# Patient Record
Sex: Male | Born: 1960 | Race: White | Hispanic: No | Marital: Married | Smoking: Current every day smoker
Health system: Southern US, Community
[De-identification: ages and names within clinical notes are randomized; demographics above are authoritative.]

## PROBLEM LIST (undated history)

## (undated) DIAGNOSIS — J449 Chronic obstructive pulmonary disease, unspecified: Secondary | ICD-10-CM

## (undated) DIAGNOSIS — M1990 Unspecified osteoarthritis, unspecified site: Secondary | ICD-10-CM

## (undated) DIAGNOSIS — I2510 Atherosclerotic heart disease of native coronary artery without angina pectoris: Secondary | ICD-10-CM

## (undated) DIAGNOSIS — K219 Gastro-esophageal reflux disease without esophagitis: Secondary | ICD-10-CM

## (undated) DIAGNOSIS — E119 Type 2 diabetes mellitus without complications: Secondary | ICD-10-CM

## (undated) HISTORY — PX: JOINT REPLACEMENT: SHX530

---
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
## 2016-12-29 DIAGNOSIS — M25552 Pain in left hip: Secondary | ICD-10-CM | POA: Diagnosis not present

## 2018-01-06 DIAGNOSIS — S300XXA Contusion of lower back and pelvis, initial encounter: Secondary | ICD-10-CM | POA: Diagnosis not present

## 2018-01-06 DIAGNOSIS — S3992XA Unspecified injury of lower back, initial encounter: Secondary | ICD-10-CM | POA: Diagnosis not present

## 2018-01-21 DIAGNOSIS — E109 Type 1 diabetes mellitus without complications: Secondary | ICD-10-CM | POA: Diagnosis not present

## 2018-03-01 DIAGNOSIS — M25559 Pain in unspecified hip: Secondary | ICD-10-CM | POA: Diagnosis not present

## 2018-03-01 DIAGNOSIS — E109 Type 1 diabetes mellitus without complications: Secondary | ICD-10-CM | POA: Diagnosis not present

## 2018-03-01 DIAGNOSIS — K210 Gastro-esophageal reflux disease with esophagitis: Secondary | ICD-10-CM | POA: Diagnosis not present

## 2018-03-01 DIAGNOSIS — E119 Type 2 diabetes mellitus without complications: Secondary | ICD-10-CM | POA: Diagnosis not present

## 2018-03-01 DIAGNOSIS — J309 Allergic rhinitis, unspecified: Secondary | ICD-10-CM | POA: Diagnosis not present

## 2018-05-28 ENCOUNTER — Encounter (HOSPITAL_COMMUNITY): Payer: Self-pay | Admitting: Cardiology

## 2018-05-28 ENCOUNTER — Inpatient Hospital Stay (HOSPITAL_COMMUNITY)
Admission: AD | Admit: 2018-05-28 | Discharge: 2018-05-30 | DRG: 287 | Disposition: A | Discharge status: Home / Self Care | Payer: Medicaid Other | Source: Other Acute Inpatient Hospital | Attending: Cardiology | Admitting: Cardiology

## 2018-05-28 ENCOUNTER — Other Ambulatory Visit: Payer: Self-pay

## 2018-05-28 DIAGNOSIS — E119 Type 2 diabetes mellitus without complications: Secondary | ICD-10-CM

## 2018-05-28 DIAGNOSIS — Z8249 Family history of ischemic heart disease and other diseases of the circulatory system: Secondary | ICD-10-CM | POA: Diagnosis not present

## 2018-05-28 DIAGNOSIS — D72829 Elevated white blood cell count, unspecified: Secondary | ICD-10-CM | POA: Diagnosis not present

## 2018-05-28 DIAGNOSIS — Z79891 Long term (current) use of opiate analgesic: Secondary | ICD-10-CM

## 2018-05-28 DIAGNOSIS — I200 Unstable angina: Secondary | ICD-10-CM | POA: Diagnosis present

## 2018-05-28 DIAGNOSIS — R079 Chest pain, unspecified: Secondary | ICD-10-CM | POA: Diagnosis not present

## 2018-05-28 DIAGNOSIS — Z79899 Other long term (current) drug therapy: Secondary | ICD-10-CM | POA: Diagnosis not present

## 2018-05-28 DIAGNOSIS — I249 Acute ischemic heart disease, unspecified: Secondary | ICD-10-CM

## 2018-05-28 DIAGNOSIS — I25110 Atherosclerotic heart disease of native coronary artery with unstable angina pectoris: Secondary | ICD-10-CM | POA: Diagnosis not present

## 2018-05-28 DIAGNOSIS — Z794 Long term (current) use of insulin: Secondary | ICD-10-CM

## 2018-05-28 DIAGNOSIS — I2510 Atherosclerotic heart disease of native coronary artery without angina pectoris: Secondary | ICD-10-CM | POA: Diagnosis not present

## 2018-05-28 DIAGNOSIS — K219 Gastro-esophageal reflux disease without esophagitis: Secondary | ICD-10-CM | POA: Diagnosis not present

## 2018-05-28 DIAGNOSIS — R0789 Other chest pain: Secondary | ICD-10-CM | POA: Diagnosis not present

## 2018-05-28 DIAGNOSIS — E871 Hypo-osmolality and hyponatremia: Secondary | ICD-10-CM

## 2018-05-28 DIAGNOSIS — R51 Headache: Secondary | ICD-10-CM | POA: Diagnosis not present

## 2018-05-28 DIAGNOSIS — IMO0001 Reserved for inherently not codable concepts without codable children: Secondary | ICD-10-CM

## 2018-05-28 DIAGNOSIS — E1165 Type 2 diabetes mellitus with hyperglycemia: Secondary | ICD-10-CM | POA: Diagnosis present

## 2018-05-28 HISTORY — DX: Type 2 diabetes mellitus without complications: E11.9

## 2018-05-28 HISTORY — DX: Gastro-esophageal reflux disease without esophagitis: K21.9

## 2018-05-28 HISTORY — DX: Unstable angina: I20.0

## 2018-05-28 HISTORY — DX: Atherosclerotic heart disease of native coronary artery without angina pectoris: I25.10

## 2018-05-28 LAB — CBC
HCT: 44.1 % (ref 39.0–52.0)
Hemoglobin: 15.5 g/dL (ref 13.0–17.0)
MCH: 32.4 pg (ref 26.0–34.0)
MCHC: 35.1 g/dL (ref 30.0–36.0)
MCV: 92.1 fL (ref 80.0–100.0)
Platelets: 297 10*3/uL (ref 150–400)
RBC: 4.79 MIL/uL (ref 4.22–5.81)
RDW: 12.8 % (ref 11.5–15.5)
WBC: 10.2 10*3/uL (ref 4.0–10.5)
nRBC: 0 % (ref 0.0–0.2)

## 2018-05-28 LAB — GLUCOSE, CAPILLARY
Glucose-Capillary: 134 mg/dL — ABNORMAL HIGH (ref 70–99)
Glucose-Capillary: 263 mg/dL — ABNORMAL HIGH (ref 70–99)

## 2018-05-28 LAB — TROPONIN I: Troponin I: <0.03 ng/mL (ref <0.03)

## 2018-05-28 LAB — COMPREHENSIVE METABOLIC PANEL
ALT: 13 U/L (ref 0–44)
AST: 15 U/L (ref 15–41)
Albumin: 3.4 g/dL — ABNORMAL LOW (ref 3.5–5.0)
Alkaline Phosphatase: 78 U/L (ref 38–126)
Anion gap: 11 (ref 5–15)
BUN: 9 mg/dL (ref 6–20)
CO2: 20 mmol/L — ABNORMAL LOW (ref 22–32)
Calcium: 8.8 mg/dL — ABNORMAL LOW (ref 8.9–10.3)
Chloride: 101 mmol/L (ref 98–111)
Creatinine, Ser: 1.09 mg/dL (ref 0.61–1.24)
GFR calc Af Amer: >60 mL/min (ref >60)
GFR calc non Af Amer: >60 mL/min (ref >60)
Glucose, Bld: 145 mg/dL — ABNORMAL HIGH (ref 70–99)
Potassium: 4.1 mmol/L (ref 3.5–5.1)
Sodium: 132 mmol/L — ABNORMAL LOW (ref 135–145)
Total Bilirubin: 1.1 mg/dL (ref 0.3–1.2)
Total Protein: 6.6 g/dL (ref 6.5–8.1)

## 2018-05-28 LAB — HEMOGLOBIN A1C
Hgb A1c MFr Bld: 8.1 % — ABNORMAL HIGH (ref 4.8–5.6)
Mean Plasma Glucose: 185.77 mg/dL

## 2018-05-28 LAB — MAGNESIUM: Magnesium: 1.9 mg/dL (ref 1.7–2.4)

## 2018-05-28 LAB — PROTIME-INR
INR: 1.01
Prothrombin Time: 13.2 seconds (ref 11.4–15.2)

## 2018-05-28 LAB — BRAIN NATRIURETIC PEPTIDE: B Natriuretic Peptide: 25 pg/mL (ref 0.0–100.0)

## 2018-05-28 MED ORDER — PANTOPRAZOLE SODIUM 40 MG PO TBEC
40.0000 mg | DELAYED_RELEASE_TABLET | Freq: Every day | ORAL | Status: DC
  Administered 2018-05-29 – 2018-05-30 (×2): 40 mg via ORAL
  Filled 2018-05-28 (×3): qty 1

## 2018-05-28 MED ORDER — MORPHINE SULFATE (PF) 2 MG/ML IV SOLN
2.0000 mg | INTRAVENOUS | Status: DC | PRN
  Administered 2018-05-28 – 2018-05-30 (×10): 2 mg via INTRAVENOUS
  Filled 2018-05-28 (×10): qty 1

## 2018-05-28 MED ORDER — ATORVASTATIN CALCIUM 80 MG PO TABS
80.0000 mg | ORAL_TABLET | Freq: Every day | ORAL | Status: DC
  Administered 2018-05-28 – 2018-05-30 (×3): 80 mg via ORAL
  Filled 2018-05-28 (×3): qty 1

## 2018-05-28 MED ORDER — NITROGLYCERIN IN D5W 200-5 MCG/ML-% IV SOLN
2.0000 ug/min | INTRAVENOUS | Status: DC
  Administered 2018-05-28: 35 ug/min via INTRAVENOUS
  Administered 2018-05-28: 30 ug/min via INTRAVENOUS
  Administered 2018-05-29: 35 ug/min via INTRAVENOUS
  Filled 2018-05-28: qty 250

## 2018-05-28 MED ORDER — ATORVASTATIN CALCIUM 80 MG PO TABS: 80.0000 mg | ORAL_TABLET | Freq: Every day | ORAL | Status: DC

## 2018-05-28 MED ORDER — OXYCODONE HCL 5 MG PO TABS: 5.0000 mg | ORAL_TABLET | ORAL | Status: DC | PRN

## 2018-05-28 MED ORDER — NITROGLYCERIN 0.4 MG SL SUBL: 0.4000 mg | SUBLINGUAL_TABLET | SUBLINGUAL | Status: DC | PRN

## 2018-05-28 MED ORDER — ASPIRIN EC 81 MG PO TBEC
81.0000 mg | DELAYED_RELEASE_TABLET | Freq: Every day | ORAL | Status: DC
  Administered 2018-05-29 – 2018-05-30 (×2): 81 mg via ORAL
  Filled 2018-05-28 (×3): qty 1

## 2018-05-28 MED ORDER — INSULIN ASPART 100 UNIT/ML ~~LOC~~ SOLN
4.0000 [IU] | Freq: Three times a day (TID) | SUBCUTANEOUS | Status: DC
  Administered 2018-05-29 (×2): 4 [IU] via SUBCUTANEOUS

## 2018-05-28 MED ORDER — HEPARIN (PORCINE) 25000 UT/250ML-% IV SOLN
1300.0000 [IU]/h | INTRAVENOUS | Status: DC
  Administered 2018-05-28 – 2018-05-29 (×2): 1000 [IU]/h via INTRAVENOUS
  Administered 2018-05-30: 1200 [IU]/h via INTRAVENOUS
  Filled 2018-05-28 (×2): qty 250

## 2018-05-28 MED ORDER — ONDANSETRON HCL 4 MG/2ML IJ SOLN: 4.0000 mg | Freq: Four times a day (QID) | INTRAMUSCULAR | Status: DC | PRN

## 2018-05-28 MED ORDER — INSULIN GLARGINE 100 UNIT/ML ~~LOC~~ SOLN
18.0000 [IU] | Freq: Every day | SUBCUTANEOUS | Status: DC
  Administered 2018-05-28 – 2018-05-29 (×2): 18 [IU] via SUBCUTANEOUS
  Filled 2018-05-28 (×3): qty 0.18

## 2018-05-28 MED ORDER — INSULIN ASPART 100 UNIT/ML ~~LOC~~ SOLN
0.0000 [IU] | Freq: Every day | SUBCUTANEOUS | Status: DC
  Administered 2018-05-28: 3 [IU] via SUBCUTANEOUS

## 2018-05-28 MED ORDER — METOPROLOL TARTRATE 25 MG PO TABS
25.0000 mg | ORAL_TABLET | Freq: Two times a day (BID) | ORAL | Status: DC
  Administered 2018-05-29 – 2018-05-30 (×3): 25 mg via ORAL
  Filled 2018-05-28 (×3): qty 1

## 2018-05-28 MED ORDER — ACETAMINOPHEN 325 MG PO TABS
650.0000 mg | ORAL_TABLET | ORAL | Status: DC | PRN
  Administered 2018-05-29 (×2): 650 mg via ORAL
  Filled 2018-05-28 (×2): qty 2

## 2018-05-28 MED ORDER — HEPARIN BOLUS VIA INFUSION
4000.0000 [IU] | Freq: Once | INTRAVENOUS | Status: AC
  Administered 2018-05-28: 4000 [IU] via INTRAVENOUS
  Filled 2018-05-28: qty 4000

## 2018-05-28 MED ORDER — INSULIN ASPART 100 UNIT/ML ~~LOC~~ SOLN
0.0000 [IU] | Freq: Three times a day (TID) | SUBCUTANEOUS | Status: DC
  Administered 2018-05-29: 11 [IU] via SUBCUTANEOUS
  Administered 2018-05-29: 8 [IU] via SUBCUTANEOUS
  Administered 2018-05-29: 2 [IU] via SUBCUTANEOUS
  Administered 2018-05-30: 5 [IU] via SUBCUTANEOUS

## 2018-05-28 NOTE — H&P (Signed)
Cardiology Admission History and Physical:   Patient ID: Gabriel Gomez MRN: [id number]; DOB: 11/01/1960   Admission date: 05/28/2018  Primary Care Provider: Patient, No Pcp Per Primary Cardiologist: None  Chief Complaint:  Chest pain  Patient Profile:   Gabriel Gomez is a 58 y.o. male with a history of CAD, DM, GERD who presents with chest pain concerning for UA.   History of Present Illness:   Gabriel Gomez is a 58 y.o. male with a history of CAD, DM, GERD who presents with chest pain concerning for UA.   The patient has a history of CAD and had LHC 10 years ago that showed "90% blockage" that was medically managed without intervention. He has not followed with a cardiologist since. He reports that he has been experiencing intermittent exertional chest pain with dyspnea over the past several months. He was otherwise at his baseline until yesterday, when he developed L sided chest pressure when walking to the mailbox. He also experienced tingling in his L face, but denied diaphoreses, nausea, edema or other symptoms. His symptoms improved but then woke him from sleep this morning, so he came to the ED today.   He presented to the [facility] ED, where he had BP 159/90, HR 88. ECG showed NSR with septal Q waves and nonspecific ST changes. Labs included WBC 11, Cr 0.8, BNP 113, negative troponin. CXR and head CT were performed and were unremarkable. He continued to have chest pain at the OSH, so NTG gtt and heparin gtt and was started due to concern for UA. He was transferred to [facility] for further care.   On arrival to [facility], pt with BP 105/69. ECG showed NSR with LAD, septal Q waves, poor R wave progression and nonspecific ST changes.  He reported mild ongoing chest pain on 25 mcg of NTG. He states that the tingling in his face and arm are still present but improved. He denies other complaints.   Of note, he recently had hip surgery, which he tolerated well. He is on oxycodone for  ongoing pain control.   Past Medical History:  Diagnosis Date  . Coronary artery disease   . Diabetes mellitus without complication ([place])   . GERD (gastroesophageal reflux disease)        Medications Prior to Admission: Prior to Admission medications   Medication Sig Start Date End Date Taking? Authorizing Provider  insulin aspart (NOVOLOG) 100 UNIT/ML injection Inject into the skin 3 (three) times daily before meals.   Yes [provider]  insulin glargine (LANTUS) 100 UNIT/ML injection Inject 25 Units into the skin at bedtime.   Yes [provider]  omeprazole (PRILOSEC) 20 MG capsule Take 20 mg by mouth daily.   Yes [provider]  oxyCODONE (OXY IR/ROXICODONE) 5 MG immediate release tablet Take 10 mg by mouth 2 (two) times daily as needed for severe pain.   Yes [provider]     Allergies:   Allergies not on file  Social History:   Social History   Socioeconomic History  . Marital status: Married    Spouse name: Not on file  . Number of children: Not on file  . Years of education: Not on file  . Highest education level: Not on file  Occupational History  . Not on file  Social Needs  . Financial resource strain: Not on file  . Food insecurity:    Worry: Not on file    Inability: Not on file  . Transportation needs:  Medical: Not on file    Non-medical: Not on file  Tobacco Use  . Smoking status: Current Every Day Smoker  Substance and Sexual Activity  . Alcohol use: Not on file  . Drug use: Not on file  . Sexual activity: Not on file  Lifestyle  . Physical activity:    Days per week: Not on file    Minutes per session: Not on file  . Stress: Not on file  Relationships  . Social connections:    Talks on phone: Not on file    Gets together: Not on file    Attends religious service: Not on file    Active member of club or organization: Not on file    Attends meetings of clubs or organizations: Not on file     Relationship status: Not on file  . Intimate partner violence:    Fear of current or ex partner: Not on file    Emotionally abused: Not on file    Physically abused: Not on file    Forced sexual activity: Not on file  Other Topics Concern  . Not on file  Social History Narrative  . Not on file    Family History:   The patient's family history includes CAD in his father.  Diagnosed in his 74s  ROS:  Please see the history of present illness.  All other ROS reviewed and negative.     Physical Exam/Data:   Vitals:   05/28/18 1702  BP: 105/69  Pulse: 81  Temp: 98 F (36.7 C)  TempSrc: Oral  SpO2: 99%  Weight: 84.1 kg  Height: 5\' 9"  (1.753 m)   No intake or output data in the 24 hours ending 05/28/18 1820 Last 3 Weights 05/28/2018  Weight (lbs) 185 lb 6.5 oz  Weight (kg) 84.1 kg     Body mass index is 27.38 kg/m.  General:  Well nourished, well developed, in no acute distress HEENT: normal Neck: no JVD Cardiac:  normal S1, S2; RRR; no murmur  Lungs:  clear to auscultation bilaterally, no wheezing, rhonchi or rales  Abd: soft, nontender, no hepatomegaly  Ext: no edema Musculoskeletal:  No deformities, BUE and BLE strength normal and equal Skin: warm and dry  Neuro:  Equal sensation of bilateral face. Normal strength and sensation in UE bilaterally. No focal abnormalities noted Psych:  Normal affect    EKG:  The ECG that was done and was personally reviewed and demonstrates NSR with LAD, septal Q waves, poor R wave progression and nonspecific ST changes  Relevant CV Studies: None in our system  Laboratory Data:  ChemistryNo results for input(s): NA, K, CL, CO2, GLUCOSE, BUN, CREATININE, CALCIUM, GFRNONAA, GFRAA, ANIONGAP in the last 168 hours.  No results for input(s): PROT, ALBUMIN, AST, ALT, ALKPHOS, BILITOT in the last 168 hours. Hematology Recent Labs  Lab 05/28/18 1737  WBC 10.2  RBC 4.79  HGB 15.5  HCT 44.1  MCV 92.1  MCH 32.4  MCHC 35.1  RDW 12.8    PLT 297   Cardiac EnzymesNo results for input(s): TROPONINI in the last 168 hours. No results for input(s): TROPIPOC in the last 168 hours.  BNPNo results for input(s): BNP, PROBNP in the last 168 hours.  DDimer No results for input(s): DDIMER in the last 168 hours.  Radiology/Studies:  No results found.  Assessment and Plan:   CAD Chest pain The patient has a history of CAD based on LHC many years ago. He has experienced progressive exertional  angina over the past months. He now has an episode of more severe discomfort that has not been relieved with rest and is incompletely relieved with NTG. ECG shows no acute findings, and initial troponin is negative. His presentation is concerning for unstable angina, and he has been started on heparin and NTG infusions. He will need repeat LHC. -Continue to trend troponin -Lipid panel, HgA1c ordered -Echocardiogram ordered -Continue ASA 81 mg daily -Starting atorvastatin 80 mg daily -Continue heparin gtt -Continue NTG infusion. Titrate and add supplemental analgesics to get CP of zero.  -Anticipate LHC Monday or sooner if CP not resolved -Will need smoking cessation counseling  Facial/arm tingling Likely related to chest pain. Head CT at OSH without acute findings. No deficits on my neuro exam. -Continue to monitor  DM On glargine 25 QHS and 10-15 units aspart with breakfast +/-  Lunch at home. -HgA1c ordered. -Giving reduced dose glargine + aspart + SSI  GERD -Continue PPI   Severity of Illness: The appropriate patient status for this patient is INPATIENT. Inpatient status is judged to be reasonable and necessary in order to provide the required intensity of service to ensure the patient's safety. The patient's presenting symptoms, physical exam findings, and initial radiographic and laboratory data in the context of their chronic comorbidities is felt to place them at high risk for further clinical deterioration. Furthermore, it is  not anticipated that the patient will be medically stable for discharge from the hospital within 2 midnights of admission. The following factors support the patient status of inpatient.   " The patient's presenting symptoms include chest pain. " The worrisome physical exam findings include n/a. " The initial radiographic and laboratory data are worrisome because of n/a. " The chronic co-morbidities include CAD, DM.   * I certify that at the point of admission it is my clinical judgment that the patient will require inpatient hospital care spanning beyond 2 midnights from the point of admission due to high intensity of service, high risk for further deterioration and high frequency of surveillance required.*    For questions or updates, please contact [place] Please consult [email or web address] for contact info under        Signed, Nila Nephew, MD  05/28/2018 6:20 PM

## 2018-05-28 NOTE — Plan of Care (Signed)
  Problem: Education: Goal: Knowledge of General Education information will improve Description Including pain rating scale, medication(s)/side effects and non-pharmacologic comfort measures Outcome: Progressing   Problem: Activity: Goal: Risk for activity intolerance will decrease Outcome: Progressing   Problem: Clinical Measurements: Goal: Respiratory complications will improve Outcome: Completed/Met   

## 2018-05-28 NOTE — Progress Notes (Signed)
ANTICOAGULATION CONSULT NOTE - Initial Consult  Pharmacy Consult for Heparin Indication: chest pain/ACS  Allergies not on file  Patient Measurements: Height: 5\' 9"  (175.3 cm) Weight: 185 lb 6.5 oz (84.1 kg) IBW/kg (Calculated) : 70.7 Heparin Dosing Weight: 84.1 kg  Vital Signs: Temp: 98 F (36.7 C) (01/18 1702) Temp Source: Oral (01/18 1702) BP: 105/69 (01/18 1702) Pulse Rate: 81 (01/18 1702)  Labs: Recent Labs    05/28/18 1737  HGB 15.5  HCT 44.1  PLT 297  LABPROT 13.2  INR 1.01   CrCl cannot be calculated (No successful lab value found.).  Assessment: 57 yoM with PMH s/f CAD with LHC ~10 years ago that showed "90% blockage" that was medically managed without intervention, presenting with progressive exertional chest pain concerning for unstable angina. Pharmacy has been consulted for IV heparin dosing. No AC PTA. CBC WNL, no bleeding noted. Anticipate LHC Monday or sooner if chest pain not resolved per cardiology.  Goal of Therapy:  Heparin level 0.3-0.7 units/ml Monitor platelets by anticoagulation protocol: Yes   Plan:  Heparin 4000 unit bolus IV x1 Start heparin gtt at 1000 units/hr Check heparin level in 6 hours Daily heparin level and CBC Monitor s/sx of bleeding  Erin N. Gerarda Fraction, PharmD, [place] PGY2 Infectious Diseases Pharmacy Resident Phone: [phone] 05/28/2018,6:34 PM

## 2018-05-29 DIAGNOSIS — D72829 Elevated white blood cell count, unspecified: Secondary | ICD-10-CM

## 2018-05-29 DIAGNOSIS — IMO0001 Reserved for inherently not codable concepts without codable children: Secondary | ICD-10-CM

## 2018-05-29 DIAGNOSIS — Z794 Long term (current) use of insulin: Secondary | ICD-10-CM

## 2018-05-29 DIAGNOSIS — E871 Hypo-osmolality and hyponatremia: Secondary | ICD-10-CM

## 2018-05-29 DIAGNOSIS — E119 Type 2 diabetes mellitus without complications: Secondary | ICD-10-CM

## 2018-05-29 DIAGNOSIS — K219 Gastro-esophageal reflux disease without esophagitis: Secondary | ICD-10-CM | POA: Diagnosis present

## 2018-05-29 DIAGNOSIS — I200 Unstable angina: Secondary | ICD-10-CM

## 2018-05-29 HISTORY — DX: Elevated white blood cell count, unspecified: D72.829

## 2018-05-29 HISTORY — DX: Hypo-osmolality and hyponatremia: E87.1

## 2018-05-29 LAB — BASIC METABOLIC PANEL
ANION GAP: 12 (ref 5–15)
BUN: 14 mg/dL (ref 6–20)
CO2: 20 mmol/L — ABNORMAL LOW (ref 22–32)
Calcium: 8.6 mg/dL — ABNORMAL LOW (ref 8.9–10.3)
Chloride: 96 mmol/L — ABNORMAL LOW (ref 98–111)
Creatinine, Ser: 1.15 mg/dL (ref 0.61–1.24)
GFR calc Af Amer: >60 mL/min (ref >60)
GFR calc non Af Amer: >60 mL/min (ref >60)
GLUCOSE: 288 mg/dL — AB (ref 70–99)
Potassium: 4.6 mmol/L (ref 3.5–5.1)
Sodium: 128 mmol/L — ABNORMAL LOW (ref 135–145)

## 2018-05-29 LAB — HEPARIN LEVEL (UNFRACTIONATED)
Heparin Unfractionated: 0.32 IU/mL (ref 0.30–0.70)
Heparin Unfractionated: 0.18 IU/mL — ABNORMAL LOW (ref 0.30–0.70)

## 2018-05-29 LAB — GLUCOSE, CAPILLARY
Glucose-Capillary: 266 mg/dL — ABNORMAL HIGH (ref 70–99)
Glucose-Capillary: 319 mg/dL — ABNORMAL HIGH (ref 70–99)
Glucose-Capillary: 144 mg/dL — ABNORMAL HIGH (ref 70–99)
Glucose-Capillary: 198 mg/dL — ABNORMAL HIGH (ref 70–99)

## 2018-05-29 LAB — LIPID PANEL
CHOL/HDL RATIO: 4.9 ratio
Cholesterol: 199 mg/dL (ref 0–200)
HDL: 41 mg/dL (ref >40)
LDL Cholesterol: 129 mg/dL — ABNORMAL HIGH (ref 0–99)
Triglycerides: 144 mg/dL (ref <150)
VLDL: 29 mg/dL (ref 0–40)

## 2018-05-29 LAB — TROPONIN I
Troponin I: <0.03 ng/mL (ref <0.03)
Troponin I: <0.03 ng/mL (ref <0.03)
Troponin I: <0.03 ng/mL (ref <0.03)

## 2018-05-29 LAB — CBC
HCT: 42.6 % (ref 39.0–52.0)
Hemoglobin: 14.9 g/dL (ref 13.0–17.0)
MCH: 32.5 pg (ref 26.0–34.0)
MCHC: 35 g/dL (ref 30.0–36.0)
MCV: 93 fL (ref 80.0–100.0)
Platelets: 290 10*3/uL (ref 150–400)
RBC: 4.58 MIL/uL (ref 4.22–5.81)
RDW: 13 % (ref 11.5–15.5)
WBC: 12.7 10*3/uL — ABNORMAL HIGH (ref 4.0–10.5)
nRBC: 0 % (ref 0.0–0.2)

## 2018-05-29 LAB — HIV ANTIBODY (ROUTINE TESTING W REFLEX): HIV Screen 4th Generation wRfx: NONREACTIVE

## 2018-05-29 MED ORDER — NICOTINE 14 MG/24HR TD PT24
14.0000 mg | MEDICATED_PATCH | Freq: Every day | TRANSDERMAL | Status: DC
  Administered 2018-05-29: 14 mg via TRANSDERMAL
  Filled 2018-05-29 (×2): qty 1

## 2018-05-29 MED ORDER — SODIUM CHLORIDE 0.9 % IV SOLN: 250.0000 mL | INTRAVENOUS | Status: DC | PRN

## 2018-05-29 MED ORDER — SODIUM CHLORIDE 0.9% FLUSH: 3.0000 mL | INTRAVENOUS | Status: DC | PRN

## 2018-05-29 MED ORDER — SODIUM CHLORIDE 0.9% FLUSH
3.0000 mL | Freq: Two times a day (BID) | INTRAVENOUS | Status: DC
  Administered 2018-05-29 – 2018-05-30 (×2): 3 mL via INTRAVENOUS

## 2018-05-29 MED ORDER — SODIUM CHLORIDE 0.9 % IV SOLN
INTRAVENOUS | Status: DC
  Administered 2018-05-29: 18:00:00 via INTRAVENOUS

## 2018-05-29 MED ORDER — SODIUM CHLORIDE 0.9 % IV SOLN
INTRAVENOUS | Status: AC
  Administered 2018-05-29 – 2018-05-30 (×2): via INTRAVENOUS

## 2018-05-29 NOTE — Progress Notes (Signed)
DAILY PROGRESS NOTE   Patient Name: Gabriel Gomez Date of Encounter: 05/29/2018  Chief Complaint   Chest pain improved  Patient Profile   Gabriel Gomez is a 58 y.o. male with a history of CAD, DM, GERD who presents with chest pain concerning for UA.  Subjective   Chest pain improved, but not completely resolved overnight. Troponin negative x 3. BNP negative. Sodium and chloride low today - he reports facial numbness has improved. Head CT report from [facility] was negative for acute process - no sinusitis, but reports chronic sinus drainage. Mild leukocytosis today.  Objective   Vitals:   05/28/18 1702 05/28/18 1955 05/29/18 0030 05/29/18 0556  BP: 105/69 109/75 110/74 119/61  Pulse: 81 78  77  Resp:  14  17  Temp: 98 F (36.7 C) (!) 97.4 F (36.3 C)  97.6 F (36.4 C)  TempSrc: Oral Oral  Oral  SpO2: 99% 95%  93%  Weight: 84.1 kg   84.1 kg  Height: 5\' 9"  (1.753 m)       Intake/Output Summary (Last 24 hours) at 05/29/2018 7425 Last data filed at 05/29/2018 0400 Gross per 24 hour  Intake 534.5 ml  Output 350 ml  Net 184.5 ml   Filed Weights   05/28/18 1702 05/29/18 0556  Weight: 84.1 kg 84.1 kg    Physical Exam   General appearance: alert and no distress Neck: no carotid bruit, no JVD and thyroid not enlarged, symmetric, no tenderness/mass/nodules Lungs: clear to auscultation bilaterally Heart: regular rate and rhythm Abdomen: soft, non-tender; bowel sounds normal; no masses,  no organomegaly Extremities: extremities normal, atraumatic, no cyanosis or edema Pulses: 2+ and symmetric Skin: Skin color, texture, turgor normal. No rashes or lesions Neurologic: Grossly normal Psych: Pleasant  Inpatient Medications    Scheduled Meds: . aspirin EC  81 mg Oral Daily  . atorvastatin  80 mg Oral q1800  . insulin aspart  0-15 Units Subcutaneous TID WC  . insulin aspart  0-5 Units Subcutaneous QHS  . insulin aspart  4 Units Subcutaneous TID WC  . insulin  glargine  18 Units Subcutaneous QHS  . metoprolol tartrate  25 mg Oral BID  . pantoprazole  40 mg Oral Daily    Continuous Infusions: . heparin 1,000 Units/hr (05/29/18 0329)  . nitroGLYCERIN 35 mcg/min (05/29/18 0329)    PRN Meds: acetaminophen, morphine injection, nitroGLYCERIN, ondansetron (ZOFRAN) IV   Labs   Results for orders placed or performed during the hospital encounter of 05/28/18 (from the past 48 hour(s))  Glucose, capillary     Status: Abnormal   Collection Time: 05/28/18  4:59 PM  Result Value Ref Range   Glucose-Capillary 134 (H) 70 - 99 mg/dL  Comprehensive metabolic panel     Status: Abnormal   Collection Time: 05/28/18  5:37 PM  Result Value Ref Range   Sodium 132 (L) 135 - 145 mmol/L   Potassium 4.1 3.5 - 5.1 mmol/L   Chloride 101 98 - 111 mmol/L   CO2 20 (L) 22 - 32 mmol/L   Glucose, Bld 145 (H) 70 - 99 mg/dL   BUN 9 6 - 20 mg/dL   Creatinine, Ser 1.09 0.61 - 1.24 mg/dL   Calcium 8.8 (L) 8.9 - 10.3 mg/dL   Total Protein 6.6 6.5 - 8.1 g/dL   Albumin 3.4 (L) 3.5 - 5.0 g/dL   AST 15 15 - 41 U/L   ALT 13 0 - 44 U/L   Alkaline Phosphatase 78 38 - 126 U/L  Total Bilirubin 1.1 0.3 - 1.2 mg/dL   GFR calc non Af Amer >60 >60 mL/min   GFR calc Af Amer >60 >60 mL/min   Anion gap 11 5 - 15    Comment: Performed at [facility] [address] [place] [address]  CBC     Status: None   Collection Time: 05/28/18  5:37 PM  Result Value Ref Range   WBC 10.2 4.0 - 10.5 K/uL   RBC 4.79 4.22 - 5.81 MIL/uL   Hemoglobin 15.5 13.0 - 17.0 g/dL   HCT 44.1 39.0 - 52.0 %   MCV 92.1 80.0 - 100.0 fL   MCH 32.4 26.0 - 34.0 pg   MCHC 35.1 30.0 - 36.0 g/dL   RDW 12.8 11.5 - 15.5 %   Platelets 297 150 - 400 K/uL   nRBC 0.0 0.0 - 0.2 %    Comment: Performed at [facility], [place] [address] [place], [place] [address]  Troponin I - Now Then Q6H     Status: None   Collection Time: 05/28/18  5:37 PM  Result Value Ref Range   Troponin I <0.03 <0.03  ng/mL    Comment: Performed at [facility] [address], [place], [place] [address]  Protime-INR     Status: None   Collection Time: 05/28/18  5:37 PM  Result Value Ref Range   Prothrombin Time 13.2 11.4 - 15.2 seconds   INR 1.01     Comment: Performed at [facility], [place] [address], [place], [place] [address]  Magnesium     Status: None   Collection Time: 05/28/18  5:37 PM  Result Value Ref Range   Magnesium 1.9 1.7 - 2.4 mg/dL    Comment: Performed at [facility], [place] [address] [place], [place] [address]  Brain natriuretic peptide     Status: None   Collection Time: 05/28/18  5:37 PM  Result Value Ref Range   B Natriuretic Peptide 25.0 0.0 - 100.0 pg/mL    Comment: Performed at [facility] [address] [place], [place] [address]  Hemoglobin A1c     Status: Abnormal   Collection Time: 05/28/18  5:37 PM  Result Value Ref Range   Hgb A1c MFr Bld 8.1 (H) 4.8 - 5.6 %    Comment: (NOTE) Pre diabetes:          5.7%-6.4% Diabetes:              >6.4% Glycemic control for   <7.0% adults with diabetes    Mean Plasma Glucose 185.77 mg/dL    Comment: Performed at [facility] [address] [place], [place] [address]  Glucose, capillary     Status: Abnormal   Collection Time: 05/28/18  8:58 PM  Result Value Ref Range   Glucose-Capillary 263 (H) 70 - 99 mg/dL  Troponin I - Now Then Q6H     Status: None   Collection Time: 05/28/18 11:34 PM  Result Value Ref Range   Troponin I <0.03 <0.03 ng/mL    Comment: Performed at [facility] [address], [place], [place] [address]  Troponin I - Now Then Q6H     Status: None   Collection Time: 05/29/18  3:51 AM  Result Value Ref Range   Troponin I <0.03 <0.03 ng/mL    Comment: Performed at [facility] [address], [place],  [address]  Basic  metabolic panel     Status: Abnormal   Collection Time: 05/29/18  3:51 AM  Result Value Ref Range   Sodium 128 (L) 135 - 145 mmol/L    Potassium 4.6 3.5 - 5.1 mmol/L   Chloride 96 (L) 98 - 111 mmol/L   CO2 20 (L) 22 - 32 mmol/L   Glucose, Bld 288 (H) 70 - 99 mg/dL   BUN 14 6 - 20 mg/dL   Creatinine, Ser 1.15 0.61 - 1.24 mg/dL   Calcium 8.6 (L) 8.9 - 10.3 mg/dL   GFR calc non Af Amer >60 >60 mL/min   GFR calc Af Amer >60 >60 mL/min   Anion gap 12 5 - 15    Comment: Performed at [facility] [address] [place], [place] [address]  Lipid panel     Status: Abnormal   Collection Time: 05/29/18  3:51 AM  Result Value Ref Range   Cholesterol 199 0 - 200 mg/dL   Triglycerides 144 <150 mg/dL   HDL 41 >40 mg/dL   Total CHOL/HDL Ratio 4.9 RATIO   VLDL 29 0 - 40 mg/dL   LDL Cholesterol 129 (H) 0 - 99 mg/dL    Comment:        Total Cholesterol/HDL:CHD Risk Coronary Heart Disease Risk Table                     Men   Women  1/2 Average Risk   3.4   3.3  Average Risk       5.0   4.4  2 X Average Risk   9.6   7.1  3 X Average Risk  23.4   11.0        Use the calculated Patient Ratio above and the CHD Risk Table to determine the patient's CHD Risk.        ATP III CLASSIFICATION (LDL):  <100     mg/dL   Optimal  100-129  mg/dL   Near or Above                    Optimal  130-159  mg/dL   Borderline  160-189  mg/dL   High  >190     mg/dL   Very High Performed at [facility] [address], [place], [place] [address]   Heparin level (unfractionated)     Status: None   Collection Time: 05/29/18  3:51 AM  Result Value Ref Range   Heparin Unfractionated 0.32 0.30 - 0.70 IU/mL    Comment: (NOTE) If heparin results are below expected values, and patient dosage has  been confirmed, suggest follow up testing of antithrombin III levels. Performed at [facility], [place] [address] [place], [place] [address]   CBC     Status: Abnormal   Collection Time: 05/29/18  3:51 AM  Result Value Ref Range   WBC 12.7 (H) 4.0 - 10.5 K/uL   RBC 4.58 4.22 - 5.81 MIL/uL   Hemoglobin 14.9 13.0 - 17.0 g/dL   HCT 42.6  39.0 - 52.0 %   MCV 93.0 80.0 - 100.0 fL   MCH 32.5 26.0 - 34.0 pg   MCHC 35.0 30.0 - 36.0 g/dL   RDW 13.0 11.5 - 15.5 %   Platelets 290 150 - 400 K/uL   nRBC 0.0 0.0 - 0.2 %    Comment: Performed at [facility], [place] [address] [place], [place] [address]  Glucose, capillary  Status: Abnormal   Collection Time: 05/29/18  7:43 AM  Result Value Ref Range   Glucose-Capillary 266 (H) 70 - 99 mg/dL    ECG   N/A  Telemetry   Sinus rhythm - Personally Reviewed  Radiology    No results found.  Cardiac Studies   N/A  Assessment   Principal Problem:   Unstable angina (HCC) Active Problems:   Hyponatremia   Leukocytosis   IDDM (insulin dependent diabetes mellitus) (HCC)   GERD (gastroesophageal reflux disease)   Plan   1. Mr. Bostic has has partially nitrate responsive chest pain as well as left facial numbness/tingling. This has mostly resolved today, however, his troponins are all negative. EKG is non-ischemic. BNP low. He is hyponatremic today - ?dehydration or related to hyperglycemia. Start hydration with NS at 50 cc/hr. Ok to eat today-  Keep NPO p MN for definitive cath tomorrow. No s/s of infection - afebrile, monitor leukocytosis.  Time Spent Directly with Patient:  I have spent a total of 25 minutes with the patient reviewing hospital notes, telemetry, EKGs, labs and examining the patient as well as establishing an assessment and plan that was discussed personally with the patient.  > 50% of time was spent in direct patient care.  Length of Stay:  LOS: 1 day   Pixie Casino, MD, [facility], [place] Director of the Advanced Lipid Disorders &  Cardiovascular Risk Reduction Clinic Diplomate of the American Board of Clinical Lipidology Attending Cardiologist  Direct Dial: [phone]  Fax: [phone]  Website:  [email or web address]  05/29/2018, 9:03 AM

## 2018-05-29 NOTE — Progress Notes (Addendum)
ANTICOAGULATION CONSULT NOTE - Follow-Up Consult  Pharmacy Consult for Heparin Indication: chest pain/ACS  No Known Allergies  Patient Measurements: Height: 5\' 9"  (175.3 cm) Weight: 185 lb 8 oz (84.1 kg) IBW/kg (Calculated) : 70.7 Heparin Dosing Weight: 84.1 kg  Vital Signs: Temp: 98 F (36.7 C) (01/19 0909) Temp Source: Oral (01/19 0909) BP: 110/67 (01/19 0909) Pulse Rate: 85 (01/19 0909)  Labs: Recent Labs    05/28/18 1737 05/28/18 2334 05/29/18 0351  HGB 15.5  --  14.9  HCT 44.1  --  42.6  PLT 297  --  290  LABPROT 13.2  --   --   INR 1.01  --   --   HEPARINUNFRC  --   --  0.32  CREATININE 1.09  --  1.15  TROPONINI <0.03 <0.03 <0.03   Estimated Creatinine Clearance: 68.3 mL/min (by C-G formula based on SCr of 1.15 mg/dL).  Assessment: 52 yoM with hx of remote CAD presents with CP. Troponins negative, cardiology planning for cath tomorrow. Initial heparin level therapeutic, H/H stable. Repeat level subtherapeutic likely due to bolus effect wearing off. Will increase infusion rate and recheck level in the morning. No issues with infusion per RN.   Goal of Therapy:  Heparin level 0.3-0.7 units/ml Monitor platelets by anticoagulation protocol: Yes   Plan:  -Increase heparin to 1200 units/hr -Recheck heparin level and CBC in am  Arrie Senate, PharmD, BCPS Clinical Pharmacist [phone] Please check AMION for all [place] numbers 05/29/2018

## 2018-05-30 ENCOUNTER — Encounter (HOSPITAL_COMMUNITY)
Admission: AD | Disposition: A | Discharge status: Home / Self Care | Payer: Self-pay | Source: Other Acute Inpatient Hospital | Attending: Cardiology

## 2018-05-30 ENCOUNTER — Inpatient Hospital Stay (HOSPITAL_COMMUNITY): Payer: Medicaid Other

## 2018-05-30 DIAGNOSIS — I2510 Atherosclerotic heart disease of native coronary artery without angina pectoris: Secondary | ICD-10-CM

## 2018-05-30 DIAGNOSIS — I200 Unstable angina: Secondary | ICD-10-CM

## 2018-05-30 HISTORY — PX: LEFT HEART CATH AND CORONARY ANGIOGRAPHY: CATH118249

## 2018-05-30 LAB — ECHOCARDIOGRAM COMPLETE
Height: 69 in
Weight: 2990.4 oz

## 2018-05-30 LAB — BASIC METABOLIC PANEL
Anion gap: 9 (ref 5–15)
BUN: 12 mg/dL (ref 6–20)
CO2: 20 mmol/L — ABNORMAL LOW (ref 22–32)
CREATININE: 0.92 mg/dL (ref 0.61–1.24)
Calcium: 8.6 mg/dL — ABNORMAL LOW (ref 8.9–10.3)
Chloride: 104 mmol/L (ref 98–111)
GFR calc Af Amer: >60 mL/min (ref >60)
GFR calc non Af Amer: >60 mL/min (ref >60)
Glucose, Bld: 236 mg/dL — ABNORMAL HIGH (ref 70–99)
Potassium: 4.6 mmol/L (ref 3.5–5.1)
Sodium: 133 mmol/L — ABNORMAL LOW (ref 135–145)

## 2018-05-30 LAB — GLUCOSE, CAPILLARY
Glucose-Capillary: 222 mg/dL — ABNORMAL HIGH (ref 70–99)
Glucose-Capillary: 113 mg/dL — ABNORMAL HIGH (ref 70–99)
Glucose-Capillary: 81 mg/dL (ref 70–99)

## 2018-05-30 LAB — CBC
HCT: 39.5 % (ref 39.0–52.0)
Hemoglobin: 13.9 g/dL (ref 13.0–17.0)
MCH: 32.4 pg (ref 26.0–34.0)
MCHC: 35.2 g/dL (ref 30.0–36.0)
MCV: 92.1 fL (ref 80.0–100.0)
Platelets: 262 10*3/uL (ref 150–400)
RBC: 4.29 MIL/uL (ref 4.22–5.81)
RDW: 12.5 % (ref 11.5–15.5)
WBC: 11.3 10*3/uL — ABNORMAL HIGH (ref 4.0–10.5)
nRBC: 0 % (ref 0.0–0.2)

## 2018-05-30 LAB — HEPARIN LEVEL (UNFRACTIONATED): Heparin Unfractionated: 0.27 IU/mL — ABNORMAL LOW (ref 0.30–0.70)

## 2018-05-30 SURGERY — LEFT HEART CATH AND CORONARY ANGIOGRAPHY
Anesthesia: LOCAL

## 2018-05-30 MED ORDER — LIDOCAINE HCL (PF) 1 % IJ SOLN
INTRAMUSCULAR | Status: AC
  Filled 2018-05-30: qty 30

## 2018-05-30 MED ORDER — IOHEXOL 350 MG/ML SOLN
INTRAVENOUS | Status: DC | PRN
  Administered 2018-05-30: 55 mL via INTRACARDIAC

## 2018-05-30 MED ORDER — MIDAZOLAM HCL 2 MG/2ML IJ SOLN
INTRAMUSCULAR | Status: AC
  Filled 2018-05-30: qty 2

## 2018-05-30 MED ORDER — VERAPAMIL HCL 2.5 MG/ML IV SOLN
INTRAVENOUS | Status: DC | PRN
  Administered 2018-05-30: 10 mL via INTRA_ARTERIAL

## 2018-05-30 MED ORDER — HEPARIN SODIUM (PORCINE) 1000 UNIT/ML IJ SOLN
INTRAMUSCULAR | Status: AC
  Filled 2018-05-30: qty 1

## 2018-05-30 MED ORDER — ASPIRIN 81 MG PO TBEC: 81.0000 mg | DELAYED_RELEASE_TABLET | Freq: Every day | ORAL | Status: DC

## 2018-05-30 MED ORDER — NICOTINE 14 MG/24HR TD PT24: 14.0000 mg | MEDICATED_PATCH | Freq: Every day | TRANSDERMAL | 0 refills | Status: DC

## 2018-05-30 MED ORDER — MIDAZOLAM HCL 2 MG/2ML IJ SOLN
INTRAMUSCULAR | Status: DC | PRN
  Administered 2018-05-30: 2 mg via INTRAVENOUS

## 2018-05-30 MED ORDER — METOPROLOL TARTRATE 25 MG PO TABS: 25.0000 mg | ORAL_TABLET | Freq: Two times a day (BID) | ORAL | 11 refills | Status: DC

## 2018-05-30 MED ORDER — HEPARIN (PORCINE) IN NACL 1000-0.9 UT/500ML-% IV SOLN
INTRAVENOUS | Status: DC | PRN
  Administered 2018-05-30 (×2): 500 mL

## 2018-05-30 MED ORDER — NITROGLYCERIN 0.4 MG SL SUBL: 0.4000 mg | SUBLINGUAL_TABLET | SUBLINGUAL | 12 refills | Status: DC | PRN

## 2018-05-30 MED ORDER — ATORVASTATIN CALCIUM 80 MG PO TABS: 80.0000 mg | ORAL_TABLET | Freq: Every day | ORAL | 11 refills | Status: DC

## 2018-05-30 MED ORDER — SODIUM CHLORIDE 0.9 % IV SOLN: 250.0000 mL | INTRAVENOUS | Status: DC | PRN

## 2018-05-30 MED ORDER — HEPARIN SODIUM (PORCINE) 1000 UNIT/ML IJ SOLN
INTRAMUSCULAR | Status: DC | PRN
  Administered 2018-05-30: 5000 [IU] via INTRAVENOUS

## 2018-05-30 MED ORDER — SODIUM CHLORIDE 0.9 % WEIGHT BASED INFUSION: 1.0000 mL/kg/h | INTRAVENOUS | Status: DC

## 2018-05-30 MED ORDER — FENTANYL CITRATE (PF) 100 MCG/2ML IJ SOLN
INTRAMUSCULAR | Status: AC
  Filled 2018-05-30: qty 2

## 2018-05-30 MED ORDER — FENTANYL CITRATE (PF) 100 MCG/2ML IJ SOLN
INTRAMUSCULAR | Status: DC | PRN
  Administered 2018-05-30 (×2): 25 ug via INTRAVENOUS

## 2018-05-30 MED ORDER — SODIUM CHLORIDE 0.9% FLUSH: 3.0000 mL | INTRAVENOUS | Status: DC | PRN

## 2018-05-30 MED ORDER — LIDOCAINE HCL (PF) 1 % IJ SOLN
INTRAMUSCULAR | Status: DC | PRN
  Administered 2018-05-30: 2 mL

## 2018-05-30 MED ORDER — SODIUM CHLORIDE 0.9% FLUSH: 3.0000 mL | Freq: Two times a day (BID) | INTRAVENOUS | Status: DC

## 2018-05-30 MED ORDER — VERAPAMIL HCL 2.5 MG/ML IV SOLN
INTRAVENOUS | Status: AC
  Filled 2018-05-30: qty 2

## 2018-05-30 MED ORDER — HEPARIN (PORCINE) IN NACL 1000-0.9 UT/500ML-% IV SOLN
INTRAVENOUS | Status: AC
  Filled 2018-05-30: qty 1000

## 2018-05-30 SURGICAL SUPPLY — 11 items
CATH 5FR JL3.5 JR4 ANG PIG MP (CATHETERS) ×1 IMPLANT
CATH INFINITI 5 FR JR5 (CATHETERS) IMPLANT
CATH INFINITI 5FR AL1 (CATHETERS) ×1 IMPLANT
DEVICE RAD COMP TR BAND LRG (VASCULAR PRODUCTS) ×1 IMPLANT
GLIDESHEATH SLEND SS 6F .021 (SHEATH) ×1 IMPLANT
GUIDEWIRE INQWIRE 1.5J.035X260 (WIRE) IMPLANT
INQWIRE 1.5J .035X260CM (WIRE) ×2
KIT HEART LEFT (KITS) ×2 IMPLANT
PACK CARDIAC CATHETERIZATION (CUSTOM PROCEDURE TRAY) ×2 IMPLANT
TRANSDUCER W/STOPCOCK (MISCELLANEOUS) ×2 IMPLANT
TUBING CIL FLEX 10 FLL-RA (TUBING) ×2 IMPLANT

## 2018-05-30 NOTE — Care Management Note (Signed)
Case Management Note  Patient Details  Name: Gabriel Gomez MRN: [id number] Date of Birth: January 22, 1961  Subjective/Objective:       Pt admitted with unstable angina. He is from [place]. He is in the process of moving to [place]. PCP: [facility] in [place]: pt has medicaid. Card copied and faxed to financial counseling.         Action/Plan: Pt states he wants to go to his daughters PCP in  and she is going to call and get him an appt.  CM following for d/c needs, physician orders.   Expected Discharge Date:                  Expected Discharge Plan:     In-House Referral:     Discharge planning Services     Post Acute Care Choice:    Choice offered to:     DME Arranged:    DME Agency:     HH Arranged:    HH Agency:     Status of Service:  In process, will continue to follow  If discussed at Long Length of Stay Meetings, dates discussed:    Additional Comments:  Pollie Friar, RN 05/30/2018, 4:24 PM

## 2018-05-30 NOTE — Progress Notes (Signed)
  Echocardiogram 2D Echocardiogram has been performed.  Gabriel Gomez 05/30/2018, 10:04 AM

## 2018-05-30 NOTE — Progress Notes (Addendum)
DAILY PROGRESS NOTE   Patient Name: Deloy Archey Date of Encounter: 05/30/2018  Cardiology :  New   Chief Complaint   Chest pain  Patient Profile   Timoteo Carreiro is a 58 y.o. male with a history of CAD, DM, GERD who presents with chest pain concerning for UA.  Subjective   Had 2/10 CP overnight, relieved w/ morphine, pain-free now, no questions about cath  Objective   Vitals:   05/30/18 0115 05/30/18 0456 05/30/18 0500 05/30/18 0847  BP: 122/60 125/67  135/75  Pulse: 68   69  Resp:  15    Temp: 98 F (36.7 C) 98 F (36.7 C)    TempSrc:      SpO2:  98%    Weight:   84.8 kg   Height:        Intake/Output Summary (Last 24 hours) at 05/30/2018 3662 Last data filed at 05/30/2018 9476 Gross per 24 hour  Intake 1364.42 ml  Output 1450 ml  Net -85.58 ml   Filed Weights   05/28/18 1702 05/29/18 0556 05/30/18 0500  Weight: 84.1 kg 84.1 kg 84.8 kg    Physical Exam   General appearance: alert and no distress Neck: no carotid bruit, no JVD and thyroid not enlarged, symmetric, no tenderness/mass/nodules Lungs: rhonchi a few, scattered Heart: regular rate and rhythm Abdomen: soft, non-tender; bowel sounds normal; no masses,  no organomegaly Extremities: extremities normal, atraumatic, no cyanosis or edema Pulses: 2+ and symmetric Skin: Skin color, texture, turgor normal. No rashes or lesions Neurologic: Grossly normal Psych: Pleasant  Inpatient Medications    Scheduled Meds: . aspirin EC  81 mg Oral Daily  . atorvastatin  80 mg Oral q1800  . insulin aspart  0-15 Units Subcutaneous TID WC  . insulin aspart  0-5 Units Subcutaneous QHS  . insulin aspart  4 Units Subcutaneous TID WC  . insulin glargine  18 Units Subcutaneous QHS  . metoprolol tartrate  25 mg Oral BID  . nicotine  14 mg Transdermal Daily  . pantoprazole  40 mg Oral Daily  . sodium chloride flush  3 mL Intravenous Q12H    Continuous Infusions: . sodium chloride    . sodium chloride 50  mL/hr at 05/29/18 1816  . heparin 1,300 Units/hr (05/30/18 0850)  . nitroGLYCERIN 35 mcg/min (05/29/18 1658)    PRN Meds: sodium chloride, acetaminophen, morphine injection, nitroGLYCERIN, ondansetron (ZOFRAN) IV, sodium chloride flush   Labs   Results for orders placed or performed during the hospital encounter of 05/28/18 (from the past 48 hour(s))  Glucose, capillary     Status: Abnormal   Collection Time: 05/28/18  4:59 PM  Result Value Ref Range   Glucose-Capillary 134 (H) 70 - 99 mg/dL  Comprehensive metabolic panel     Status: Abnormal   Collection Time: 05/28/18  5:37 PM  Result Value Ref Range   Sodium 132 (L) 135 - 145 mmol/L   Potassium 4.1 3.5 - 5.1 mmol/L   Chloride 101 98 - 111 mmol/L   CO2 20 (L) 22 - 32 mmol/L   Glucose, Bld 145 (H) 70 - 99 mg/dL   BUN 9 6 - 20 mg/dL   Creatinine, Ser 1.09 0.61 - 1.24 mg/dL   Calcium 8.8 (L) 8.9 - 10.3 mg/dL   Total Protein 6.6 6.5 - 8.1 g/dL   Albumin 3.4 (L) 3.5 - 5.0 g/dL   AST 15 15 - 41 U/L   ALT 13 0 - 44 U/L   Alkaline Phosphatase  78 38 - 126 U/L   Total Bilirubin 1.1 0.3 - 1.2 mg/dL   GFR calc non Af Amer >60 >60 mL/min   GFR calc Af Amer >60 >60 mL/min   Anion gap 11 5 - 15    Comment: Performed at [facility] [address], [place] [address]  CBC     Status: None   Collection Time: 05/28/18  5:37 PM  Result Value Ref Range   WBC 10.2 4.0 - 10.5 K/uL   RBC 4.79 4.22 - 5.81 MIL/uL   Hemoglobin 15.5 13.0 - 17.0 g/dL   HCT 44.1 39.0 - 52.0 %   MCV 92.1 80.0 - 100.0 fL   MCH 32.4 26.0 - 34.0 pg   MCHC 35.1 30.0 - 36.0 g/dL   RDW 12.8 11.5 - 15.5 %   Platelets 297 150 - 400 K/uL   nRBC 0.0 0.0 - 0.2 %    Comment: Performed at [facility], [place] [address], [place], [place] [address]  Troponin I - Now Then Q6H     Status: None   Collection Time: 05/28/18  5:37 PM  Result Value Ref Range   Troponin I <0.03 <0.03 ng/mL    Comment: Performed at [facility] [address],  [place], [place] [address]  Protime-INR     Status: None   Collection Time: 05/28/18  5:37 PM  Result Value Ref Range   Prothrombin Time 13.2 11.4 - 15.2 seconds   INR 1.01     Comment: Performed at [facility], [place] [address], [place], [place] [address]  Magnesium     Status: None   Collection Time: 05/28/18  5:37 PM  Result Value Ref Range   Magnesium 1.9 1.7 - 2.4 mg/dL    Comment: Performed at [facility], [place] [address], [place], [place] [address]  Brain natriuretic peptide     Status: None   Collection Time: 05/28/18  5:37 PM  Result Value Ref Range   B Natriuretic Peptide 25.0 0.0 - 100.0 pg/mL    Comment: Performed at [facility] [address], [place], [place] [address]  Hemoglobin A1c     Status: Abnormal   Collection Time: 05/28/18  5:37 PM  Result Value Ref Range   Hgb A1c MFr Bld 8.1 (H) 4.8 - 5.6 %    Comment: (NOTE) Pre diabetes:          5.7%-6.4% Diabetes:              >6.4% Glycemic control for   <7.0% adults with diabetes    Mean Plasma Glucose 185.77 mg/dL    Comment: Performed at [facility] [address], [place], [place] [address]  Glucose, capillary     Status: Abnormal   Collection Time: 05/28/18  8:58 PM  Result Value Ref Range   Glucose-Capillary 263 (H) 70 - 99 mg/dL  Troponin I - Now Then Q6H     Status: None   Collection Time: 05/28/18 11:34 PM  Result Value Ref Range   Troponin I <0.03 <0.03 ng/mL    Comment: Performed at [facility] [address], [place],  [address]  HIV antibody (Routine Testing)     Status: None   Collection Time: 05/28/18 11:34 PM  Result Value Ref Range   HIV Screen 4th Generation wRfx Non Reactive Non Reactive    Comment: (NOTE) Performed At: [facility] [address]  [place], [place] [id number] Rush Farmer MD [id number]   Troponin I - Now Then Q6H     Status: None   Collection Time: 05/29/18  3:51 AM  Result Value Ref Range   Troponin I <0.03 <0.03 ng/mL      Comment: Performed at [facility], [place] [address], [place], [place] [address]  Basic metabolic panel     Status: Abnormal   Collection Time: 05/29/18  3:51 AM  Result Value Ref Range   Sodium 128 (L) 135 - 145 mmol/L   Potassium 4.6 3.5 - 5.1 mmol/L   Chloride 96 (L) 98 - 111 mmol/L   CO2 20 (L) 22 - 32 mmol/L   Glucose, Bld 288 (H) 70 - 99 mg/dL   BUN 14 6 - 20 mg/dL   Creatinine, Ser 1.15 0.61 - 1.24 mg/dL   Calcium 8.6 (L) 8.9 - 10.3 mg/dL   GFR calc non Af Amer >60 >60 mL/min   GFR calc Af Amer >60 >60 mL/min   Anion gap 12 5 - 15    Comment: Performed at [facility] [address] [place], [place] [address]  Lipid panel     Status: Abnormal   Collection Time: 05/29/18  3:51 AM  Result Value Ref Range   Cholesterol 199 0 - 200 mg/dL   Triglycerides 144 <150 mg/dL   HDL 41 >40 mg/dL   Total CHOL/HDL Ratio 4.9 RATIO   VLDL 29 0 - 40 mg/dL   LDL Cholesterol 129 (H) 0 - 99 mg/dL    Comment:        Total Cholesterol/HDL:CHD Risk Coronary Heart Disease Risk Table                     Men   Women  1/2 Average Risk   3.4   3.3  Average Risk       5.0   4.4  2 X Average Risk   9.6   7.1  3 X Average Risk  23.4   11.0        Use the calculated Patient Ratio above and the CHD Risk Table to determine the patient's CHD Risk.        ATP III CLASSIFICATION (LDL):  <100     mg/dL   Optimal  100-129  mg/dL   Near or Above                    Optimal  130-159  mg/dL   Borderline  160-189  mg/dL   High  >190     mg/dL   Very High Performed at [facility] [address] [place], [place] [address]   Heparin level (unfractionated)     Status: None   Collection Time: 05/29/18  3:51 AM  Result Value Ref Range   Heparin Unfractionated 0.32 0.30 - 0.70 IU/mL    Comment: (NOTE) If heparin results are below expected values, and patient dosage has  been confirmed, suggest follow up testing of antithrombin III levels. Performed at [facility], [place] [address] [place] [address]   CBC     Status: Abnormal   Collection Time: 05/29/18  3:51 AM  Result Value Ref Range   WBC 12.7 (H) 4.0 - 10.5 K/uL   RBC 4.58 4.22 - 5.81 MIL/uL   Hemoglobin 14.9 13.0 - 17.0 g/dL   HCT 42.6 39.0 - 52.0 %   MCV 93.0 80.0 -  100.0 fL   MCH 32.5 26.0 - 34.0 pg   MCHC 35.0 30.0 - 36.0 g/dL   RDW 13.0 11.5 - 15.5 %   Platelets 290 150 - 400 K/uL   nRBC 0.0 0.0 - 0.2 %    Comment: Performed at [facility], [place] [address] [place], [place] [address]  Glucose, capillary     Status: Abnormal   Collection Time: 05/29/18  7:43 AM  Result Value Ref Range   Glucose-Capillary 266 (H) 70 - 99 mg/dL  Troponin I - Now Then Q6H     Status: None   Collection Time: 05/29/18 10:22 AM  Result Value Ref Range   Troponin I <0.03 <0.03 ng/mL    Comment: Performed at [facility] [address], [place], [place] [address]  Heparin level (unfractionated)     Status: Abnormal   Collection Time: 05/29/18 10:22 AM  Result Value Ref Range   Heparin Unfractionated 0.18 (L) 0.30 - 0.70 IU/mL    Comment: (NOTE) If heparin results are below expected values, and patient dosage has  been confirmed, suggest follow up testing of antithrombin III levels. Performed at [facility], [place] [address], [place], [place] [address]   Glucose, capillary     Status: Abnormal   Collection Time: 05/29/18 11:10 AM  Result Value Ref Range   Glucose-Capillary 319 (H) 70 - 99 mg/dL  Glucose, capillary     Status: Abnormal   Collection Time: 05/29/18  4:30 PM  Result Value Ref Range   Glucose-Capillary 144 (H) 70 - 99 mg/dL  Glucose, capillary     Status: Abnormal   Collection Time: 05/29/18  8:42 PM  Result Value Ref Range   Glucose-Capillary 198 (H) 70 - 99 mg/dL  Heparin level (unfractionated)     Status: Abnormal   Collection Time: 05/30/18  4:08 AM  Result Value Ref Range   Heparin Unfractionated 0.27 (L) 0.30 - 0.70 IU/mL    Comment: (NOTE) If heparin results  are below expected values, and patient dosage has  been confirmed, suggest follow up testing of antithrombin III levels. Performed at [facility], [place] [address] [place], [place] [address]   CBC     Status: Abnormal   Collection Time: 05/30/18  4:08 AM  Result Value Ref Range   WBC 11.3 (H) 4.0 - 10.5 K/uL   RBC 4.29 4.22 - 5.81 MIL/uL   Hemoglobin 13.9 13.0 - 17.0 g/dL   HCT 39.5 39.0 - 52.0 %   MCV 92.1 80.0 - 100.0 fL   MCH 32.4 26.0 - 34.0 pg   MCHC 35.2 30.0 - 36.0 g/dL   RDW 12.5 11.5 - 15.5 %   Platelets 262 150 - 400 K/uL   nRBC 0.0 0.0 - 0.2 %    Comment: Performed at [facility], [place] [address], [place], [place] [address]  Basic metabolic panel     Status: Abnormal   Collection Time: 05/30/18  4:08 AM  Result Value Ref Range   Sodium 133 (L) 135 - 145 mmol/L   Potassium 4.6 3.5 - 5.1 mmol/L   Chloride 104 98 - 111 mmol/L   CO2 20 (L) 22 - 32 mmol/L   Glucose, Bld 236 (H) 70 - 99 mg/dL   BUN 12 6 - 20 mg/dL   Creatinine, Ser 0.92 0.61 - 1.24 mg/dL   Calcium 8.6 (L) 8.9 - 10.3 mg/dL   GFR calc non Af Amer >60 >60  mL/min   GFR calc Af Amer >60 >60 mL/min   Anion gap 9 5 - 15    Comment: Performed at [facility] [address], [place], [place] [address]  Glucose, capillary     Status: Abnormal   Collection Time: 05/30/18  7:23 AM  Result Value Ref Range   Glucose-Capillary 222 (H) 70 - 99 mg/dL    ECG   N/A  Telemetry   SR, PVCs - Personally Reviewed  Radiology    No results found.  Cardiac Studies   N/A  Assessment   Principal Problem:   Unstable angina (HCC) Active Problems:   Hyponatremia   Leukocytosis   IDDM (insulin dependent diabetes mellitus) (HCC)   GERD (gastroesophageal reflux disease)   Plan   1. Chest pain: partial relief w/ nitrates, complete relief w/ morphine. ECG w/out acute changes, troponin negative. Definitive eval w/ cath, on board and orders written 2. Hyponatremia: Na down to 128>>133 w/ IV NS,  peak BUN/Cr 14/1.15, improved w/ hydration.  3. Facial numbness/tingling: resolved, CT at [facility] w/out sig abnormality, f/u with PCP   Length of Stay:  LOS: 2 days   Rosaria Ferries 05/30/2018, 9:24 AM   History and all data above reviewed.  Patient examined. Some discomfort earlier this morning.   I agree with the findings as above.  The patient exam reveals COR:RRR  ,  Lungs: Clear  ,  Abd: Positive bowel sounds, no rebound no guarding, Ext No edema  .  All available labs, radiology testing, previous records reviewed. Agree with documented assessment and plan. Unstable angina.  Cath today.   DM:  A1C not at target.  Consider SGLT2i or GLP1ra but will defer to PCP.  Will ask diabetes coordinator to see to recommend any change to meds.  Should be on ACE inhibitor as well with CAD/DM.  Will add at discharge.    Jeneen Rinks [facility]  10:43 AM  05/30/2018

## 2018-05-30 NOTE — Interval H&P Note (Signed)
Cath Lab Visit (complete for each Cath Lab visit)  Clinical Evaluation Leading to the Procedure:   ACS: Yes.    Non-ACS:    Anginal Classification: CCS IV  Anti-ischemic medical therapy: Minimal Therapy (1 class of medications)  Non-Invasive Test Results: No non-invasive testing performed  Prior CABG: No previous CABG      History and Physical Interval Note:  05/30/2018 3:36 PM  Gabriel Gomez  has presented today for surgery, with the diagnosis of unstable angina  The various methods of treatment have been discussed with the patient and family. After consideration of risks, benefits and other options for treatment, the patient has consented to  Procedure(s): LEFT HEART CATH AND CORONARY ANGIOGRAPHY (N/A) as a surgical intervention .  The patient's history has been reviewed, patient examined, no change in status, stable for surgery.  I have reviewed the patient's chart and labs.  Questions were answered to the patient's satisfaction.     Sherren Mocha

## 2018-05-30 NOTE — Progress Notes (Addendum)
Inpatient Diabetes Program Recommendations  AACE/ADA: New Consensus Statement on Inpatient Glycemic Control (2015)  Target Ranges:  Prepandial:   less than 140 mg/dL      Peak postprandial:   less than 180 mg/dL (1-2 hours)      Critically ill patients:  140 - 180 mg/dL   Lab Results  Component Value Date   GLUCAP 113 (H) 05/30/2018   HGBA1C 8.1 (H) 05/28/2018    Review of Glycemic Control Results for RUHAAN, NORDAHL (MRN [id number]) as of 05/30/2018 13:27  Ref. Range 05/29/2018 16:30 05/29/2018 20:42 05/30/2018 07:23 05/30/2018 11:37  Glucose-Capillary Latest Ref Range: 70 - 99 mg/dL 144 (H) 198 (H) 222 (H) 113 (H)   Diabetes history: Type 2 DM Outpatient Diabetes medications: Lantus 25 units QHs, Novolog TID Current orders for Inpatient glycemic control: Lantus 18 units QHS, Novolog 4 units TID, Novolog 0-15 units TID, Novolog 0-5 units QHS  Inpatient Diabetes Program Recommendations:    Consider increasing Lantus to 24 units QHS.  Attempted to see patient, will plan to see on 1/21.  Thanks, Bronson Curb, MSN, RNC-OB Diabetes Coordinator [phone] (8a-5p)

## 2018-05-30 NOTE — Discharge Instructions (Signed)

## 2018-05-30 NOTE — H&P (View-Only) (Signed)
DAILY PROGRESS NOTE   Patient Name: Gabriel Gomez Date of Encounter: 05/30/2018  Cardiology :  New   Chief Complaint   Chest pain  Patient Profile   Jaeger Trueheart is a 58 y.o. male with a history of CAD, DM, GERD who presents with chest pain concerning for UA.  Subjective   Had 2/10 CP overnight, relieved w/ morphine, pain-free now, no questions about cath  Objective   Vitals:   05/30/18 0115 05/30/18 0456 05/30/18 0500 05/30/18 0847  BP: 122/60 125/67  135/75  Pulse: 68   69  Resp:  15    Temp: 98 F (36.7 C) 98 F (36.7 C)    TempSrc:      SpO2:  98%    Weight:   84.8 kg   Height:        Intake/Output Summary (Last 24 hours) at 05/30/2018 2952 Last data filed at 05/30/2018 8413 Gross per 24 hour  Intake 1364.42 ml  Output 1450 ml  Net -85.58 ml   Filed Weights   05/28/18 1702 05/29/18 0556 05/30/18 0500  Weight: 84.1 kg 84.1 kg 84.8 kg    Physical Exam   General appearance: alert and no distress Neck: no carotid bruit, no JVD and thyroid not enlarged, symmetric, no tenderness/mass/nodules Lungs: rhonchi a few, scattered Heart: regular rate and rhythm Abdomen: soft, non-tender; bowel sounds normal; no masses,  no organomegaly Extremities: extremities normal, atraumatic, no cyanosis or edema Pulses: 2+ and symmetric Skin: Skin color, texture, turgor normal. No rashes or lesions Neurologic: Grossly normal Psych: Pleasant  Inpatient Medications    Scheduled Meds: . aspirin EC  81 mg Oral Daily  . atorvastatin  80 mg Oral q1800  . insulin aspart  0-15 Units Subcutaneous TID WC  . insulin aspart  0-5 Units Subcutaneous QHS  . insulin aspart  4 Units Subcutaneous TID WC  . insulin glargine  18 Units Subcutaneous QHS  . metoprolol tartrate  25 mg Oral BID  . nicotine  14 mg Transdermal Daily  . pantoprazole  40 mg Oral Daily  . sodium chloride flush  3 mL Intravenous Q12H    Continuous Infusions: . sodium chloride    . sodium chloride 50  mL/hr at 05/29/18 1816  . heparin 1,300 Units/hr (05/30/18 0850)  . nitroGLYCERIN 35 mcg/min (05/29/18 1658)    PRN Meds: sodium chloride, acetaminophen, morphine injection, nitroGLYCERIN, ondansetron (ZOFRAN) IV, sodium chloride flush   Labs   Results for orders placed or performed during the hospital encounter of 05/28/18 (from the past 48 hour(s))  Glucose, capillary     Status: Abnormal   Collection Time: 05/28/18  4:59 PM  Result Value Ref Range   Glucose-Capillary 134 (H) 70 - 99 mg/dL  Comprehensive metabolic panel     Status: Abnormal   Collection Time: 05/28/18  5:37 PM  Result Value Ref Range   Sodium 132 (L) 135 - 145 mmol/L   Potassium 4.1 3.5 - 5.1 mmol/L   Chloride 101 98 - 111 mmol/L   CO2 20 (L) 22 - 32 mmol/L   Glucose, Bld 145 (H) 70 - 99 mg/dL   BUN 9 6 - 20 mg/dL   Creatinine, Ser 1.09 0.61 - 1.24 mg/dL   Calcium 8.8 (L) 8.9 - 10.3 mg/dL   Total Protein 6.6 6.5 - 8.1 g/dL   Albumin 3.4 (L) 3.5 - 5.0 g/dL   AST 15 15 - 41 U/L   ALT 13 0 - 44 U/L   Alkaline Phosphatase  78 38 - 126 U/L   Total Bilirubin 1.1 0.3 - 1.2 mg/dL   GFR calc non Af Amer >60 >60 mL/min   GFR calc Af Amer >60 >60 mL/min   Anion gap 11 5 - 15    Comment: Performed at [facility] [address], [place] [address]  CBC     Status: None   Collection Time: 05/28/18  5:37 PM  Result Value Ref Range   WBC 10.2 4.0 - 10.5 K/uL   RBC 4.79 4.22 - 5.81 MIL/uL   Hemoglobin 15.5 13.0 - 17.0 g/dL   HCT 44.1 39.0 - 52.0 %   MCV 92.1 80.0 - 100.0 fL   MCH 32.4 26.0 - 34.0 pg   MCHC 35.1 30.0 - 36.0 g/dL   RDW 12.8 11.5 - 15.5 %   Platelets 297 150 - 400 K/uL   nRBC 0.0 0.0 - 0.2 %    Comment: Performed at [facility], [place] [address], [place], [place] [address]  Troponin I - Now Then Q6H     Status: None   Collection Time: 05/28/18  5:37 PM  Result Value Ref Range   Troponin I <0.03 <0.03 ng/mL    Comment: Performed at [facility] [address],  [place], [place] [address]  Protime-INR     Status: None   Collection Time: 05/28/18  5:37 PM  Result Value Ref Range   Prothrombin Time 13.2 11.4 - 15.2 seconds   INR 1.01     Comment: Performed at [facility], [place] [address] [place], [place] [address]  Magnesium     Status: None   Collection Time: 05/28/18  5:37 PM  Result Value Ref Range   Magnesium 1.9 1.7 - 2.4 mg/dL    Comment: Performed at [facility], [place] [address], [place], [place] [address]  Brain natriuretic peptide     Status: None   Collection Time: 05/28/18  5:37 PM  Result Value Ref Range   B Natriuretic Peptide 25.0 0.0 - 100.0 pg/mL    Comment: Performed at [facility] [address], [place], [place] [address]  Hemoglobin A1c     Status: Abnormal   Collection Time: 05/28/18  5:37 PM  Result Value Ref Range   Hgb A1c MFr Bld 8.1 (H) 4.8 - 5.6 %    Comment: (NOTE) Pre diabetes:          5.7%-6.4% Diabetes:              >6.4% Glycemic control for   <7.0% adults with diabetes    Mean Plasma Glucose 185.77 mg/dL    Comment: Performed at [facility] [address] [place], [place] [address]  Glucose, capillary     Status: Abnormal   Collection Time: 05/28/18  8:58 PM  Result Value Ref Range   Glucose-Capillary 263 (H) 70 - 99 mg/dL  Troponin I - Now Then Q6H     Status: None   Collection Time: 05/28/18 11:34 PM  Result Value Ref Range   Troponin I <0.03 <0.03 ng/mL    Comment: Performed at [facility] [address], [place], [place] [address]  HIV antibody (Routine Testing)     Status: None   Collection Time: 05/28/18 11:34 PM  Result Value Ref Range   HIV Screen 4th Generation wRfx Non Reactive Non Reactive    Comment: (NOTE) Performed At: [facility] [address]  [place], [place] [id number] Rush Farmer MD CV:[id number]   Troponin I - Now Then Q6H     Status: None   Collection Time: 05/29/18  3:51 AM  Result Value Ref Range   Troponin I <0.03 <0.03 ng/mL      Comment: Performed at [facility], [place] [address] [place], [place] [address]  Basic metabolic panel     Status: Abnormal   Collection Time: 05/29/18  3:51 AM  Result Value Ref Range   Sodium 128 (L) 135 - 145 mmol/L   Potassium 4.6 3.5 - 5.1 mmol/L   Chloride 96 (L) 98 - 111 mmol/L   CO2 20 (L) 22 - 32 mmol/L   Glucose, Bld 288 (H) 70 - 99 mg/dL   BUN 14 6 - 20 mg/dL   Creatinine, Ser 1.15 0.61 - 1.24 mg/dL   Calcium 8.6 (L) 8.9 - 10.3 mg/dL   GFR calc non Af Amer >60 >60 mL/min   GFR calc Af Amer >60 >60 mL/min   Anion gap 12 5 - 15    Comment: Performed at [facility] [address], [place],  [address]  Lipid panel     Status: Abnormal   Collection Time: 05/29/18  3:51 AM  Result Value Ref Range   Cholesterol 199 0 - 200 mg/dL   Triglycerides 144 <150 mg/dL   HDL 41 >40 mg/dL   Total CHOL/HDL Ratio 4.9 RATIO   VLDL 29 0 - 40 mg/dL   LDL Cholesterol 129 (H) 0 - 99 mg/dL    Comment:        Total Cholesterol/HDL:CHD Risk Coronary Heart Disease Risk Table                     Men   Women  1/2 Average Risk   3.4   3.3  Average Risk       5.0   4.4  2 X Average Risk   9.6   7.1  3 X Average Risk  23.4   11.0        Use the calculated Patient Ratio above and the CHD Risk Table to determine the patient's CHD Risk.        ATP III CLASSIFICATION (LDL):  <100     mg/dL   Optimal  100-129  mg/dL   Near or Above                    Optimal  130-159  mg/dL   Borderline  160-189  mg/dL   High  >190     mg/dL   Very High Performed at [facility] [address], [place], [place] [address]   Heparin level (unfractionated)     Status: None   Collection Time: 05/29/18  3:51 AM  Result Value Ref Range   Heparin Unfractionated 0.32 0.30 - 0.70 IU/mL    Comment: (NOTE) If heparin results are below expected values, and patient dosage has  been confirmed, suggest follow up testing of antithrombin III levels. Performed at [facility], [place] [address], [place] [address]   CBC     Status: Abnormal   Collection Time: 05/29/18  3:51 AM  Result Value Ref Range   WBC 12.7 (H) 4.0 - 10.5 K/uL   RBC 4.58 4.22 - 5.81 MIL/uL   Hemoglobin 14.9 13.0 - 17.0 g/dL   HCT 42.6 39.0 - 52.0 %   MCV 93.0 80.0 -  100.0 fL   MCH 32.5 26.0 - 34.0 pg   MCHC 35.0 30.0 - 36.0 g/dL   RDW 13.0 11.5 - 15.5 %   Platelets 290 150 - 400 K/uL   nRBC 0.0 0.0 - 0.2 %    Comment: Performed at [facility], [place] [address] [place], [place] [address]  Glucose, capillary     Status: Abnormal   Collection Time: 05/29/18  7:43 AM  Result Value Ref Range   Glucose-Capillary 266 (H) 70 - 99 mg/dL  Troponin I - Now Then Q6H     Status: None   Collection Time: 05/29/18 10:22 AM  Result Value Ref Range   Troponin I <0.03 <0.03 ng/mL    Comment: Performed at [facility] [address] [place], [place] [address]  Heparin level (unfractionated)     Status: Abnormal   Collection Time: 05/29/18 10:22 AM  Result Value Ref Range   Heparin Unfractionated 0.18 (L) 0.30 - 0.70 IU/mL    Comment: (NOTE) If heparin results are below expected values, and patient dosage has  been confirmed, suggest follow up testing of antithrombin III levels. Performed at [facility], [place] [address] [place], [place] [address]   Glucose, capillary     Status: Abnormal   Collection Time: 05/29/18 11:10 AM  Result Value Ref Range   Glucose-Capillary 319 (H) 70 - 99 mg/dL  Glucose, capillary     Status: Abnormal   Collection Time: 05/29/18  4:30 PM  Result Value Ref Range   Glucose-Capillary 144 (H) 70 - 99 mg/dL  Glucose, capillary     Status: Abnormal   Collection Time: 05/29/18  8:42 PM  Result Value Ref Range   Glucose-Capillary 198 (H) 70 - 99 mg/dL  Heparin level (unfractionated)     Status: Abnormal   Collection Time: 05/30/18  4:08 AM  Result Value Ref Range   Heparin Unfractionated 0.27 (L) 0.30 - 0.70 IU/mL    Comment: (NOTE) If heparin results  are below expected values, and patient dosage has  been confirmed, suggest follow up testing of antithrombin III levels. Performed at [facility], [place] [address], [place], [place] [address]   CBC     Status: Abnormal   Collection Time: 05/30/18  4:08 AM  Result Value Ref Range   WBC 11.3 (H) 4.0 - 10.5 K/uL   RBC 4.29 4.22 - 5.81 MIL/uL   Hemoglobin 13.9 13.0 - 17.0 g/dL   HCT 39.5 39.0 - 52.0 %   MCV 92.1 80.0 - 100.0 fL   MCH 32.4 26.0 - 34.0 pg   MCHC 35.2 30.0 - 36.0 g/dL   RDW 12.5 11.5 - 15.5 %   Platelets 262 150 - 400 K/uL   nRBC 0.0 0.0 - 0.2 %    Comment: Performed at [facility], [place] [address], [place], [place] [address]  Basic metabolic panel     Status: Abnormal   Collection Time: 05/30/18  4:08 AM  Result Value Ref Range   Sodium 133 (L) 135 - 145 mmol/L   Potassium 4.6 3.5 - 5.1 mmol/L   Chloride 104 98 - 111 mmol/L   CO2 20 (L) 22 - 32 mmol/L   Glucose, Bld 236 (H) 70 - 99 mg/dL   BUN 12 6 - 20 mg/dL   Creatinine, Ser 0.92 0.61 - 1.24 mg/dL   Calcium 8.6 (L) 8.9 - 10.3 mg/dL   GFR calc non Af Amer >60 >60  mL/min   GFR calc Af Amer >60 >60 mL/min   Anion gap 9 5 - 15    Comment: Performed at [facility] [address], [place], [place] [address]  Glucose, capillary     Status: Abnormal   Collection Time: 05/30/18  7:23 AM  Result Value Ref Range   Glucose-Capillary 222 (H) 70 - 99 mg/dL    ECG   N/A  Telemetry   SR, PVCs - Personally Reviewed  Radiology    No results found.  Cardiac Studies   N/A  Assessment   Principal Problem:   Unstable angina (HCC) Active Problems:   Hyponatremia   Leukocytosis   IDDM (insulin dependent diabetes mellitus) (HCC)   GERD (gastroesophageal reflux disease)   Plan   1. Chest pain: partial relief w/ nitrates, complete relief w/ morphine. ECG w/out acute changes, troponin negative. Definitive eval w/ cath, on board and orders written 2. Hyponatremia: Na down to 128>>133 w/ IV NS,  peak BUN/Cr 14/1.15, improved w/ hydration.  3. Facial numbness/tingling: resolved, CT at [facility] w/out sig abnormality, f/u with PCP   Length of Stay:  LOS: 2 days   Rosaria Ferries 05/30/2018, 9:24 AM   History and all data above reviewed.  Patient examined. Some discomfort earlier this morning.   I agree with the findings as above.  The patient exam reveals COR:RRR  ,  Lungs: Clear  ,  Abd: Positive bowel sounds, no rebound no guarding, Ext No edema  .  All available labs, radiology testing, previous records reviewed. Agree with documented assessment and plan. Unstable angina.  Cath today.   DM:  A1C not at target.  Consider SGLT2i or GLP1ra but will defer to PCP.  Will ask diabetes coordinator to see to recommend any change to meds.  Should be on ACE inhibitor as well with CAD/DM.  Will add at discharge.    Jeneen Rinks [facility]  10:43 AM  05/30/2018

## 2018-05-30 NOTE — Progress Notes (Signed)
ANTICOAGULATION CONSULT NOTE - Follow-Up Consult  Pharmacy Consult for Heparin Indication: chest pain/ACS  No Known Allergies  Patient Measurements: Height: 5\' 9"  (175.3 cm) Weight: 186 lb 14.4 oz (84.8 kg) IBW/kg (Calculated) : 70.7 Heparin Dosing Weight: 84.1 kg  Vital Signs: Temp: 98 F (36.7 C) (01/20 0456) BP: 125/67 (01/20 0456) Pulse Rate: 68 (01/20 0115)  Labs: Recent Labs    05/28/18 1737 05/28/18 2334 05/29/18 0351 05/29/18 1022 05/30/18 0408  HGB 15.5  --  14.9  --  13.9  HCT 44.1  --  42.6  --  39.5  PLT 297  --  290  --  262  LABPROT 13.2  --   --   --   --   INR 1.01  --   --   --   --   HEPARINUNFRC  --   --  0.32 0.18* 0.27*  CREATININE 1.09  --  1.15  --  0.92  TROPONINI <0.03 <0.03 <0.03 <0.03  --    Estimated Creatinine Clearance: 85.4 mL/min (by C-G formula based on SCr of 0.92 mg/dL).  Assessment: 1 yoM with hx of remote CAD presents with CP. Troponins negative, cardiology planning for cath today -heparin level = 0.27  Goal of Therapy:  Heparin level 0.3-0.7 units/ml Monitor platelets by anticoagulation protocol: Yes   Plan:  -Increase heparin to 1300 units/hr -Will follow plans post cath  Hildred Laser, PharmD Clinical Pharmacist **Pharmacist phone directory can now be found on [email or web address] ([email or web address]).  Listed under [place].

## 2018-05-30 NOTE — Discharge Summary (Signed)
Discharge Summary    Patient ID: Gabriel Gomez,  MRN: [id number], DOB/AGE: [birth year] 58 y.o.  Admit date: 05/28/2018 Discharge date: 05/30/2018  Primary Care Provider: Patient, No Pcp Per Primary Cardiologist: Pixie Casino, MD   Discharge Diagnoses    Principal Problem:   Unstable angina [facility]) Active Problems:   Hyponatremia   Leukocytosis   IDDM (insulin dependent diabetes mellitus) (HCC)   GERD (gastroesophageal reflux disease)   Allergies No Known Allergies  Diagnostic Studies/Procedures    CARDIAC CATH: 05/30/2018  Prox RCA lesion is 30% stenosed.  Mid RCA lesion is 30% stenosed.  Dist RCA lesion is 40% stenosed.  Dist LM to Ost LAD lesion is 40% stenosed.  Prox LAD lesion is 40% stenosed.  Ost 1st Diag lesion is 30% stenosed.  Prox Cx to Mid Cx lesion is 40% stenosed.  Post Atrio lesion is 100% stenosed.   1. Mild diffuse nonobstructive CAD as outlined above (no lesions greater than 50% in any of the major epicardial vessels) 2. Normal LV function by echo and normal LVEDP 3. There is a distal RCA branch vessel occlusion involving a tiny PLA vessel < 1 mm in caliber  Suspect noncardiac CP. Recommend medical therapy, tobacco cessation for risk reduction.   ECHO: 05/30/2018 - Left ventricle: The cavity size was normal. Wall thickness was   increased in a pattern of mild LVH. Systolic function was normal.   The estimated ejection fraction was in the range of 60% to 65%.   Wall motion was normal; there were no regional wall motion   abnormalities. Features are consistent with a pseudonormal left   ventricular filling pattern, with concomitant abnormal relaxation   and increased filling pressure (grade 2 diastolic dysfunction). - Aortic valve: There was no stenosis. - Mitral valve: Mildly calcified annulus. There was no significant   regurgitation. - Right ventricle: The cavity size was normal. Systolic function   was normal. - Pulmonary  arteries: No complete TR doppler jet so unable to   estimate PA systolic pressure. - Systemic veins: IVC measured 2.5 cm with < 50% respirophasic   variation, suggesting RA pressure 15 mmHg.  Impressions:  - Normal LV size with mild LV hypertrophy. Moderate diastolic   dysfunction. Normal RV size and systolic function. No significant   valvular abnormalities. Dilated IVC suggests elevated RV filling   pressure.  _____________   History of Present Illness     Shail Urbas a 58 y.o.malewith a history of CAD, DM, GERD who was admitted 1/18 with chest pain concerning for UA.  Hospital Course     Consultants: None  His cardiac enzymes were negative for MI.  His symptoms were relieved by combination nitrates and morphine.  On 1/20, he was taken to the Cath Lab with results above.  Medical therapy is recommended for generally nonobstructive disease, as well as a 90% PDA (small vessel).  He also had an echocardiogram which showed a normal EF with no wall motion abnormalities, but he had grade 2 diastolic dysfunction.  Good blood pressure control and good blood sugar control will be encouraged.  His goal LDL is now less than 70.  His diabetes medicines were not changed, but he had metoprolol 25 mg twice daily and Lipitor 80 mg daily added to his medication regimen.  He will need a follow-up lipid profile and liver function testing in 6-12 weeks.  Tobacco cessation will be encouraged.  He is on a nicotine patch.  On 1/20, Dr. Percival Spanish  evaluated Mr. Lorenson and reviewed all data.  No further inpatient work-up is indicated and he is considered stable for discharge, to follow-up as an outpatient.  _____________  Discharge Vitals Blood pressure (!) 146/76, pulse 72, temperature (!) 97.5 F (36.4 C), temperature source Oral, resp. rate (!) 21, height 5\' 9"  (1.753 m), weight 84.8 kg, SpO2 98 %.  Filed Weights   05/28/18 1702 05/29/18 0556 05/30/18 0500  Weight: 84.1 kg 84.1 kg  84.8 kg    Labs & Radiologic Studies    CBC Recent Labs    05/29/18 0351 05/30/18 0408  WBC 12.7* 11.3*  HGB 14.9 13.9  HCT 42.6 39.5  MCV 93.0 92.1  PLT 290 656   Basic Metabolic Panel Recent Labs    05/28/18 1737 05/29/18 0351 05/30/18 0408  NA 132* 128* 133*  K 4.1 4.6 4.6  CL 101 96* 104  CO2 20* 20* 20*  GLUCOSE 145* 288* 236*  BUN 9 14 12   CREATININE 1.09 1.15 0.92  CALCIUM 8.8* 8.6* 8.6*  MG 1.9  --   --    Liver Function Tests Recent Labs    05/28/18 1737  AST 15  ALT 13  ALKPHOS 78  BILITOT 1.1  PROT 6.6  ALBUMIN 3.4*    Cardiac Enzymes  Recent Labs    05/28/18 2334 05/29/18 0351 05/29/18 1022  TROPONINI <0.03 <0.03 <0.03    BNP    Component Value Date/Time   BNP 25.0 05/28/2018 1737   Hemoglobin A1C Lab Results  Component Value Date   HGBA1C 8.1 (H) 05/28/2018    Recent Labs    05/29/18 0351  CHOL 199  HDL 41  LDLCALC 129*  TRIG 144  CHOLHDL 4.9    _____________  No results found. Disposition   Pt is being discharged home today in good condition.  Follow-up Plans & Appointments    Follow-up Information    Hilty, Nadean Corwin, MD Follow up.   Specialty:  Cardiology Why:  The office will call. Contact information: [address] [place] [address] [phone]          Discharge Instructions    Diet - low sodium heart healthy   Complete by:  As directed    Diet Carb Modified   Complete by:  As directed    Increase activity slowly   Complete by:  As directed       Discharge Medications   Allergies as of 05/30/2018   No Known Allergies     Medication List    TAKE these medications   acetaminophen 325 MG tablet Commonly known as:  TYLENOL Take 650 mg by mouth 2 (two) times daily as needed for mild pain.   aspirin 81 MG EC tablet Take 1 tablet (81 mg total) by mouth daily. Start taking on:  May 31, 2018   atorvastatin 80 MG tablet Commonly known as:  LIPITOR Take 1 tablet  (80 mg total) by mouth daily at 6 PM. Start taking on:  May 31, 2018   insulin aspart 100 UNIT/ML injection Commonly known as:  novoLOG Inject 10 Units into the skin See admin instructions. Take 10 units every morning and based on sugar reading three other times daily take insulin based on sliding scale   insulin glargine 100 UNIT/ML injection Commonly known as:  LANTUS Inject 25 Units into the skin at bedtime.   metoprolol tartrate 25 MG tablet Commonly known as:  LOPRESSOR Take 1 tablet (25 mg total) by mouth  2 (two) times daily.   nicotine 14 mg/24hr patch Commonly known as:  NICODERM CQ - dosed in mg/24 hours Place 1 patch (14 mg total) onto the skin daily. Start taking on:  May 31, 2018   nitroGLYCERIN 0.4 MG SL tablet Commonly known as:  NITROSTAT Place 1 tablet (0.4 mg total) under the tongue every 5 (five) minutes x 3 doses as needed for chest pain.   omeprazole 20 MG capsule Commonly known as:  PRILOSEC Take 20 mg by mouth daily as needed (reflux).   Oxycodone HCl 10 MG Tabs Take 5 mg by mouth 2 (two) times daily.           Outstanding Labs/Studies   None  Duration of Discharge Encounter   Greater than 30 minutes including physician time.  Alcario Drought Barrett NP 05/30/2018, 7:09 PM

## 2018-05-31 ENCOUNTER — Encounter (HOSPITAL_COMMUNITY): Payer: Self-pay | Admitting: Cardiovascular Disease

## 2018-06-17 ENCOUNTER — Ambulatory Visit: Payer: Medicaid Other | Admitting: Cardiology

## 2018-06-20 DIAGNOSIS — M25559 Pain in unspecified hip: Secondary | ICD-10-CM | POA: Diagnosis not present

## 2018-06-20 DIAGNOSIS — E109 Type 1 diabetes mellitus without complications: Secondary | ICD-10-CM | POA: Diagnosis not present

## 2018-06-20 DIAGNOSIS — E119 Type 2 diabetes mellitus without complications: Secondary | ICD-10-CM | POA: Diagnosis not present

## 2018-06-20 DIAGNOSIS — I2510 Atherosclerotic heart disease of native coronary artery without angina pectoris: Secondary | ICD-10-CM | POA: Diagnosis not present

## 2019-01-26 DIAGNOSIS — E109 Type 1 diabetes mellitus without complications: Secondary | ICD-10-CM | POA: Diagnosis not present

## 2019-01-26 DIAGNOSIS — E1169 Type 2 diabetes mellitus with other specified complication: Secondary | ICD-10-CM | POA: Diagnosis not present

## 2019-01-26 DIAGNOSIS — Z1211 Encounter for screening for malignant neoplasm of colon: Secondary | ICD-10-CM | POA: Diagnosis not present

## 2019-01-26 DIAGNOSIS — K219 Gastro-esophageal reflux disease without esophagitis: Secondary | ICD-10-CM | POA: Diagnosis not present

## 2019-01-26 DIAGNOSIS — E782 Mixed hyperlipidemia: Secondary | ICD-10-CM | POA: Diagnosis not present

## 2019-03-09 DIAGNOSIS — Z23 Encounter for immunization: Secondary | ICD-10-CM | POA: Diagnosis not present

## 2019-04-12 DIAGNOSIS — Z86010 Personal history of colonic polyps: Secondary | ICD-10-CM | POA: Diagnosis not present

## 2019-04-12 DIAGNOSIS — D126 Benign neoplasm of colon, unspecified: Secondary | ICD-10-CM | POA: Diagnosis not present

## 2019-04-12 DIAGNOSIS — Z1211 Encounter for screening for malignant neoplasm of colon: Secondary | ICD-10-CM | POA: Diagnosis not present

## 2019-04-12 DIAGNOSIS — K635 Polyp of colon: Secondary | ICD-10-CM | POA: Diagnosis not present

## 2019-04-12 DIAGNOSIS — Z800 Family history of malignant neoplasm of digestive organs: Secondary | ICD-10-CM | POA: Diagnosis not present

## 2019-04-12 LAB — HM COLONOSCOPY

## 2019-05-02 DIAGNOSIS — R0789 Other chest pain: Secondary | ICD-10-CM | POA: Diagnosis not present

## 2019-05-02 DIAGNOSIS — K219 Gastro-esophageal reflux disease without esophagitis: Secondary | ICD-10-CM | POA: Diagnosis not present

## 2019-05-02 DIAGNOSIS — E109 Type 1 diabetes mellitus without complications: Secondary | ICD-10-CM | POA: Diagnosis not present

## 2019-05-02 DIAGNOSIS — E1169 Type 2 diabetes mellitus with other specified complication: Secondary | ICD-10-CM | POA: Diagnosis not present

## 2019-05-02 DIAGNOSIS — E782 Mixed hyperlipidemia: Secondary | ICD-10-CM | POA: Diagnosis not present

## 2019-05-02 DIAGNOSIS — I10 Essential (primary) hypertension: Secondary | ICD-10-CM | POA: Diagnosis not present

## 2019-05-30 DIAGNOSIS — L723 Sebaceous cyst: Secondary | ICD-10-CM | POA: Diagnosis not present

## 2019-05-30 DIAGNOSIS — Z1159 Encounter for screening for other viral diseases: Secondary | ICD-10-CM | POA: Diagnosis not present

## 2019-07-20 ENCOUNTER — Other Ambulatory Visit: Payer: Self-pay | Admitting: Physician Assistant

## 2019-07-20 ENCOUNTER — Other Ambulatory Visit: Payer: Self-pay

## 2019-07-20 MED ORDER — ATORVASTATIN CALCIUM 80 MG PO TABS: 80.0000 mg | ORAL_TABLET | Freq: Every day | ORAL | 0 refills | Status: DC

## 2019-07-20 MED ORDER — OMEPRAZOLE 20 MG PO CPDR: 20.0000 mg | DELAYED_RELEASE_CAPSULE | Freq: Every day | ORAL | 0 refills | Status: DC | PRN

## 2019-07-20 MED ORDER — RAMIPRIL 5 MG PO CAPS: 5.0000 mg | ORAL_CAPSULE | Freq: Every day | ORAL | 0 refills | Status: DC

## 2019-07-20 MED ORDER — RAMIPRIL 5 MG PO CAPS: 5.0000 mg | ORAL_CAPSULE | Freq: Once | ORAL | 0 refills | Status: DC

## 2019-08-03 ENCOUNTER — Ambulatory Visit (INDEPENDENT_AMBULATORY_CARE_PROVIDER_SITE_OTHER): Payer: Medicaid Other | Admitting: Physician Assistant

## 2019-08-03 ENCOUNTER — Other Ambulatory Visit: Payer: Self-pay

## 2019-08-03 ENCOUNTER — Encounter: Payer: Self-pay | Admitting: Physician Assistant

## 2019-08-03 VITALS — BP 160/80 | HR 100 | Temp 97.8°F | Resp 16 | Ht 70.0 in | Wt 197.0 lb

## 2019-08-03 DIAGNOSIS — E782 Mixed hyperlipidemia: Secondary | ICD-10-CM | POA: Diagnosis not present

## 2019-08-03 DIAGNOSIS — Z794 Long term (current) use of insulin: Secondary | ICD-10-CM | POA: Diagnosis not present

## 2019-08-03 DIAGNOSIS — E119 Type 2 diabetes mellitus without complications: Secondary | ICD-10-CM | POA: Diagnosis not present

## 2019-08-03 DIAGNOSIS — K219 Gastro-esophageal reflux disease without esophagitis: Secondary | ICD-10-CM | POA: Diagnosis not present

## 2019-08-03 DIAGNOSIS — I10 Essential (primary) hypertension: Secondary | ICD-10-CM | POA: Diagnosis not present

## 2019-08-03 HISTORY — DX: Essential (primary) hypertension: I10

## 2019-08-03 HISTORY — DX: Mixed hyperlipidemia: E78.2

## 2019-08-03 HISTORY — DX: Long term (current) use of insulin: Z79.4

## 2019-08-03 LAB — POCT URINALYSIS DIPSTICK
Bilirubin, UA: NEGATIVE
Blood, UA: POSITIVE
Glucose, UA: POSITIVE — AB
Ketones, UA: NEGATIVE
Leukocytes, UA: NEGATIVE
Nitrite, UA: NEGATIVE
Protein, UA: POSITIVE — AB
Spec Grav, UA: 1.015 (ref 1.010–1.025)
Urobilinogen, UA: NEGATIVE E.U./dL — AB
pH, UA: 6 (ref 5.0–8.0)

## 2019-08-03 LAB — POCT UA - MICROALBUMIN
Albumin/Creatinine Ratio, Urine, POC: 150
Creatinine, POC: NEGATIVE mg/dL
Microalbumin Ur, POC: NEGATIVE mg/L

## 2019-08-03 MED ORDER — MONTELUKAST SODIUM 10 MG PO TABS: 10.0000 mg | ORAL_TABLET | Freq: Every day | ORAL | 1 refills | Status: DC

## 2019-08-03 MED ORDER — METOPROLOL TARTRATE 25 MG PO TABS: 25.0000 mg | ORAL_TABLET | Freq: Every day | ORAL | 1 refills | Status: DC

## 2019-08-03 NOTE — Assessment & Plan Note (Signed)
Well controlled.  Restart metoprolol 25mg  qd Continue to work on eating a healthy diet and exercise.  Labs drawn today.

## 2019-08-03 NOTE — Assessment & Plan Note (Signed)
Well controlled.  ?No changes to medicines.  ?Continue to work on eating a healthy diet and exercise.  ?Labs drawn today.  ?

## 2019-08-03 NOTE — Progress Notes (Signed)
Established Patient Office Visit  Subjective:  Patient ID: Gabriel Gomez, male    DOB: [birth year]  Age: 59 y.o. MRN: [id number]  CC:  Chief Complaint  Patient presents with  . Follow-up  . Diabetes  . Hyperlipidemia    HPI Gabriel Gomez presents for follow up of diabetes and other health problems Gabriel Gomez presents with type 1 diabetes mellitus without complications.  Specifically, this is type 1 diabetes without complications.  Compliance with treatment has been good; he takes his medication as directed, maintains his diet and exercise regimen, follows up as directed, and is keeping a glucose diary.  He follows an 1800 calorie ADA diet.  He denies experiencing any diabetes related symptoms.  Depression screen is performed and is negative.   Tobacco screen: Current smoker.  Current meds include insulin/injectable ( Lantus 30 units qhs; NovoLog- sliding scale ).  He reports home blood glucose readings have been fairly good, with average fasting glucoses running in the 120-150 mg/dL range.  He is also on statin and altace 5mg  qd    Pt presents with hyperlipidemia.  Compliance with treatment has been good; he takes his medication as directed, maintains his low cholesterol diet, follows up as directed, and maintains his exercise regimen.  He denies experiencing any hypercholesterolemia related symptoms.  He is currently on lipitor 80mg  qd    Follow up of gastro-esophageal reflux disease without esophagitis.  pt states he is doing well on omeprazole 20mg  qd without breakthrough symptoms  Pt with history of hypertension - it was noted in his chart prior to Epic that he was supposed to be on metoprolol 25mg  - he states he has not been taking that medication - with the history of his bp now elevated, pulse elevated and history of unstable angina recommend to restart metoprolol 25mg  qd   Past Medical History:  Diagnosis Date  . Coronary artery disease   . Diabetes mellitus without complication  ([place])   . GERD (gastroesophageal reflux disease)     Past Surgical History:  Procedure Laterality Date  . LEFT HEART CATH AND CORONARY ANGIOGRAPHY N/A 05/30/2018   Procedure: LEFT HEART CATH AND CORONARY ANGIOGRAPHY;  Surgeon: Sherren Mocha, MD;  Location: [facility];  Service: Cardiovascular;  Laterality: N/A;    Family History  Problem Relation Age of Onset  . CAD Father     Social History   Socioeconomic History  . Marital status: Married    Spouse name: Not on file  . Number of children: Not on file  . Years of education: Not on file  . Highest education level: Not on file  Occupational History  . Not on file  Tobacco Use  . Smoking status: Current Every Day Smoker  . Smokeless tobacco: Never Used  Substance and Sexual Activity  . Alcohol use: Not on file  . Drug use: Not on file  . Sexual activity: Not on file  Other Topics Concern  . Not on file  Social History Narrative  . Not on file   Social Determinants of Health   Financial Resource Strain:   . Difficulty of Paying Living Expenses:   Food Insecurity:   . Worried About Charity fundraiser in the Last Year:   . Arboriculturist in the Last Year:   Transportation Needs:   . Film/video editor (Medical):   Marland Kitchen Lack of Transportation (Non-Medical):   Physical Activity:   . Days of Exercise per Week:   . Minutes  of Exercise per Session:   Stress:   . Feeling of Stress :   Social Connections:   . Frequency of Communication with Friends and Family:   . Frequency of Social Gatherings with Friends and Family:   . Attends Religious Services:   . Active Member of Clubs or Organizations:   . Attends Archivist Meetings:   Marland Kitchen Marital Status:   Intimate Partner Violence:   . Fear of Current or Ex-Partner:   . Emotionally Abused:   Marland Kitchen Physically Abused:   . Sexually Abused:      Current Outpatient Medications:  .  acetaminophen (TYLENOL) 325 MG tablet, Take 650 mg by mouth 2 (two) times  daily as needed for mild pain., Disp: , Rfl:  .  aspirin EC 81 MG EC tablet, Take 1 tablet (81 mg total) by mouth daily., Disp: , Rfl:  .  atorvastatin (LIPITOR) 80 MG tablet, Take 1 tablet (80 mg total) by mouth daily at 6 PM., Disp: 90 tablet, Rfl: 0 .  insulin aspart (NOVOLOG) 100 UNIT/ML injection, Inject 10 Units into the skin See admin instructions. Take 10 units every morning and based on sugar reading three other times daily take insulin based on sliding scale, Disp: , Rfl:  .  insulin glargine (LANTUS) 100 UNIT/ML injection, Inject 30 Units into the skin at bedtime., Disp: , Rfl:  .  metoprolol tartrate (LOPRESSOR) 25 MG tablet, Take 1 tablet (25 mg total) by mouth daily., Disp: 90 tablet, Rfl: 1 .  montelukast (SINGULAIR) 10 MG tablet, Take 1 tablet (10 mg total) by mouth at bedtime., Disp: 90 tablet, Rfl: 1 .  nitroGLYCERIN (NITROSTAT) 0.4 MG SL tablet, Place 1 tablet (0.4 mg total) under the tongue every 5 (five) minutes x 3 doses as needed for chest pain., Disp: 25 tablet, Rfl: 12 .  omeprazole (PRILOSEC) 20 MG capsule, Take 1 capsule (20 mg total) by mouth daily as needed (reflux)., Disp: 90 capsule, Rfl: 0 .  ramipril (ALTACE) 5 MG capsule, Take 1 capsule (5 mg total) by mouth daily., Disp: 90 capsule, Rfl: 0   No Known Allergies  ROS CONSTITUTIONAL: Negative for chills, fatigue, fever, unintentional weight gain and unintentional weight loss.  E/N/T: Negative for ear pain, nasal congestion and sore throat.  CARDIOVASCULAR: Negative for chest pain, dizziness, palpitations and pedal edema.  RESPIRATORY: Negative for recent cough and dyspnea.  GASTROINTESTINAL: Negative for abdominal pain, acid reflux symptoms, constipation, diarrhea, nausea and vomiting.  MSK: Negative for arthralgias and myalgias.  INTEGUMENTARY: Negative for rash.  NEUROLOGICAL: Negative for dizziness and headaches.  PSYCHIATRIC: Negative for sleep disturbance and to question depression screen.  Negative for  depression, negative for anhedonia.        Objective:    PHYSICAL EXAM:   VS: BP (!) 160/80   Pulse 100   Temp 97.8 F (36.6 C)   Resp 16   Ht 5\' 10"  (1.778 m)   Wt 197 lb (89.4 kg)   SpO2 98%   BMI 28.27 kg/m   GEN: Well nourished, well developed, in no acute distress  HEENT: normal external ears and nose - normal external auditory canals and TMS - hearing grossly normal - normal nasal mucosa and septum - Lips, Teeth and Gums - normal  Oropharynx - normal mucosa, palate, and posterior pharynx Neck: no JVD or masses - no thyromegaly Cardiac: RRR; no murmurs, rubs, or gallops,no edema - no significant varicosities Respiratory:  normal respiratory rate and pattern with no distress -  normal breath sounds with no rales, rhonchi, wheezes or rubs  Skin: warm and dry, no rash  Neuro:  Alert and Oriented x 3, Strength and sensation are intact - CN II-Xii grossly intact Psych: euthymic mood, appropriate affect and demeanor Office Visit on 08/03/2019  Component Date Value Ref Range Status  . Color, UA 08/03/2019 yellow   Final  . Clarity, UA 08/03/2019 clear   Final  . Glucose, UA 08/03/2019 Positive* Negative Final  . Bilirubin, UA 08/03/2019 neg   Final  . Ketones, UA 08/03/2019 neg   Final  . Spec Grav, UA 08/03/2019 1.015  1.010 - 1.025 Final  . Blood, UA 08/03/2019 pos   Final  . pH, UA 08/03/2019 6.0  5.0 - 8.0 Final  . Protein, UA 08/03/2019 Positive* Negative Final  . Urobilinogen, UA 08/03/2019 negative* 0.2 or 1.0 E.U./dL Final  . Nitrite, UA 08/03/2019 neg   Final  . Leukocytes, UA 08/03/2019 Negative  Negative Final  . Appearance 08/03/2019 clear   Final  . Odor 08/03/2019 none   Final  . Microalbumin Ur, POC 08/03/2019 neg  mg/L Final  . Creatinine, POC 08/03/2019 neg  mg/dL Final  . Albumin/Creatinine Ratio, Urine, P* 08/03/2019 150   Final  BP (!) 160/80   Pulse 100   Temp 97.8 F (36.6 C)   Resp 16   Ht 5\' 10"  (1.778 m)   Wt 197 lb (89.4 kg)   SpO2 98%   BMI 28.27 kg/m  Wt Readings from Last 3 Encounters:  08/03/19 197 lb (89.4 kg)  05/30/18 186 lb 14.4 oz (84.8 kg)     Health Maintenance Due  Topic Date Due  . Hepatitis C Screening  Never done  . PNEUMOCOCCAL POLYSACCHARIDE VACCINE AGE 88-64 HIGH RISK  Never done  . FOOT EXAM  Never done  . OPHTHALMOLOGY EXAM  Never done  . TETANUS/TDAP  Never done  . COLONOSCOPY  Never done  . HEMOGLOBIN A1C  11/26/2018    There are no preventive care reminders to display for this patient.  No results found for: TSH Lab Results  Component Value Date   WBC 11.3 (H) 05/30/2018   HGB 13.9 05/30/2018   HCT 39.5 05/30/2018   MCV 92.1 05/30/2018   PLT 262 05/30/2018   Lab Results  Component Value Date   NA 133 (L) 05/30/2018   K 4.6 05/30/2018   CO2 20 (L) 05/30/2018   GLUCOSE 236 (H) 05/30/2018   BUN 12 05/30/2018   CREATININE 0.92 05/30/2018   BILITOT 1.1 05/28/2018   ALKPHOS 78 05/28/2018   AST 15 05/28/2018   ALT 13 05/28/2018   PROT 6.6 05/28/2018   ALBUMIN 3.4 (L) 05/28/2018   CALCIUM 8.6 (L)  05/30/2018   ANIONGAP 9 05/30/2018   Lab Results  Component Value Date   CHOL 199 05/29/2018   Lab Results  Component Value Date   HDL 41 05/29/2018   Lab Results  Component Value Date   LDLCALC 129 (H) 05/29/2018   Lab Results  Component Value Date   TRIG 144 05/29/2018   Lab Results  Component Value Date   CHOLHDL 4.9 05/29/2018   Lab Results  Component Value Date   HGBA1C 8.1 (H) 05/28/2018      Assessment & Plan:   Problem List Items Addressed This Visit      Digestive   GERD (gastroesophageal reflux disease) - Primary     Endocrine   Type 2 diabetes mellitus without complication, with long-term current use of insulin (HCC)   Relevant Orders   Hemoglobin A1c     Other   Mixed hyperlipidemia   Relevant Medications   metoprolol tartrate (LOPRESSOR) 25 MG tablet   Other Relevant Orders   CBC with Differential/Platelet   Comprehensive metabolic panel   TSH   Lipid panel    Other Visit Diagnoses    Essential hypertension, benign       Relevant Medications   metoprolol tartrate (LOPRESSOR) 25 MG tablet   Other Relevant Orders   CBC with Differential/Platelet   Comprehensive metabolic panel      Meds ordered this encounter  Medications  . metoprolol tartrate (LOPRESSOR) 25 MG tablet    Sig: Take 1 tablet (25 mg total) by mouth daily.    Dispense:  90 tablet    Refill:  1    Order Specific Question:   Supervising Provider    AnswerRochel Brome [id number]  . montelukast (SINGULAIR) 10 MG tablet    Sig: Take 1 tablet (10 mg total) by mouth at bedtime.    Dispense:  90 tablet    Refill:  1    Order Specific Question:   Supervising Provider    Answer:   COX,  Elnita Maxwell [id number]    Follow-up: Return in about 3 months (around 11/03/2019) for chronic fasting follow up .    SARA R Lasean Gorniak, PA-C

## 2019-08-03 NOTE — Assessment & Plan Note (Signed)
Continue meds as directed

## 2019-08-04 ENCOUNTER — Other Ambulatory Visit: Payer: Self-pay | Admitting: Physician Assistant

## 2019-08-04 DIAGNOSIS — R799 Abnormal finding of blood chemistry, unspecified: Secondary | ICD-10-CM

## 2019-08-04 LAB — COMPREHENSIVE METABOLIC PANEL
ALT: 25 IU/L (ref 0–44)
AST: 26 IU/L (ref 0–40)
Albumin/Globulin Ratio: 1.8 (ref 1.2–2.2)
Albumin: 4.4 g/dL (ref 3.8–4.8)
Alkaline Phosphatase: 104 IU/L (ref 39–117)
BUN/Creatinine Ratio: 8 — ABNORMAL LOW (ref 10–24)
BUN: 8 mg/dL (ref 8–27)
Bilirubin Total: 0.8 mg/dL (ref 0.0–1.2)
CO2: 21 mmol/L (ref 20–29)
Calcium: 9.7 mg/dL (ref 8.6–10.2)
Chloride: 95 mmol/L — ABNORMAL LOW (ref 96–106)
Creatinine, Ser: 1 mg/dL (ref 0.76–1.27)
GFR calc Af Amer: 93 mL/min/{1.73_m2} (ref >59)
GFR calc non Af Amer: 81 mL/min/{1.73_m2} (ref >59)
Globulin, Total: 2.4 g/dL (ref 1.5–4.5)
Glucose: 133 mg/dL — ABNORMAL HIGH (ref 65–99)
Potassium: 5.9 mmol/L — ABNORMAL HIGH (ref 3.5–5.2)
Sodium: 129 mmol/L — ABNORMAL LOW (ref 134–144)
Total Protein: 6.8 g/dL (ref 6.0–8.5)

## 2019-08-04 LAB — CBC WITH DIFFERENTIAL/PLATELET
Basophils Absolute: 0.1 10*3/uL (ref 0.0–0.2)
Basos: 1 %
EOS (ABSOLUTE): 0.2 10*3/uL (ref 0.0–0.4)
Eos: 2 %
Hematocrit: 42.9 % (ref 37.5–51.0)
Hemoglobin: 14.8 g/dL (ref 13.0–17.7)
Immature Grans (Abs): 0 10*3/uL (ref 0.0–0.1)
Immature Granulocytes: 0 %
Lymphocytes Absolute: 2.6 10*3/uL (ref 0.7–3.1)
Lymphs: 22 %
MCH: 32.9 pg (ref 26.6–33.0)
MCHC: 34.5 g/dL (ref 31.5–35.7)
MCV: 95 fL (ref 79–97)
Monocytes Absolute: 0.9 10*3/uL (ref 0.1–0.9)
Monocytes: 7 %
Neutrophils Absolute: 8.1 10*3/uL — ABNORMAL HIGH (ref 1.4–7.0)
Neutrophils: 68 %
Platelets: 333 10*3/uL (ref 150–450)
RBC: 4.5 x10E6/uL (ref 4.14–5.80)
RDW: 12.5 % (ref 11.6–15.4)
WBC: 11.9 10*3/uL — ABNORMAL HIGH (ref 3.4–10.8)

## 2019-08-04 LAB — LIPID PANEL
Chol/HDL Ratio: 2.4 ratio (ref 0.0–5.0)
Cholesterol, Total: 157 mg/dL (ref 100–199)
HDL: 65 mg/dL (ref >39)
LDL Chol Calc (NIH): 79 mg/dL (ref 0–99)
Triglycerides: 67 mg/dL (ref 0–149)
VLDL Cholesterol Cal: 13 mg/dL (ref 5–40)

## 2019-08-04 LAB — CARDIOVASCULAR RISK ASSESSMENT

## 2019-08-04 LAB — TSH: TSH: 1.57 u[IU]/mL (ref 0.450–4.500)

## 2019-08-04 LAB — HEMOGLOBIN A1C
Est. average glucose Bld gHb Est-mCnc: 174 mg/dL
Hgb A1c MFr Bld: 7.7 % — ABNORMAL HIGH (ref 4.8–5.6)

## 2019-08-10 ENCOUNTER — Telehealth: Payer: Self-pay

## 2019-08-10 NOTE — Telephone Encounter (Signed)
Patient's daughter called to check on result from recent labs.  Result given per provider note, appointment made for CMP recheck.  Verified patient phone number is correct in chart.

## 2019-08-15 DIAGNOSIS — Z23 Encounter for immunization: Secondary | ICD-10-CM | POA: Diagnosis not present

## 2019-08-18 ENCOUNTER — Other Ambulatory Visit: Payer: Medicaid Other

## 2019-08-18 ENCOUNTER — Other Ambulatory Visit: Payer: Self-pay

## 2019-08-18 DIAGNOSIS — R799 Abnormal finding of blood chemistry, unspecified: Secondary | ICD-10-CM | POA: Diagnosis not present

## 2019-08-18 LAB — COMPREHENSIVE METABOLIC PANEL
ALT: 38 IU/L (ref 0–44)
AST: 35 IU/L (ref 0–40)
Albumin/Globulin Ratio: 1.8 (ref 1.2–2.2)
Albumin: 4.3 g/dL (ref 3.8–4.8)
Alkaline Phosphatase: 96 IU/L (ref 39–117)
BUN/Creatinine Ratio: 8 — ABNORMAL LOW (ref 10–24)
BUN: 8 mg/dL (ref 8–27)
Bilirubin Total: 0.5 mg/dL (ref 0.0–1.2)
CO2: 20 mmol/L (ref 20–29)
Calcium: 9.6 mg/dL (ref 8.6–10.2)
Chloride: 97 mmol/L (ref 96–106)
Creatinine, Ser: 1.03 mg/dL (ref 0.76–1.27)
GFR calc Af Amer: 90 mL/min/{1.73_m2} (ref >59)
GFR calc non Af Amer: 78 mL/min/{1.73_m2} (ref >59)
Globulin, Total: 2.4 g/dL (ref 1.5–4.5)
Glucose: 147 mg/dL — ABNORMAL HIGH (ref 65–99)
Potassium: 5.1 mmol/L (ref 3.5–5.2)
Sodium: 132 mmol/L — ABNORMAL LOW (ref 134–144)
Total Protein: 6.7 g/dL (ref 6.0–8.5)

## 2019-08-28 DIAGNOSIS — M25552 Pain in left hip: Secondary | ICD-10-CM | POA: Diagnosis not present

## 2019-08-28 DIAGNOSIS — M5416 Radiculopathy, lumbar region: Secondary | ICD-10-CM | POA: Diagnosis not present

## 2019-08-28 DIAGNOSIS — M5126 Other intervertebral disc displacement, lumbar region: Secondary | ICD-10-CM | POA: Diagnosis not present

## 2019-08-31 DIAGNOSIS — M5416 Radiculopathy, lumbar region: Secondary | ICD-10-CM | POA: Diagnosis not present

## 2019-09-09 DIAGNOSIS — M545 Low back pain: Secondary | ICD-10-CM | POA: Diagnosis not present

## 2019-09-09 DIAGNOSIS — M4807 Spinal stenosis, lumbosacral region: Secondary | ICD-10-CM | POA: Diagnosis not present

## 2019-09-09 DIAGNOSIS — M5116 Intervertebral disc disorders with radiculopathy, lumbar region: Secondary | ICD-10-CM | POA: Diagnosis not present

## 2019-09-12 DIAGNOSIS — M5126 Other intervertebral disc displacement, lumbar region: Secondary | ICD-10-CM | POA: Diagnosis not present

## 2019-10-20 ENCOUNTER — Other Ambulatory Visit: Payer: Self-pay | Admitting: Family Medicine

## 2019-10-20 ENCOUNTER — Other Ambulatory Visit: Payer: Self-pay | Admitting: Physician Assistant

## 2019-10-25 ENCOUNTER — Other Ambulatory Visit: Payer: Self-pay | Admitting: Physician Assistant

## 2019-11-06 ENCOUNTER — Other Ambulatory Visit: Payer: Self-pay

## 2019-11-06 ENCOUNTER — Encounter: Payer: Self-pay | Admitting: Physician Assistant

## 2019-11-06 ENCOUNTER — Ambulatory Visit (INDEPENDENT_AMBULATORY_CARE_PROVIDER_SITE_OTHER): Payer: Medicaid Other | Admitting: Physician Assistant

## 2019-11-06 ENCOUNTER — Other Ambulatory Visit: Payer: Self-pay | Admitting: Physician Assistant

## 2019-11-06 VITALS — BP 138/68 | HR 76 | Temp 97.9°F | Ht 70.0 in | Wt 197.0 lb

## 2019-11-06 DIAGNOSIS — M5442 Lumbago with sciatica, left side: Secondary | ICD-10-CM

## 2019-11-06 DIAGNOSIS — E119 Type 2 diabetes mellitus without complications: Secondary | ICD-10-CM

## 2019-11-06 DIAGNOSIS — R3121 Asymptomatic microscopic hematuria: Secondary | ICD-10-CM

## 2019-11-06 DIAGNOSIS — E782 Mixed hyperlipidemia: Secondary | ICD-10-CM

## 2019-11-06 DIAGNOSIS — Z23 Encounter for immunization: Secondary | ICD-10-CM | POA: Diagnosis not present

## 2019-11-06 DIAGNOSIS — I10 Essential (primary) hypertension: Secondary | ICD-10-CM

## 2019-11-06 DIAGNOSIS — Z794 Long term (current) use of insulin: Secondary | ICD-10-CM | POA: Diagnosis not present

## 2019-11-06 HISTORY — DX: Asymptomatic microscopic hematuria: R31.21

## 2019-11-06 HISTORY — DX: Encounter for immunization: Z23

## 2019-11-06 HISTORY — DX: Lumbago with sciatica, left side: M54.42

## 2019-11-06 LAB — POCT URINALYSIS DIPSTICK
Bilirubin, UA: NEGATIVE
Blood, UA: NEGATIVE
Glucose, UA: NEGATIVE
Ketones, UA: NEGATIVE
Leukocytes, UA: NEGATIVE
Nitrite, UA: NEGATIVE
Protein, UA: POSITIVE — AB
Spec Grav, UA: 1.015 (ref 1.010–1.025)
Urobilinogen, UA: NEGATIVE E.U./dL — AB
pH, UA: 6 (ref 5.0–8.0)

## 2019-11-06 NOTE — Assessment & Plan Note (Signed)
Well controlled.  ?No changes to medicines.  ?Continue to work on eating a healthy diet and exercise.  ?Labs drawn today.  ?

## 2019-11-06 NOTE — Progress Notes (Signed)
Established Patient Office Visit  Subjective:  Patient ID: Gabriel Gomez, male    DOB: August 05, 1960  Age: 59 y.o. MRN: [id number]  CC:  Chief Complaint  Patient presents with  . Diabetes    HPI Gabriel Gomez presents for follow up of diabetes and other health problems Gabriel Gomez presents with type 1 diabetes mellitus without complications.  Specifically, this is type 1 diabetes without complications.  Compliance with treatment has been good; he takes his medication as directed, maintains his diet and exercise regimen, follows up as directed, and is keeping a glucose diary.  He follows an 1800 calorie ADA diet.  He denies experiencing any diabetes related symptoms.  Depression screen is performed and is negative.   Tobacco screen: Current smoker.  Current meds include insulin/injectable ( Lantus 30 units qhs; NovoLog- sliding scale ).  He reports home blood glucose readings have been fairly good, with average fasting glucoses running in the 120-150 mg/dL range.  He is also on statin and altace 5mg  qd - was recently on steroids for back pain and glucose increased during that time    Pt presents with hyperlipidemia.  Compliance with treatment has been good; he takes his medication as directed, maintains his low cholesterol diet, follows up as directed, and maintains his exercise regimen.  He denies experiencing any hypercholesterolemia related symptoms.  He is currently on lipitor 80mg  qd    Follow up of gastro-esophageal reflux disease without esophagitis.  pt states he is doing well on omeprazole 20mg  qd without breakthrough symptoms  Pt with history of hypertension - pt has restarted the metoprolol and is doing well - voices no problems or concerns  Pt states he had been to ED and was found to have bulging disc in his back - he has now seen ortho and been placed on gabapentin as well as diclofenac - he states he is still having pain and ortho suggested either steroid injections or pain clinic  - pt would like to have neurosurgeon consult  Pt mentions at last visit he had blood in his urine - due to recheck Past Medical History:  Diagnosis Date  . Coronary artery disease   . Diabetes mellitus without complication ([place])   . GERD (gastroesophageal reflux disease)     Past Surgical History:  Procedure Laterality Date  . LEFT HEART CATH AND CORONARY ANGIOGRAPHY N/A 05/30/2018   Procedure: LEFT HEART CATH AND CORONARY ANGIOGRAPHY;  Surgeon: Sherren Mocha, MD;  Location: [facility];  Service: Cardiovascular;  Laterality: N/A;    Family History  Problem Relation Age of Onset  . CAD Father     Social History   Socioeconomic History  . Marital status: Married    Spouse name: Not on file  . Number of children: Not on file  . Years of education: Not on file  . Highest education level: Not on file  Occupational History  . Not on file  Tobacco Use  . Smoking status: Current Every Day Smoker  . Smokeless tobacco: Never Used  Substance and Sexual Activity  . Alcohol use: Not on file  . Drug use: Not on file  . Sexual activity: Not on file  Other Topics Concern  . Not on file  Social History Narrative  . Not on file   Social Determinants of Health   Financial Resource Strain:   . Difficulty of Paying Living Expenses:   Food Insecurity:   . Worried About Charity fundraiser in the Last Year:   .  Ran Out of Food in the Last Year:   Transportation Needs:   . Film/video editor (Medical):   Marland Kitchen Lack of Transportation (Non-Medical):   Physical Activity:   . Days of Exercise per Week:   . Minutes of Exercise per Session:   Stress:   . Feeling of Stress :   Social Connections:   . Frequency of Communication with Friends and Family:   . Frequency of Social Gatherings with Friends and Family:   . Attends Religious Services:   . Active Member of Clubs or Organizations:   . Attends Archivist Meetings:   Marland Kitchen Marital Status:   Intimate Partner  Violence:   . Fear of Current or Ex-Partner:   . Emotionally Abused:   Marland Kitchen Physically Abused:   . Sexually Abused:      Current Outpatient Medications:  .  acetaminophen (TYLENOL) 325 MG tablet, Take 650 mg by mouth 2 (two) times daily as needed for mild pain., Disp: , Rfl:  .  aspirin EC 81 MG EC tablet, Take 1 tablet (81 mg total) by mouth daily., Disp: , Rfl:  .  atorvastatin (LIPITOR) 80 MG tablet, TAKE ONE TABLET BY MOUTH ONCE DAILY AT 6pm, Disp: 90 tablet, Rfl: 0 .  diclofenac (VOLTAREN) 75 MG EC tablet, Take 75 mg by mouth 2 (two) times daily., Disp: , Rfl:  .  gabapentin (NEURONTIN) 300 MG capsule, Take 300 mg by mouth 3 (three) times daily., Disp: , Rfl:  .  insulin aspart (NOVOLOG) 100 UNIT/ML injection, Inject 10 Units into the skin See admin instructions. Take 10 units every morning and based on sugar reading three other times daily take insulin based on sliding scale, Disp: , Rfl:  .  insulin glargine (LANTUS) 100 UNIT/ML injection, Inject 30 Units into the skin at bedtime., Disp: , Rfl:  .  LANTUS SOLOSTAR 100 UNIT/ML Solostar Pen, INJECT 25 units subcutaneously PER insulin protocol, Disp: 15 mL, Rfl: 2 .  metoprolol tartrate (LOPRESSOR) 25 MG tablet, Take 1 tablet (25 mg total) by mouth daily., Disp: 90 tablet, Rfl: 1 .  montelukast (SINGULAIR) 10 MG tablet, Take 1 tablet (10 mg total) by mouth at bedtime., Disp: 90 tablet, Rfl: 1 .  nitroGLYCERIN (NITROSTAT) 0.4 MG SL tablet, Place 1 tablet (0.4 mg total) under the tongue every 5 (five) minutes x 3 doses as needed for chest pain., Disp: 25 tablet, Rfl: 12 .  omeprazole (PRILOSEC) 20 MG capsule, TAKE ONE CAPSULE BY MOUTH ONCE DAILY AS NEEDED FOR reflux, Disp: 90 capsule, Rfl: 0 .  ramipril (ALTACE) 5 MG capsule, TAKE ONE CAPSULE BY MOUTH ONCE DAILY, Disp: 90 capsule, Rfl: 0 .  UNIFINE PENTIPS 32G X 4 MM MISC, AS DIRECTED FOUR TIMES DAILY, Disp: 100 each, Rfl: 2   No Known Allergies  ROS CONSTITUTIONAL: Negative for chills,  fatigue, fever, unintentional weight gain and unintentional weight loss.  E/N/T: Negative for ear pain, nasal congestion and sore throat.  CARDIOVASCULAR: Negative for chest pain, dizziness, palpitations and pedal edema.  RESPIRATORY: Negative for recent cough and dyspnea.  GASTROINTESTINAL: Negative for abdominal pain, acid reflux symptoms, constipation, diarrhea, nausea and vomiting.  MSK: see HPI INTEGUMENTARY: Negative for rash.  NEUROLOGICAL: Negative for dizziness and headaches.  PSYCHIATRIC: Negative for sleep disturbance and to question depression screen.  Negative for depression, negative for anhedonia.        Objective:    PHYSICAL EXAM:   VS: BP 138/68 (BP Location: Left Arm, Patient Position: Sitting)  Pulse 76   Temp 97.9 F (36.6 C) (Temporal)   Ht 5\' 10"  (1.778 m)   Wt 197 lb (89.4 kg)   SpO2 96%   BMI 28.27 kg/m   GEN: Well nourished, well developed, in no acute distress  Cardiac: RRR; no murmurs, rubs, or gallops,no edema - no significant varicosities Respiratory:  normal respiratory rate and pattern with no distress - normal breath sounds with no rales, rhonchi, wheezes or rubs  Skin: warm and dry, no rash  Neuro:  Alert and Oriented x 3, Strength and sensation are intact - CN II-Xii grossly intact Psych: euthymic mood, appropriate affect and demeanor  Office Visit on 11/06/2019  Component Date Value Ref Range Status  . Glucose, UA 11/06/2019 Negative  Negative Final  . Bilirubin, UA 11/06/2019 Negative   Final  . Ketones, UA 11/06/2019 Negative   Final  . Spec Grav, UA 11/06/2019 1.015  1.010 - 1.025 Final  . Blood, UA 11/06/2019 Negatie   Final  . pH, UA 11/06/2019 6.0  5.0 - 8.0 Final  . Protein, UA 11/06/2019 Positive* Negative Final  . Urobilinogen, UA 11/06/2019 negative* 0.2 or 1.0 E.U./dL Final  . Nitrite, UA 11/06/2019 Negative   Final  . Leukocytes, UA 11/06/2019 Negative  Negative Final    Diabetic Foot Exam - Simple   Simple Foot  Form Diabetic Foot exam was performed with the following findings: Yes 11/06/2019  8:41 AM  Visual Inspection No deformities, no ulcerations, no other skin breakdown bilaterally: Yes Sensation Testing Intact to touch and monofilament testing bilaterally: Yes Pulse Check Posterior Tibialis and Dorsalis pulse intact bilaterally: Yes Comments     No visits with results within 1 Day(s) from this visit.  Latest known visit with results is:  Appointment on 08/18/2019  Component Date Value Ref Range Status  . Glucose 08/18/2019 147* 65 - 99 mg/dL Final  . BUN 08/18/2019 8  8 - 27 mg/dL Final  . Creatinine, Ser 08/18/2019 1.03  0.76 - 1.27 mg/dL Final  . GFR calc non Af Amer 08/18/2019 78  >59 mL/min/1.73 Final  . GFR calc Af Amer 08/18/2019 90  >59 mL/min/1.73 Final  . BUN/Creatinine Ratio 08/18/2019 8* 10 - 24 Final  . Sodium 08/18/2019 132* 134 - 144 mmol/L Final  . Potassium 08/18/2019 5.1  3.5 - 5.2 mmol/L Final  . Chloride 08/18/2019 97  96 - 106 mmol/L Final  . CO2 08/18/2019 20  20 - 29 mmol/L Final  . Calcium 08/18/2019 9.6  8.6 - 10.2 mg/dL Final  . Total Protein 08/18/2019 6.7  6.0 - 8.5 g/dL Final  . Albumin 08/18/2019 4.3  3.8 - 4.8 g/dL Final  . Globulin, Total 08/18/2019 2.4  1.5 - 4.5 g/dL Final  . Albumin/Globulin Ratio 08/18/2019 1.8  1.2 - 2.2 Final  . Bilirubin Total 08/18/2019 0.5  0.0 - 1.2 mg/dL Final  . Alkaline Phosphatase 08/18/2019 96  39 - 117 IU/L Final  . AST 08/18/2019 35  0 - 40 IU/L Final  . ALT 08/18/2019 38  0 - 44 IU/L Final  BP 138/68 (BP Location: Left Arm, Patient Position: Sitting)   Pulse 76   Temp 97.9 F (36.6 C) (Temporal)   Ht 5\' 10"  (1.778 m)   Wt 197 lb (89.4 kg)   SpO2 96%   BMI 28.27 kg/m  Wt Readings from Last 3 Encounters:  11/06/19 197 lb (89.4 kg)  08/03/19 197 lb (89.4 kg)  05/30/18 186 lb 14.4 oz (84.8 kg)     Health Maintenance Due  Topic Date Due  . Hepatitis C Screening  Never done  . OPHTHALMOLOGY EXAM  Never done  . TETANUS/TDAP  Never done  . COLONOSCOPY  Never done    There are no preventive care reminders to display for this patient.  Lab Results  Component Value Date   TSH 1.570 08/03/2019   Lab Results  Component Value Date   WBC 11.9 (H) 08/03/2019   HGB 14.8 08/03/2019   HCT 42.9 08/03/2019   MCV 95 08/03/2019   PLT 333 08/03/2019   Lab Results  Component Value Date   NA 132 (L) 08/18/2019   K 5.1 08/18/2019   CO2 20 08/18/2019   GLUCOSE 147 (H) 08/18/2019   BUN 8 08/18/2019   CREATININE 1.03 08/18/2019   BILITOT 0.5 08/18/2019   ALKPHOS 96 08/18/2019   AST 35 08/18/2019   ALT 38 08/18/2019   PROT 6.7 08/18/2019   ALBUMIN 4.3 08/18/2019   CALCIUM 9.6 08/18/2019   ANIONGAP 9 05/30/2018   Lab Results  Component Value Date   CHOL 157 08/03/2019   Lab Results  Component Value Date   HDL 65 08/03/2019   Lab Results  Component Value Date   LDLCALC 79 08/03/2019   Lab Results  Component Value Date   TRIG 67 08/03/2019   Lab Results  Component Value Date   CHOLHDL 2.4 08/03/2019    Lab Results  Component Value Date   HGBA1C 7.7 (H) 08/03/2019      Assessment & Plan:   Problem List Items Addressed This Visit      Cardiovascular and Mediastinum   Essential hypertension, benign - Primary    Well controlled.  No changes to medicines.  Continue to work on eating a healthy diet and exercise.  Labs drawn today.        Relevant Orders   CBC with Differential/Platelet   Comprehensive metabolic panel     Endocrine   Type 2 diabetes mellitus without complication, with long-term current use of insulin (HCC)    Well controlled.  No changes to medicines.  Continue to work on eating a healthy diet and exercise.  Labs drawn today.        Relevant Orders   Hemoglobin A1c     Genitourinary   Asymptomatic microscopic hematuria    cleared      Relevant Orders   POCT urinalysis dipstick     Other   Mixed hyperlipidemia    Well controlled.  No changes to medicines.  Continue to work on eating a healthy diet and exercise.  Labs drawn today.        Relevant Orders   Lipid panel   Need for tetanus, diphtheria, and acellular pertussis (Tdap) vaccine    Tdap given      Relevant Orders   Tdap vaccine greater than or equal to 7yo IM   Acute left-sided low back pain with left-sided sciatica    Will request MRI results and plan to refer to neurosurgeon      Relevant Medications  diclofenac (VOLTAREN) 75 MG EC tablet      No orders of the defined types were placed in this encounter.   Follow-up: Return in about 3 months (around 02/06/2020) for chronic fasting.    SARA R Ariah Mower, PA-C

## 2019-11-06 NOTE — Assessment & Plan Note (Signed)
Tdap given.  

## 2019-11-06 NOTE — Assessment & Plan Note (Signed)
cleared

## 2019-11-06 NOTE — Assessment & Plan Note (Signed)
Will request MRI results and plan to refer to neurosurgeon

## 2019-11-09 ENCOUNTER — Other Ambulatory Visit: Payer: Self-pay | Admitting: Physician Assistant

## 2019-11-09 ENCOUNTER — Encounter: Payer: Self-pay | Admitting: Physician Assistant

## 2019-11-09 DIAGNOSIS — R899 Unspecified abnormal finding in specimens from other organs, systems and tissues: Secondary | ICD-10-CM

## 2019-11-09 LAB — LIPID PANEL
Chol/HDL Ratio: 3.6 ratio (ref 0.0–5.0)
Cholesterol, Total: 162 mg/dL (ref 100–199)
HDL: 45 mg/dL (ref >39)
LDL Chol Calc (NIH): 88 mg/dL (ref 0–99)
Triglycerides: 169 mg/dL — ABNORMAL HIGH (ref 0–149)
VLDL Cholesterol Cal: 29 mg/dL (ref 5–40)

## 2019-11-09 LAB — COMPREHENSIVE METABOLIC PANEL
ALT: 29 IU/L (ref 0–44)
AST: 23 IU/L (ref 0–40)
Albumin/Globulin Ratio: 1.6 (ref 1.2–2.2)
Albumin: 4.2 g/dL (ref 3.8–4.8)
Alkaline Phosphatase: 93 IU/L (ref 48–121)
BUN/Creatinine Ratio: 9 — ABNORMAL LOW (ref 10–24)
BUN: 10 mg/dL (ref 8–27)
Bilirubin Total: 0.8 mg/dL (ref 0.0–1.2)
CO2: 22 mmol/L (ref 20–29)
Calcium: 9.7 mg/dL (ref 8.6–10.2)
Chloride: 96 mmol/L (ref 96–106)
Creatinine, Ser: 1.15 mg/dL (ref 0.76–1.27)
GFR calc Af Amer: 79 mL/min/{1.73_m2} (ref >59)
GFR calc non Af Amer: 68 mL/min/{1.73_m2} (ref >59)
Globulin, Total: 2.7 g/dL (ref 1.5–4.5)
Glucose: 163 mg/dL — ABNORMAL HIGH (ref 65–99)
Potassium: 5.9 mmol/L — ABNORMAL HIGH (ref 3.5–5.2)
Sodium: 132 mmol/L — ABNORMAL LOW (ref 134–144)
Total Protein: 6.9 g/dL (ref 6.0–8.5)

## 2019-11-09 LAB — CBC WITH DIFFERENTIAL/PLATELET
Basophils Absolute: 0.1 10*3/uL (ref 0.0–0.2)
Basos: 1 %
EOS (ABSOLUTE): 0.3 10*3/uL (ref 0.0–0.4)
Eos: 3 %
Hematocrit: 42.6 % (ref 37.5–51.0)
Hemoglobin: 14.5 g/dL (ref 13.0–17.7)
Immature Grans (Abs): 0.1 10*3/uL (ref 0.0–0.1)
Immature Granulocytes: 1 %
Lymphocytes Absolute: 2.3 10*3/uL (ref 0.7–3.1)
Lymphs: 22 %
MCH: 33.3 pg — ABNORMAL HIGH (ref 26.6–33.0)
MCHC: 34 g/dL (ref 31.5–35.7)
MCV: 98 fL — ABNORMAL HIGH (ref 79–97)
Monocytes Absolute: 1 10*3/uL — ABNORMAL HIGH (ref 0.1–0.9)
Monocytes: 10 %
Neutrophils Absolute: 6.6 10*3/uL (ref 1.4–7.0)
Neutrophils: 63 %
Platelets: 359 10*3/uL (ref 150–450)
RBC: 4.36 x10E6/uL (ref 4.14–5.80)
RDW: 13.1 % (ref 11.6–15.4)
WBC: 10.5 10*3/uL (ref 3.4–10.8)

## 2019-11-09 LAB — CARDIOVASCULAR RISK ASSESSMENT

## 2019-11-09 LAB — HEMOGLOBIN A1C
Est. average glucose Bld gHb Est-mCnc: 174 mg/dL
Hgb A1c MFr Bld: 7.7 % — ABNORMAL HIGH (ref 4.8–5.6)

## 2019-11-15 ENCOUNTER — Ambulatory Visit: Payer: Medicaid Other | Admitting: Physician Assistant

## 2019-11-15 ENCOUNTER — Encounter: Payer: Self-pay | Admitting: Physician Assistant

## 2019-11-15 ENCOUNTER — Ambulatory Visit: Payer: Medicaid Other

## 2019-11-15 ENCOUNTER — Other Ambulatory Visit: Payer: Self-pay

## 2019-11-15 VITALS — BP 110/62 | HR 76 | Temp 97.9°F | Ht 70.0 in | Wt 201.0 lb

## 2019-11-15 DIAGNOSIS — R899 Unspecified abnormal finding in specimens from other organs, systems and tissues: Secondary | ICD-10-CM

## 2019-11-15 DIAGNOSIS — M5442 Lumbago with sciatica, left side: Secondary | ICD-10-CM

## 2019-11-15 DIAGNOSIS — Z125 Encounter for screening for malignant neoplasm of prostate: Secondary | ICD-10-CM

## 2019-11-15 HISTORY — DX: Encounter for screening for malignant neoplasm of prostate: Z12.5

## 2019-11-15 HISTORY — DX: Unspecified abnormal finding in specimens from other organs, systems and tissues: R89.9

## 2019-11-15 LAB — COMPREHENSIVE METABOLIC PANEL
ALT: 26 IU/L (ref 0–44)
AST: 22 IU/L (ref 0–40)
Albumin/Globulin Ratio: 1.9 (ref 1.2–2.2)
Albumin: 4.4 g/dL (ref 3.8–4.8)
Alkaline Phosphatase: 94 IU/L (ref 48–121)
BUN/Creatinine Ratio: 9 — ABNORMAL LOW (ref 10–24)
BUN: 10 mg/dL (ref 8–27)
Bilirubin Total: 0.7 mg/dL (ref 0.0–1.2)
CO2: 21 mmol/L (ref 20–29)
Calcium: 9.3 mg/dL (ref 8.6–10.2)
Chloride: 92 mmol/L — ABNORMAL LOW (ref 96–106)
Creatinine, Ser: 1.1 mg/dL (ref 0.76–1.27)
GFR calc Af Amer: 83 mL/min/{1.73_m2} (ref >59)
GFR calc non Af Amer: 72 mL/min/{1.73_m2} (ref >59)
Globulin, Total: 2.3 g/dL (ref 1.5–4.5)
Glucose: 233 mg/dL — ABNORMAL HIGH (ref 65–99)
Potassium: 5.6 mmol/L — ABNORMAL HIGH (ref 3.5–5.2)
Sodium: 127 mmol/L — ABNORMAL LOW (ref 134–144)
Total Protein: 6.7 g/dL (ref 6.0–8.5)

## 2019-11-15 MED ORDER — TRAMADOL HCL 50 MG PO TABS: 50.0000 mg | ORAL_TABLET | Freq: Four times a day (QID) | ORAL | 0 refills | Status: DC | PRN

## 2019-11-15 NOTE — Assessment & Plan Note (Signed)
PSA pending

## 2019-11-15 NOTE — Progress Notes (Signed)
Acute Office Visit  Subjective:    Patient ID: Gabriel Gomez, male    DOB: December 31, 1960, 59 y.o.   MRN: [id number]  Chief Complaint  Patient presents with  . Back Pain    HPI Patient is in today for low back/hip pain -pt was initially in for repeat labwork but wanted to discuss his low back pain - he had stated at last visit he would like neurosurgeon referral for chronic back and hip pain - we did get MRI in and reviewed and sent referral request to Dr Ronnald Ramp on 11/08/2019 - awaiting appointment - did state pain becoming unbearable - request pain medication  Pt due to recheck cmp - had elevated potassium at last draw - no symptoms ---pt also requested screening PSA with labwork (not having any current symptoms)  Past Medical History:  Diagnosis Date  . Coronary artery disease   . Diabetes mellitus without complication ([place])   . GERD (gastroesophageal reflux disease)     Past Surgical History:  Procedure Laterality Date  . LEFT HEART CATH AND CORONARY ANGIOGRAPHY N/A 05/30/2018   Procedure: LEFT HEART CATH AND CORONARY ANGIOGRAPHY;  Surgeon: Sherren Mocha, MD;  Location: [facility];  Service: Cardiovascular;  Laterality: N/A;    Family History  Problem Relation Age of Onset  . CAD Father     Social History   Socioeconomic History  . Marital status: Married    Spouse name: Not on file  . Number of children: Not on file  . Years of education: Not on file  . Highest education level: Not on file  Occupational History  . Not on file  Tobacco Use  . Smoking status: Current Every Day Smoker  . Smokeless tobacco: Never Used  Substance and Sexual Activity  . Alcohol use: Not on file  . Drug use: Not on file  . Sexual activity: Not on file  Other Topics Concern  . Not on file  Social History Narrative  . Not on file   Social Determinants of Health   Financial Resource Strain:   . Difficulty of Paying Living Expenses:   Food Insecurity:   . Worried About  Charity fundraiser in the Last Year:   . Arboriculturist in the Last Year:   Transportation Needs:   . Film/video editor (Medical):   Marland Kitchen Lack of Transportation (Non-Medical):   Physical Activity:   . Days of Exercise per Week:   . Minutes of Exercise per Session:   Stress:   . Feeling of Stress :   Social Connections:   . Frequency of Communication with Friends and Family:   . Frequency of Social Gatherings with Friends and Family:   . Attends Religious Services:   . Active Member of Clubs or Organizations:   . Attends Archivist Meetings:   Marland Kitchen Marital Status:   Intimate Partner Violence:   . Fear of Current or Ex-Partner:   . Emotionally Abused:   Marland Kitchen Physically Abused:   . Sexually Abused:      Current Outpatient Medications:  .  ACCU-CHEK GUIDE test strip, USE ONE strip FOUR TIMES DAILY, Disp: , Rfl:  .  acetaminophen (TYLENOL) 325 MG tablet, Take 650 mg by mouth 2 (two) times daily as needed for mild pain., Disp: , Rfl:  .  aspirin EC 81 MG EC tablet, Take 1 tablet (81 mg total) by mouth daily., Disp: , Rfl:  .  atorvastatin (LIPITOR) 80 MG tablet, TAKE  ONE TABLET BY MOUTH ONCE DAILY AT 6pm, Disp: 90 tablet, Rfl: 0 .  diclofenac (VOLTAREN) 75 MG EC tablet, Take 75 mg by mouth 2 (two) times daily., Disp: , Rfl:  .  gabapentin (NEURONTIN) 300 MG capsule, Take 300 mg by mouth 3 (three) times daily., Disp: , Rfl:  .  insulin aspart (NOVOLOG) 100 UNIT/ML injection, Inject 10 Units into the skin See admin instructions. Take 10 units every morning and based on sugar reading three other times daily take insulin based on sliding scale, Disp: , Rfl:  .  insulin glargine (LANTUS) 100 UNIT/ML injection, Inject 30 Units into the skin at bedtime., Disp: , Rfl:  .  LANTUS SOLOSTAR 100 UNIT/ML Solostar Pen, INJECT 25 units subcutaneously PER insulin protocol, Disp: 15 mL, Rfl: 2 .  metoprolol tartrate (LOPRESSOR) 25 MG tablet, Take 1 tablet (25 mg total) by mouth daily., Disp: 90  tablet, Rfl: 1 .  montelukast (SINGULAIR) 10 MG tablet, Take 1 tablet (10 mg total) by mouth at bedtime., Disp: 90 tablet, Rfl: 1 .  nitroGLYCERIN (NITROSTAT) 0.4 MG SL tablet, Place 1 tablet (0.4 mg total) under the tongue every 5 (five) minutes x 3 doses as needed for chest pain., Disp: 25 tablet, Rfl: 12 .  omeprazole (PRILOSEC) 20 MG capsule, TAKE ONE CAPSULE BY MOUTH ONCE DAILY AS NEEDED FOR reflux, Disp: 90 capsule, Rfl: 0 .  ramipril (ALTACE) 5 MG capsule, TAKE ONE CAPSULE BY MOUTH ONCE DAILY, Disp: 90 capsule, Rfl: 0 .  UNIFINE PENTIPS 32G X 4 MM MISC, AS DIRECTED FOUR TIMES DAILY, Disp: 100 each, Rfl: 2 .  traMADol (ULTRAM) 50 MG tablet, Take 1 tablet (50 mg total) by mouth every 6 (six) hours as needed for up to 5 days., Disp: 20 tablet, Rfl: 0   No Known Allergies  CONSTITUTIONAL: Negative for chills, fatigue, fever, unintentional weight gain and unintentional weight loss.  CARDIOVASCULAR: Negative for chest pain, dizziness, palpitations and pedal edema.  RESPIRATORY: Negative for recent cough and dyspnea.   MSK:see HPI         Objective:    PHYSICAL EXAM:   VS: BP 110/62 (BP Location: Left Arm, Patient Position: Sitting)   Pulse 76   Temp 97.9 F (36.6 C) (Temporal)   Ht 5\' 10"  (1.778 m)   Wt 201 lb (91.2 kg)   SpO2 97%   BMI 28.84 kg/m   GEN: Well nourished, well developed, in no acute distress  Cardiac: RRR; no murmurs, rubs, or gallops,no edema -  Respiratory:  normal respiratory rate and pattern with no distress - normal breath sounds with no rales, rhonchi, wheezes or rubs MS: walks slow - pain with movement into lower back and hip  Psych: euthymic mood, appropriate affect and demeanor   Wt Readings from Last 3 Encounters:  11/15/19 201 lb (91.2 kg)  11/06/19 197 lb (89.4 kg)  08/03/19 197 lb (89.4 kg)    Health Maintenance Due  Topic Date Due  . Hepatitis C Screening  Never done  . OPHTHALMOLOGY EXAM  Never done  . COLONOSCOPY  Never done     There are no preventive care reminders to display for this patient.        Assessment & Plan:   Problem List Items Addressed This Visit      Other   Acute left-sided low back pain with left-sided sciatica    rx for tramadol Referral has been made to neurosurgeon      Relevant Medications   traMADol (  ULTRAM) 50 MG tablet   Abnormal laboratory test    cmp pending      Prostate cancer screening - Primary    PSA pending      Relevant Orders   PSA       Meds ordered this encounter  Medications  . traMADol (ULTRAM) 50 MG tablet    Sig: Take 1 tablet (50 mg total) by mouth every 6 (six) hours as needed for up to 5 days.    Dispense:  20 tablet    Refill:  0    Order Specific Question:   Supervising Provider    Answer:   COX, KIRSTEN [[id number]]     [place], PA-C

## 2019-11-15 NOTE — Assessment & Plan Note (Signed)
cmp pending

## 2019-11-15 NOTE — Assessment & Plan Note (Signed)
rx for tramadol Referral has been made to neurosurgeon

## 2019-11-16 ENCOUNTER — Other Ambulatory Visit: Payer: Self-pay | Admitting: Physician Assistant

## 2019-11-16 DIAGNOSIS — R799 Abnormal finding of blood chemistry, unspecified: Secondary | ICD-10-CM

## 2019-11-16 LAB — PSA: Prostate Specific Ag, Serum: 0.2 ng/mL (ref 0.0–4.0)

## 2019-11-21 ENCOUNTER — Other Ambulatory Visit: Payer: Self-pay | Admitting: Physician Assistant

## 2019-11-21 DIAGNOSIS — M5442 Lumbago with sciatica, left side: Secondary | ICD-10-CM

## 2019-11-28 ENCOUNTER — Other Ambulatory Visit: Payer: Self-pay | Admitting: Physician Assistant

## 2019-11-28 DIAGNOSIS — M5442 Lumbago with sciatica, left side: Secondary | ICD-10-CM

## 2019-12-04 ENCOUNTER — Other Ambulatory Visit: Payer: Self-pay | Admitting: Physician Assistant

## 2019-12-04 DIAGNOSIS — M5442 Lumbago with sciatica, left side: Secondary | ICD-10-CM

## 2019-12-11 ENCOUNTER — Other Ambulatory Visit: Payer: Self-pay | Admitting: Physician Assistant

## 2019-12-11 DIAGNOSIS — M5442 Lumbago with sciatica, left side: Secondary | ICD-10-CM

## 2019-12-12 ENCOUNTER — Other Ambulatory Visit: Payer: Self-pay | Admitting: Physician Assistant

## 2019-12-12 DIAGNOSIS — M5442 Lumbago with sciatica, left side: Secondary | ICD-10-CM

## 2019-12-14 ENCOUNTER — Other Ambulatory Visit: Payer: Self-pay

## 2019-12-14 ENCOUNTER — Other Ambulatory Visit: Payer: Medicaid Other

## 2019-12-14 DIAGNOSIS — R799 Abnormal finding of blood chemistry, unspecified: Secondary | ICD-10-CM | POA: Diagnosis not present

## 2019-12-14 LAB — COMPREHENSIVE METABOLIC PANEL
ALT: 18 IU/L (ref 0–44)
AST: 19 IU/L (ref 0–40)
Albumin/Globulin Ratio: 1.9 (ref 1.2–2.2)
Albumin: 4.3 g/dL (ref 3.8–4.8)
Alkaline Phosphatase: 89 IU/L (ref 48–121)
BUN/Creatinine Ratio: 12 (ref 10–24)
BUN: 12 mg/dL (ref 8–27)
Bilirubin Total: 0.7 mg/dL (ref 0.0–1.2)
CO2: 21 mmol/L (ref 20–29)
Calcium: 9.2 mg/dL (ref 8.6–10.2)
Chloride: 95 mmol/L — ABNORMAL LOW (ref 96–106)
Creatinine, Ser: 1 mg/dL (ref 0.76–1.27)
GFR calc Af Amer: 93 mL/min/{1.73_m2} (ref >59)
GFR calc non Af Amer: 81 mL/min/{1.73_m2} (ref >59)
Globulin, Total: 2.3 g/dL (ref 1.5–4.5)
Glucose: 89 mg/dL (ref 65–99)
Potassium: 5.5 mmol/L — ABNORMAL HIGH (ref 3.5–5.2)
Sodium: 131 mmol/L — ABNORMAL LOW (ref 134–144)
Total Protein: 6.6 g/dL (ref 6.0–8.5)

## 2019-12-15 ENCOUNTER — Other Ambulatory Visit: Payer: Self-pay | Admitting: Physician Assistant

## 2019-12-15 DIAGNOSIS — M5442 Lumbago with sciatica, left side: Secondary | ICD-10-CM

## 2019-12-15 DIAGNOSIS — M5126 Other intervertebral disc displacement, lumbar region: Secondary | ICD-10-CM | POA: Diagnosis not present

## 2019-12-20 ENCOUNTER — Other Ambulatory Visit: Payer: Self-pay | Admitting: Physician Assistant

## 2019-12-20 DIAGNOSIS — M5442 Lumbago with sciatica, left side: Secondary | ICD-10-CM

## 2019-12-21 ENCOUNTER — Other Ambulatory Visit: Payer: Self-pay

## 2019-12-21 ENCOUNTER — Other Ambulatory Visit: Payer: Self-pay | Admitting: Legal Medicine

## 2019-12-21 DIAGNOSIS — M5442 Lumbago with sciatica, left side: Secondary | ICD-10-CM

## 2019-12-21 MED ORDER — TRAMADOL HCL 50 MG PO TABS: 50.0000 mg | ORAL_TABLET | Freq: Four times a day (QID) | ORAL | 0 refills | Status: DC | PRN

## 2019-12-27 ENCOUNTER — Other Ambulatory Visit: Payer: Self-pay | Admitting: Legal Medicine

## 2019-12-27 DIAGNOSIS — M5442 Lumbago with sciatica, left side: Secondary | ICD-10-CM

## 2019-12-28 ENCOUNTER — Other Ambulatory Visit: Payer: Self-pay | Admitting: Physician Assistant

## 2019-12-28 DIAGNOSIS — M5442 Lumbago with sciatica, left side: Secondary | ICD-10-CM

## 2019-12-28 NOTE — Telephone Encounter (Signed)
Please fill

## 2020-01-03 ENCOUNTER — Other Ambulatory Visit: Payer: Self-pay | Admitting: Family Medicine

## 2020-01-03 DIAGNOSIS — M5442 Lumbago with sciatica, left side: Secondary | ICD-10-CM

## 2020-01-04 ENCOUNTER — Other Ambulatory Visit: Payer: Self-pay | Admitting: Physician Assistant

## 2020-01-04 DIAGNOSIS — M5442 Lumbago with sciatica, left side: Secondary | ICD-10-CM

## 2020-01-04 MED ORDER — TRAMADOL HCL 50 MG PO TABS: 50.0000 mg | ORAL_TABLET | Freq: Four times a day (QID) | ORAL | 0 refills | Status: DC | PRN

## 2020-01-09 ENCOUNTER — Other Ambulatory Visit: Payer: Self-pay | Admitting: Physician Assistant

## 2020-01-09 DIAGNOSIS — M5442 Lumbago with sciatica, left side: Secondary | ICD-10-CM

## 2020-01-10 ENCOUNTER — Telehealth (INDEPENDENT_AMBULATORY_CARE_PROVIDER_SITE_OTHER): Payer: Medicaid Other | Admitting: Physician Assistant

## 2020-01-10 ENCOUNTER — Encounter: Payer: Self-pay | Admitting: Physician Assistant

## 2020-01-10 VITALS — BP 154/66 | HR 59 | Temp 96.4°F | Ht 70.0 in | Wt 201.0 lb

## 2020-01-10 DIAGNOSIS — J069 Acute upper respiratory infection, unspecified: Secondary | ICD-10-CM | POA: Diagnosis not present

## 2020-01-10 LAB — POC COVID19 BINAXNOW: SARS Coronavirus 2 Ag: NEGATIVE

## 2020-01-10 MED ORDER — BENZONATATE 200 MG PO CAPS: 200.0000 mg | ORAL_CAPSULE | Freq: Two times a day (BID) | ORAL | 0 refills | Status: DC | PRN

## 2020-01-10 MED ORDER — AZITHROMYCIN 250 MG PO TABS: ORAL_TABLET | ORAL | 0 refills | Status: DC

## 2020-01-10 NOTE — Progress Notes (Signed)
Virtual Visit via Telephone Note   This visit type was conducted due to national recommendations for restrictions regarding the COVID-19 Pandemic (e.g. social distancing) in an effort to limit this patient's exposure and mitigate transmission in our community.  Due to his co-morbid illnesses, this patient is at least at moderate risk for complications without adequate follow up.  This format is felt to be most appropriate for this patient at this time.  The patient did not have access to video technology/had technical difficulties with video requiring transitioning to audio format only (telephone).  All issues noted in this document were discussed and addressed.  No physical exam could be performed with this format.  Patient verbally consented to a telehealth visit.   Date:  01/10/2020   ID:  Gabriel Gomez, DOB [birth year], MRN [id number]  Patient Location: Home Provider Location: Office  PCP:  Marge Duncans, PA-C    Chief Complaint:  URI / malaise  History of Present Illness:    Gabriel Gomez is a 59 y.o. male with URI and malaise --- pt states that yesterday he began having a headache and runny nose - today  States he feels achey and not feeling well - does have a dry cough  He has no known exposure to COVID - still has smell and taste - no trouble breathing  The patient does have symptoms concerning for COVID-19 infection (fever, chills, cough, or new shortness of breath).    Past Medical History:  Diagnosis Date  . Coronary artery disease   . Diabetes mellitus without complication ([place])   . GERD (gastroesophageal reflux disease)    Past Surgical History:  Procedure Laterality Date  . LEFT HEART CATH AND CORONARY ANGIOGRAPHY N/A 05/30/2018   Procedure: LEFT HEART CATH AND CORONARY ANGIOGRAPHY;  Surgeon: Sherren Mocha, MD;  Location: [facility];  Service: Cardiovascular;  Laterality: N/A;     Current Meds  Medication Sig  . ACCU-CHEK GUIDE test strip USE ONE strip  FOUR TIMES DAILY  . acetaminophen (TYLENOL) 325 MG tablet Take 650 mg by mouth 2 (two) times daily as needed for mild pain.  Marland Kitchen aspirin EC 81 MG EC tablet Take 1 tablet (81 mg total) by mouth daily.  Marland Kitchen atorvastatin (LIPITOR) 80 MG tablet TAKE ONE TABLET BY MOUTH ONCE DAILY AT 6pm  . diclofenac (VOLTAREN) 75 MG EC tablet Take 75 mg by mouth 2 (two) times daily.  Marland Kitchen gabapentin (NEURONTIN) 300 MG capsule Take 300 mg by mouth 3 (three) times daily.  . insulin glargine (LANTUS) 100 UNIT/ML injection Inject 30 Units into the skin at bedtime.  . metoprolol tartrate (LOPRESSOR) 25 MG tablet Take 1 tablet (25 mg total) by mouth daily.  . montelukast (SINGULAIR) 10 MG tablet Take 1 tablet (10 mg total) by mouth at bedtime.  . nitroGLYCERIN (NITROSTAT) 0.4 MG SL tablet Place 1 tablet (0.4 mg total) under the tongue every 5 (five) minutes x 3 doses as needed for chest pain.  Marland Kitchen NOVOLOG FLEXPEN 100 UNIT/ML FlexPen inject 15 units EVERY MORNING, 10 units AT LUNCH AND 10 units EVERY EVENING  . omeprazole (PRILOSEC) 20 MG capsule TAKE ONE CAPSULE BY MOUTH ONCE DAILY AS NEEDED FOR reflux  . ramipril (ALTACE) 5 MG capsule TAKE ONE CAPSULE BY MOUTH ONCE DAILY  . traMADol (ULTRAM) 50 MG tablet TAKE ONE TABLET BY MOUTH EVERY 6 HOURS AS NEEDED FOR PAIN  . UNIFINE PENTIPS 32G X 4 MM MISC AS DIRECTED FOUR TIMES DAILY  . [DISCONTINUED] insulin  aspart (NOVOLOG) 100 UNIT/ML injection Inject 10 Units into the skin See admin instructions. Take 10 units every morning and based on sugar reading three other times daily take insulin based on sliding scale  . [DISCONTINUED] LANTUS SOLOSTAR 100 UNIT/ML Solostar Pen INJECT 25 units subcutaneously PER insulin protocol (Patient taking differently: 30 Units. )     Allergies:   Patient has no known allergies.   Social History   Tobacco Use  . Smoking status: Current Every Day Smoker    Packs/day: 1.00    Types: Cigarettes  . Smokeless tobacco: Never Used  Substance Use Topics  .  Alcohol use: Not Currently  . Drug use: Never     Family Hx: The patient's family history includes CAD in his father.  ROS:   Please see the history of present illness.    All other systems reviewed and are negative.  Labs/Other Tests and Data Reviewed:    Recent Labs: 08/03/2019: TSH 1.570 11/06/2019: Hemoglobin 14.5; Platelets 359 12/14/2019: ALT 18; BUN 12; Creatinine, Ser 1.00; Potassium 5.5; Sodium 131   Recent Lipid Panel Lab Results  Component Value Date/Time   CHOL 162 11/06/2019 08:48 AM   TRIG 169 (H) 11/06/2019 08:48 AM   HDL 45 11/06/2019 08:48 AM   CHOLHDL 3.6 11/06/2019 08:48 AM   CHOLHDL 4.9 05/29/2018 03:51 AM   LDLCALC 88 11/06/2019 08:48 AM    Wt Readings from Last 3 Encounters:  01/10/20 201 lb (91.2 kg)  11/15/19 201 lb (91.2 kg)  11/06/19 197 lb (89.4 kg)     Objective:    Vital Signs:  BP (!) 154/66   Pulse (!) 59   Temp (!) 96.4 F (35.8 C)   Ht 5\' 10"  (1.778 m)   Wt 201 lb (91.2 kg)   SpO2 96%   BMI 28.84 kg/m    VITAL SIGNS:  reviewed  ASSESSMENT & PLAN:    1. URI/sinusitis ---- had pt come in for rapid COVID test which was negative - did the send out PCR test as well since pt diabetic - sent rx for zpack and tessalon --- pt to quarantine until results back and recommend rest/fluids --- pt is advised if any symptoms change or worsen to notify   COVID-19 Education: The signs and symptoms of COVID-19 were discussed with the patient and how to seek care for testing (follow up with PCP or arrange E-visit). The importance of social distancing was discussed today.  Time:   Today, I have spent 10 minutes with the patient with telehealth technology discussing the above problems.     Medication Adjustments/Labs and Tests Ordered: Current medicines are reviewed at length with the patient today.  Concerns regarding medicines are outlined above.   Tests Ordered: Orders Placed This Encounter  Procedures  . Novel Coronavirus, NAA ([facility])  .  POC COVID-19 BinaxNow    Medication Changes: Meds ordered this encounter  Medications  . benzonatate (TESSALON) 200 MG capsule    Sig: Take 1 capsule (200 mg total) by mouth 2 (two) times daily as needed for cough.    Dispense:  30 capsule    Refill:  0    Order Specific Question:   Supervising Provider    AnswerRochel Brome [id number]  . azithromycin (ZITHROMAX) 250 MG tablet    Sig: 2 po day one then one po days 2-5    Dispense:  6 tablet    Refill:  0    Order Specific Question:   Supervising  Provider    AnswerRochel Brome [[id number]]    Follow Up:  In Person prn  Signed, Webb Silversmith, PA-C  01/10/2020 11:08 AM    [place]

## 2020-01-12 LAB — NOVEL CORONAVIRUS, NAA: SARS-CoV-2, NAA: NOT DETECTED

## 2020-01-16 ENCOUNTER — Other Ambulatory Visit: Payer: Self-pay | Admitting: Physician Assistant

## 2020-01-16 DIAGNOSIS — M5442 Lumbago with sciatica, left side: Secondary | ICD-10-CM

## 2020-01-22 ENCOUNTER — Other Ambulatory Visit: Payer: Self-pay | Admitting: Physician Assistant

## 2020-01-22 DIAGNOSIS — M5442 Lumbago with sciatica, left side: Secondary | ICD-10-CM

## 2020-01-29 ENCOUNTER — Other Ambulatory Visit: Payer: Self-pay | Admitting: Physician Assistant

## 2020-01-29 DIAGNOSIS — M5442 Lumbago with sciatica, left side: Secondary | ICD-10-CM

## 2020-01-29 MED ORDER — INSULIN GLARGINE 100 UNIT/ML ~~LOC~~ SOLN: 30.0000 [IU] | Freq: Every day | SUBCUTANEOUS | 2 refills | Status: DC

## 2020-02-02 ENCOUNTER — Other Ambulatory Visit: Payer: Self-pay | Admitting: Physician Assistant

## 2020-02-02 DIAGNOSIS — M5442 Lumbago with sciatica, left side: Secondary | ICD-10-CM

## 2020-02-07 ENCOUNTER — Other Ambulatory Visit: Payer: Self-pay | Admitting: Physician Assistant

## 2020-02-07 DIAGNOSIS — M5442 Lumbago with sciatica, left side: Secondary | ICD-10-CM

## 2020-02-09 ENCOUNTER — Ambulatory Visit: Payer: Medicaid Other | Admitting: Physician Assistant

## 2020-02-12 ENCOUNTER — Other Ambulatory Visit: Payer: Self-pay | Admitting: Physician Assistant

## 2020-02-12 DIAGNOSIS — M5442 Lumbago with sciatica, left side: Secondary | ICD-10-CM

## 2020-02-19 ENCOUNTER — Other Ambulatory Visit: Payer: Self-pay | Admitting: Physician Assistant

## 2020-02-19 DIAGNOSIS — M5442 Lumbago with sciatica, left side: Secondary | ICD-10-CM

## 2020-02-21 ENCOUNTER — Ambulatory Visit: Payer: Medicaid Other | Admitting: Nurse Practitioner

## 2020-02-21 ENCOUNTER — Ambulatory Visit: Payer: Medicaid Other | Admitting: Physician Assistant

## 2020-02-26 ENCOUNTER — Other Ambulatory Visit: Payer: Self-pay | Admitting: Physician Assistant

## 2020-02-26 DIAGNOSIS — M5442 Lumbago with sciatica, left side: Secondary | ICD-10-CM

## 2020-02-27 ENCOUNTER — Other Ambulatory Visit: Payer: Self-pay

## 2020-02-27 DIAGNOSIS — M5442 Lumbago with sciatica, left side: Secondary | ICD-10-CM

## 2020-02-27 MED ORDER — TRAMADOL HCL 50 MG PO TABS: 50.0000 mg | ORAL_TABLET | Freq: Four times a day (QID) | ORAL | 0 refills | Status: DC | PRN

## 2020-02-27 NOTE — Telephone Encounter (Signed)
Dr. Tobie Poet please advise.  Pt asking for refill for Tramadol sent to [facility].

## 2020-02-27 NOTE — Telephone Encounter (Signed)
Pt needs to call office. Pt was supposed to see Dr. Ronnald Ramp. No more pain medicine after this rx.

## 2020-02-28 NOTE — Telephone Encounter (Signed)
Pt is aware and has appointment to see [facility] 10/28.

## 2020-03-07 ENCOUNTER — Encounter: Payer: Self-pay | Admitting: Nurse Practitioner

## 2020-03-07 ENCOUNTER — Ambulatory Visit: Payer: Medicaid Other | Admitting: Nurse Practitioner

## 2020-03-07 ENCOUNTER — Other Ambulatory Visit: Payer: Self-pay

## 2020-03-07 VITALS — BP 138/78 | HR 68 | Temp 97.9°F | Ht 70.0 in | Wt 198.0 lb

## 2020-03-07 DIAGNOSIS — E119 Type 2 diabetes mellitus without complications: Secondary | ICD-10-CM

## 2020-03-07 DIAGNOSIS — M5440 Lumbago with sciatica, unspecified side: Secondary | ICD-10-CM | POA: Diagnosis not present

## 2020-03-07 DIAGNOSIS — Z794 Long term (current) use of insulin: Secondary | ICD-10-CM

## 2020-03-07 DIAGNOSIS — I10 Essential (primary) hypertension: Secondary | ICD-10-CM | POA: Diagnosis not present

## 2020-03-07 DIAGNOSIS — M5136 Other intervertebral disc degeneration, lumbar region: Secondary | ICD-10-CM

## 2020-03-07 DIAGNOSIS — M51369 Other intervertebral disc degeneration, lumbar region without mention of lumbar back pain or lower extremity pain: Secondary | ICD-10-CM

## 2020-03-07 DIAGNOSIS — M5416 Radiculopathy, lumbar region: Secondary | ICD-10-CM | POA: Insufficient documentation

## 2020-03-07 DIAGNOSIS — M48061 Spinal stenosis, lumbar region without neurogenic claudication: Secondary | ICD-10-CM | POA: Diagnosis not present

## 2020-03-07 DIAGNOSIS — E1169 Type 2 diabetes mellitus with other specified complication: Secondary | ICD-10-CM | POA: Diagnosis not present

## 2020-03-07 DIAGNOSIS — M5126 Other intervertebral disc displacement, lumbar region: Secondary | ICD-10-CM

## 2020-03-07 DIAGNOSIS — E785 Hyperlipidemia, unspecified: Secondary | ICD-10-CM | POA: Diagnosis not present

## 2020-03-07 HISTORY — DX: Radiculopathy, lumbar region: M54.16

## 2020-03-07 HISTORY — DX: Other intervertebral disc degeneration, lumbar region without mention of lumbar back pain or lower extremity pain: M51.369

## 2020-03-07 MED ORDER — TRAMADOL HCL 50 MG PO TABS: 50.0000 mg | ORAL_TABLET | Freq: Four times a day (QID) | ORAL | 0 refills | Status: DC | PRN

## 2020-03-07 NOTE — Progress Notes (Signed)
Subjective:  Patient ID: Gabriel Gomez, male    DOB: 09/14/1960  Age: 59 y.o. MRN: [id number]  Chief Complaint  Patient presents with  . Diabetes    3 Months Fasting    HPI  Gabriel Gomez is a 59 year old Caucasian male present for follow-up for insulin dependent diabetes, hypertension, and hyperlipidemia. He is up-to-date with colonoscopy screening, has received flu and COVID-19 vaccination this year. He is due for diabetic eye exam.   Type 2 Diabetes Mellitus with long-term insulin use Patient has been a diabetic for 26 years. He is moderately-controlled with Novolog insulin (15U Q am, 10U Q lunch, and Q dinner) and Lantus insulin (30 U QHS). He performs regular blood glucose checks daily, states they average between 100-175. States he does not eat concentrated sweets unless he feels hypoglycemic.States hypoglycemia episodes are rare. Last Hgb A1C on 11/06/19 was 7.7.   Hypertension Patient has a history of moderately-controlled hypertension. BP 138/78. Current treatment includes Ramipril 5mg  and Metoprolol 25 mg daily. Denies headache, dyspnea, chest pain or taking NTG. He states he has not experienced any hypotensive episodes, dizziness, or falls.  Hyperlipidemia Patient has a history of hyperlipidemia for more than 1-year. Current treatment includes Lipitor 80 mg daily. He is moderately controlled. Lipid panel on 11/06/19 revealed TC 162, Trig 169, HDL 45, LDL 88. He tries to consume a heart healthy diet and stay active with tasks around his house. He is not currently exercising due  To chronic back and left hip pain.   Chronic Pain Patient has chronic pain to left hip and lower back. He has a history of left total hip replacement in [place] in approximately 3 years ago. He states he has continued to have pain to left hip.Unable to rate it on 0/10 scale. States he is unable to follow-up with orthopedic surgeon because he retired. He has seen Dr Donivan Scull for lower back pain. MRI on 09/09/19  shows L5-S1 mild disc bulging and subarticular stenosis. He states Dr Donivan Scull offered epidural steroid injection but he refused due to hyperglycemia risk. He also refused referral to pain clinic. States "They messed me up before,22 using me like a [place] pig." His pain is moderately-controlled with Tramadol 50 mg Q6 hours and Gabapentin 300mg  TID.      Current Outpatient Medications on File Prior to Visit  Medication Sig Dispense Refill  . ACCU-CHEK GUIDE test strip USE ONE strip FOUR TIMES DAILY 550 strip 3  . acetaminophen (TYLENOL) 325 MG tablet Take 650 mg by mouth 2 (two) times daily as needed for mild pain.    Marland Kitchen atorvastatin (LIPITOR) 80 MG tablet TAKE ONE TABLET BY MOUTH ONCE DAILY AT 6pm 90 tablet 0  . benzonatate (TESSALON) 200 MG capsule Take 1 capsule (200 mg total) by mouth 2 (two) times daily as needed for cough. 30 capsule 0  . diclofenac (VOLTAREN) 75 MG EC tablet Take 75 mg by mouth 2 (two) times daily.    Marland Kitchen gabapentin (NEURONTIN) 300 MG capsule Take 300 mg by mouth 3 (three) times daily.    . insulin glargine (LANTUS) 100 UNIT/ML injection Inject 0.3 mLs (30 Units total) into the skin at bedtime. 10 mL 2  . metoprolol tartrate (LOPRESSOR) 25 MG tablet TAKE ONE TABLET BY MOUTH ONCE DAILY 90 tablet 0  . montelukast (SINGULAIR) 10 MG tablet TAKE ONE TABLET BY MOUTH AT BEDTIME 90 tablet 0  . nitroGLYCERIN (NITROSTAT) 0.4 MG SL tablet Place 1 tablet (0.4 mg total) under the tongue every 5 (  five) minutes x 3 doses as needed for chest pain. 25 tablet 12  . NOVOLOG FLEXPEN 100 UNIT/ML FlexPen inject 15 units EVERY MORNING, 10 units AT LUNCH AND 10 units EVERY EVENING 15 mL 2  . omeprazole (PRILOSEC) 20 MG capsule TAKE ONE CAPSULE BY MOUTH ONCE DAILY AS NEEDED FOR reflux 90 capsule 0  . ramipril (ALTACE) 5 MG capsule TAKE ONE CAPSULE BY MOUTH ONCE DAILY 90 capsule 0  . UNIFINE PENTIPS 32G X 4 MM MISC AS DIRECTED FOUR TIMES DAILY 100 each 2   No current facility-administered medications on  file prior to visit.   Past Medical History:  Diagnosis Date  . Coronary artery disease   . Diabetes mellitus without complication ([place])   . GERD (gastroesophageal reflux disease)    Past Surgical History:  Procedure Laterality Date  . LEFT HEART CATH AND CORONARY ANGIOGRAPHY N/A 05/30/2018   Procedure: LEFT HEART CATH AND CORONARY ANGIOGRAPHY;  Surgeon: Sherren Mocha, MD;  Location: [facility];  Service: Cardiovascular;  Laterality: N/A;    Family History  Problem Relation Age of Onset  . CAD Father    Social History   Socioeconomic History  . Marital status: Married    Spouse name: Not on file  . Number of children: Not on file  . Years of education: Not on file  . Highest education level: Not on file  Occupational History  . Not on file  Tobacco Use  . Smoking status: Current Every Day Smoker    Packs/day: 1.00    Types: Cigarettes  . Smokeless tobacco: Never Used  Substance and Sexual Activity  . Alcohol use: Not Currently  . Drug use: Never  . Sexual activity: Yes    Partners: Female  Other Topics Concern  . Not on file  Social History Narrative  . Not on file   Social Determinants of Health   Financial Resource Strain:   . Difficulty of Paying Living Expenses: Not on file  Food Insecurity:   . Worried About Charity fundraiser in the Last Year: Not on file  . Ran Out of Food in the Last Year: Not on file  Transportation Needs:   . Lack of Transportation (Medical): Not on file  . Lack of Transportation (Non-Medical): Not on file  Physical Activity:   . Days of Exercise per Week: Not on file  . Minutes of Exercise per Session: Not on file  Stress:   . Feeling of Stress : Not on file  Social Connections:   . Frequency of Communication with Friends and Family: Not on file  . Frequency of Social Gatherings with Friends and Family: Not on file  . Attends Religious Services: Not on file  . Active Member of Clubs or Organizations: Not on file  .  Attends Archivist Meetings: Not on file  . Marital Status: Not on file    Review of Systems  Constitutional: Negative for fatigue and fever.  HENT: Negative for congestion, ear pain, sinus pressure and sore throat.   Eyes: Negative for pain.  Respiratory: Negative for cough, chest tightness, shortness of breath and wheezing.   Cardiovascular: Negative for chest pain and palpitations.  Gastrointestinal: Negative for abdominal pain, constipation, diarrhea, nausea and vomiting.  Genitourinary: Negative for dysuria and hematuria.  Musculoskeletal: Negative for arthralgias, back pain, joint swelling and myalgias.  Skin: Negative for rash.  Neurological: Negative for dizziness, weakness and headaches.  Psychiatric/Behavioral: Negative for dysphoric mood. The patient is not  nervous/anxious.      Objective:  BP 138/78 (BP Location: Right Arm, Patient Position: Sitting)   Pulse 68   Temp 97.9 F (36.6 C) (Temporal)   Ht 5\' 10"  (1.778 m)   Wt 198 lb (89.8 kg)   SpO2 97%   BMI 28.41 kg/m   BP/Weight 03/07/2020 05/16/1094 0/08/5407  Systolic BP 811 914 782  Diastolic BP 78 66 62  Wt. (Lbs) 198 201 201  BMI 28.41 28.84 28.84    Physical Exam Constitutional:      Appearance: Normal appearance.  HENT:     Right Ear: Tympanic membrane and external ear normal.     Left Ear: Tympanic membrane and external ear normal.     Nose: Nose normal.     Mouth/Throat:     Mouth: Mucous membranes are moist.  Eyes:     Pupils: Pupils are equal, round, and reactive to light.  Cardiovascular:     Rate and Rhythm: Normal rate and regular rhythm.     Pulses: Normal pulses.     Heart sounds: Normal heart sounds.  Pulmonary:     Breath sounds: Normal breath sounds.  Abdominal:     General: Abdomen is flat. Bowel sounds are normal.     Palpations: Abdomen is soft.  Musculoskeletal:        General: Normal range of motion.     Cervical back: Normal range of motion.  Skin:    General:  Skin is warm and dry.  Neurological:     Mental Status: He is alert and oriented to person, place, and time.  Psychiatric:        Mood and Affect: Mood normal.        Behavior: Behavior normal.              Lab Results  Component Value Date   WBC 10.5 11/06/2019   HGB 14.5 11/06/2019   HCT 42.6 11/06/2019   PLT 359 11/06/2019   GLUCOSE 89 12/14/2019   CHOL 162 11/06/2019   TRIG 169 (H) 11/06/2019   HDL 45 11/06/2019   LDLCALC 88 11/06/2019   ALT 18 12/14/2019   AST 19 12/14/2019   NA 131 (L) 12/14/2019   K 5.5 (H) 12/14/2019   CL 95 (L) 12/14/2019   CREATININE 1.00 12/14/2019   BUN 12 12/14/2019   CO2 21 12/14/2019   TSH 1.570 08/03/2019   INR 1.01 05/28/2018   HGBA1C 7.7 (H) 11/06/2019   MICROALBUR neg 08/03/2019      Assessment & Plan:   1. Hyperlipidemia associated with type 2 diabetes mellitus ([place]) - Hemoglobin A1c - CBC with Differential/Platelet - Comprehensive metabolic panel - Lipid panel - [facility] Diabetes Eye Exam  2. Essential hypertension, benign - CBC with Differential/Platelet - HM Diabetes Eye Exam  3. Controlled type 2 diabetes mellitus with insulin therapy ([place]) - Hemoglobin A1c - CBC with Differential/Platelet - Comprehensive metabolic panel - Lipid panel - [facility] Diabetes Eye Exam  4. Low back pain with sciatica, sciatica laterality unspecified, unspecified back pain laterality, unspecified chronicity - traMADol (ULTRAM) 50 MG tablet; Take 1 tablet (50 mg total) by mouth every 6 (six) hours as needed. for pain. Pt needs to call office.  Dispense: 120 tablet; Refill: 0  5. Bulging lumbar disc - traMADol (ULTRAM) 50 MG tablet; Take 1 tablet (50 mg total) by mouth every 6 (six) hours as needed. for pain. Pt needs to call office.  Dispense: 120 tablet; Refill: 0  6. Spinal stenosis  of lumbar region, unspecified whether neurogenic claudication present - traMADol (ULTRAM) 50 MG tablet; Take 1 tablet (50 mg total) by mouth every 6 (six) hours as  needed. for pain. Pt needs to call office.  Dispense: 120 tablet; Refill: 0 -Will discuss increasing Gabapentin to 600 mg BID for 1-week then TID to decrease Tramadol intake and improve pain control.     Meds ordered this encounter  Medications  . traMADol (ULTRAM) 50 MG tablet    Sig: Take 1 tablet (50 mg total) by mouth every 6 (six) hours as needed. for pain. Pt needs to call office.    Dispense:  120 tablet    Refill:  0    Order Specific Question:   Supervising Provider    AnswerShelton Silvas    Orders Placed This Encounter  Procedures  . Hemoglobin A1c  . CBC with Differential/Platelet  . Comprehensive metabolic panel  . Lipid panel  . [facility] Diabetes Eye Exam     Follow-up: Return in about 3 months (around 06/07/2020) for dm.  An After Visit Summary was printed and given to the patient.  Jerrell Belfast, DNP [facility] [phone]

## 2020-03-07 NOTE — Patient Instructions (Signed)
Return in 3 months for follow-up Consider quitting smoking Consume a heart healthy, low-carb diet  Managing Your Hypertension Hypertension is commonly called high blood pressure. This is when the force of your blood pressing against the walls of your arteries is too strong. Arteries are blood vessels that carry blood from your heart throughout your body. Hypertension forces the heart to work harder to pump blood, and may cause the arteries to become narrow or stiff. Having untreated or uncontrolled hypertension can cause heart attack, stroke, kidney disease, and other problems. What are blood pressure readings? A blood pressure reading consists of a higher number over a lower number. Ideally, your blood pressure should be below 120/80. The first ("top") number is called the systolic pressure. It is a measure of the pressure in your arteries as your heart beats. The second ("bottom") number is called the diastolic pressure. It is a measure of the pressure in your arteries as the heart relaxes. What does my blood pressure reading mean? Blood pressure is classified into four stages. Based on your blood pressure reading, your health care provider may use the following stages to determine what type of treatment you need, if any. Systolic pressure and diastolic pressure are measured in a unit called mm Hg. Normal  Systolic pressure: below 761.  Diastolic pressure: below 80. Elevated  Systolic pressure: 607-371.  Diastolic pressure: below 80. Hypertension stage 1  Systolic pressure: 062-694.  Diastolic pressure: 85-46. Hypertension stage 2  Systolic pressure: 270 or above.  Diastolic pressure: 90 or above. What health risks are associated with hypertension? Managing your hypertension is an important responsibility. Uncontrolled hypertension can lead to:  A heart attack.  A stroke.  A weakened blood vessel (aneurysm).  Heart failure.  Kidney damage.  Eye damage.  Metabolic  syndrome.  Memory and concentration problems. What changes can I make to manage my hypertension? Hypertension can be managed by making lifestyle changes and possibly by taking medicines. Your health care provider will help you make a plan to bring your blood pressure within a normal range. Eating and drinking   Eat a diet that is high in fiber and potassium, and low in salt (sodium), added sugar, and fat. An example eating plan is called the DASH (Dietary Approaches to Stop Hypertension) diet. To eat this way: ? Eat plenty of fresh fruits and vegetables. Try to fill half of your plate at each meal with fruits and vegetables. ? Eat whole grains, such as whole wheat pasta, brown rice, or whole grain bread. Fill about one quarter of your plate with whole grains. ? Eat low-fat diary products. ? Avoid fatty cuts of meat, processed or cured meats, and poultry with skin. Fill about one quarter of your plate with lean proteins such as fish, chicken without skin, beans, eggs, and tofu. ? Avoid premade and processed foods. These tend to be higher in sodium, added sugar, and fat.  Reduce your daily sodium intake. Most people with hypertension should eat less than 1,500 mg of sodium a day.  Limit alcohol intake to no more than 1 drink a day for nonpregnant women and 2 drinks a day for men. One drink equals 12 oz of beer, 5 oz of wine, or 1 oz of hard liquor. Lifestyle  Work with your health care provider to maintain a healthy body weight, or to lose weight. Ask what an ideal weight is for you.  Get at least 30 minutes of exercise that causes your heart to beat faster (aerobic  exercise) most days of the week. Activities may include walking, swimming, or biking.  Include exercise to strengthen your muscles (resistance exercise), such as weight lifting, as part of your weekly exercise routine. Try to do these types of exercises for 30 minutes at least 3 days a week.  Do not use any products that contain  nicotine or tobacco, such as cigarettes and e-cigarettes. If you need help quitting, ask your health care provider.  Control any long-term (chronic) conditions you have, such as high cholesterol or diabetes. Monitoring  Monitor your blood pressure at home as told by your health care provider. Your personal target blood pressure may vary depending on your medical conditions, your age, and other factors.  Have your blood pressure checked regularly, as often as told by your health care provider. Working with your health care provider  Review all the medicines you take with your health care provider because there may be side effects or interactions.  Talk with your health care provider about your diet, exercise habits, and other lifestyle factors that may be contributing to hypertension.  Visit your health care provider regularly. Your health care provider can help you create and adjust your plan for managing hypertension. Will I need medicine to control my blood pressure? Your health care provider may prescribe medicine if lifestyle changes are not enough to get your blood pressure under control, and if:  Your systolic blood pressure is 130 or higher.  Your diastolic blood pressure is 80 or higher. Take medicines only as told by your health care provider. Follow the directions carefully. Blood pressure medicines must be taken as prescribed. The medicine does not work as well when you skip doses. Skipping doses also puts you at risk for problems. Contact a health care provider if:  You think you are having a reaction to medicines you have taken.  You have repeated (recurrent) headaches.  You feel dizzy.  You have swelling in your ankles.  You have trouble with your vision. Get help right away if:  You develop a severe headache or confusion.  You have unusual weakness or numbness, or you feel faint.  You have severe pain in your chest or abdomen.  You vomit repeatedly.  You have  trouble breathing. Summary  Hypertension is when the force of blood pumping through your arteries is too strong. If this condition is not controlled, it may put you at risk for serious complications.  Your personal target blood pressure may vary depending on your medical conditions, your age, and other factors. For most people, a normal blood pressure is less than 120/80.  Hypertension is managed by lifestyle changes, medicines, or both. Lifestyle changes include weight loss, eating a healthy, low-sodium diet, exercising more, and limiting alcohol. This information is not intended to replace advice given to you by your health care provider. Make sure you discuss any questions you have with your health care provider. Document Revised: 08/19/2018 Document Reviewed: 03/25/2016 Elsevier Patient Education  2020 [employer]. Steps to Quit Smoking Smoking tobacco is the leading cause of preventable death. It can affect almost every organ in the body. Smoking puts you and people around you at risk for many serious, long-lasting (chronic) diseases. Quitting smoking can be hard, but it is one of the best things that you can do for your health. It is never too late to quit. How do I get ready to quit? When you decide to quit smoking, make a plan to help you succeed. Before you quit:  Pick a date to quit. Set a date within the next 2 weeks to give you time to prepare.  Write down the reasons why you are quitting. Keep this list in places where you will see it often.  Tell your family, friends, and co-workers that you are quitting. Their support is important.  Talk with your doctor about the choices that may help you quit.  Find out if your health insurance will pay for these treatments.  Know the people, places, things, and activities that make you want to smoke (triggers). Avoid them. What first steps can I take to quit smoking?  Throw away all cigarettes at home, at work, and in your  car.  Throw away the things that you use when you smoke, such as ashtrays and lighters.  Clean your car. Make sure to empty the ashtray.  Clean your home, including curtains and carpets. What can I do to help me quit smoking? Talk with your doctor about taking medicines and seeing a counselor at the same time. You are more likely to succeed when you do both.  If you are pregnant or breastfeeding, talk with your doctor about counseling or other ways to quit smoking. Do not take medicine to help you quit smoking unless your doctor tells you to do so. To quit smoking: Quit right away  Quit smoking totally, instead of slowly cutting back on how much you smoke over a period of time.  Go to counseling. You are more likely to quit if you go to counseling sessions regularly. Take medicine You may take medicines to help you quit. Some medicines need a prescription, and some you can buy over-the-counter. Some medicines may contain a drug called nicotine to replace the nicotine in cigarettes. Medicines may:  Help you to stop having the desire to smoke (cravings).  Help to stop the problems that come when you stop smoking (withdrawal symptoms). Your doctor may ask you to use:  Nicotine patches, gum, or lozenges.  Nicotine inhalers or sprays.  Non-nicotine medicine that is taken by mouth. Find resources Find resources and other ways to help you quit smoking and remain smoke-free after you quit. These resources are most helpful when you use them often. They include:  Online chats with a Social worker.  Phone quitlines.  Printed Furniture conservator/restorer.  Support groups or group counseling.  Text messaging programs.  Mobile phone apps. Use apps on your mobile phone or tablet that can help you stick to your quit plan. There are many free apps for mobile phones and tablets as well as websites. Examples include Quit Guide from the [employer] and smokefree.gov  What things can I do to make it easier to  quit?   Talk to your family and friends. Ask them to support and encourage you.  Call a phone quitline (1-800-QUIT-NOW), reach out to support groups, or work with a Social worker.  Ask people who smoke to not smoke around you.  Avoid places that make you want to smoke, such as: ? Bars. ? Parties. ? Smoke-break areas at work.  Spend time with people who do not smoke.  Lower the stress in your life. Stress can make you want to smoke. Try these things to help your stress: ? Getting regular exercise. ? Doing deep-breathing exercises. ? Doing yoga. ? Meditating. ? Doing a body scan. To do this, close your eyes, focus on one area of your body at a time from head to toe. Notice which parts of your body are tense.  Try to relax the muscles in those areas. How will I feel when I quit smoking? Day 1 to 3 weeks Within the first 24 hours, you may start to have some problems that come from quitting tobacco. These problems are very bad 2-3 days after you quit, but they do not often last for more than 2-3 weeks. You may get these symptoms:  Mood swings.  Feeling restless, nervous, angry, or annoyed.  Trouble concentrating.  Dizziness.  Strong desire for high-sugar foods and nicotine.  Weight gain.  Trouble pooping (constipation).  Feeling like you may vomit (nausea).  Coughing or a sore throat.  Changes in how the medicines that you take for other issues work in your body.  Depression.  Trouble sleeping (insomnia). Week 3 and afterward After the first 2-3 weeks of quitting, you may start to notice more positive results, such as:  Better sense of smell and taste.  Less coughing and sore throat.  Slower heart rate.  Lower blood pressure.  Clearer skin.  Better breathing.  Fewer sick days. Quitting smoking can be hard. Do not give up if you fail the first time. Some people need to try a few times before they succeed. Do your best to stick to your quit plan, and talk with  your doctor if you have any questions or concerns. Summary  Smoking tobacco is the leading cause of preventable death. Quitting smoking can be hard, but it is one of the best things that you can do for your health.  When you decide to quit smoking, make a plan to help you succeed.  Quit smoking right away, not slowly over a period of time.  When you start quitting, seek help from your doctor, family, or friends. This information is not intended to replace advice given to you by your health care provider. Make sure you discuss any questions you have with your health care provider. Document Revised: 01/20/2019 Document Reviewed: 07/16/2018 Elsevier Patient Education  [place]. Diabetes Mellitus and Exercise Exercising regularly is important for your overall health, especially when you have diabetes (diabetes mellitus). Exercising is not only about losing weight. It has many other health benefits, such as increasing muscle strength and bone density and reducing body fat and stress. This leads to improved fitness, flexibility, and endurance, all of which result in better overall health. Exercise has additional benefits for people with diabetes, including:  Reducing appetite.  Helping to lower and control blood glucose.  Lowering blood pressure.  Helping to control amounts of fatty substances (lipids) in the blood, such as cholesterol and triglycerides.  Helping the body to respond better to insulin (improving insulin sensitivity).  Reducing how much insulin the body needs.  Decreasing the risk for heart disease by: ? Lowering cholesterol and triglyceride levels. ? Increasing the levels of good cholesterol. ? Lowering blood glucose levels. What is my activity plan? Your health care provider or certified diabetes educator can help you make a plan for the type and frequency of exercise (activity plan) that works for you. Make sure that you:  Do at least 150 minutes of  moderate-intensity or vigorous-intensity exercise each week. This could be brisk walking, biking, or water aerobics. ? Do stretching and strength exercises, such as yoga or weightlifting, at least 2 times a week. ? Spread out your activity over at least 3 days of the week.  Get some form of physical activity every day. ? Do not go more than 2 days in a row without  some kind of physical activity. ? Avoid being inactive for more than 30 minutes at a time. Take frequent breaks to walk or stretch.  Choose a type of exercise or activity that you enjoy, and set realistic goals.  Start slowly, and gradually increase the intensity of your exercise over time. What do I need to know about managing my diabetes?   Check your blood glucose before and after exercising. ? If your blood glucose is 240 mg/dL (13.3 mmol/L) or higher before you exercise, check your urine for ketones. If you have ketones in your urine, do not exercise until your blood glucose returns to normal. ? If your blood glucose is 100 mg/dL (5.6 mmol/L) or lower, eat a snack containing 15-20 grams of carbohydrate. Check your blood glucose 15 minutes after the snack to make sure that your level is above 100 mg/dL (5.6 mmol/L) before you start your exercise.  Know the symptoms of low blood glucose (hypoglycemia) and how to treat it. Your risk for hypoglycemia increases during and after exercise. Common symptoms of hypoglycemia can include: ? Hunger. ? Anxiety. ? Sweating and feeling clammy. ? Confusion. ? Dizziness or feeling light-headed. ? Increased heart rate or palpitations. ? Blurry vision. ? Tingling or numbness around the mouth, lips, or tongue. ? Tremors or shakes. ? Irritability.  Keep a rapid-acting carbohydrate snack available before, during, and after exercise to help prevent or treat hypoglycemia.  Avoid injecting insulin into areas of the body that are going to be exercised. For example, avoid injecting insulin  into: ? The arms, when playing tennis. ? The legs, when jogging.  Keep records of your exercise habits. Doing this can help you and your health care provider adjust your diabetes management plan as needed. Write down: ? Food that you eat before and after you exercise. ? Blood glucose levels before and after you exercise. ? The type and amount of exercise you have done. ? When your insulin is expected to peak, if you use insulin. Avoid exercising at times when your insulin is peaking.  When you start a new exercise or activity, work with your health care provider to make sure the activity is safe for you, and to adjust your insulin, medicines, or food intake as needed.  Drink plenty of water while you exercise to prevent dehydration or heat stroke. Drink enough fluid to keep your urine clear or pale yellow. Summary  Exercising regularly is important for your overall health, especially when you have diabetes (diabetes mellitus).  Exercising has many health benefits, such as increasing muscle strength and bone density and reducing body fat and stress.  Your health care provider or certified diabetes educator can help you make a plan for the type and frequency of exercise (activity plan) that works for you.  When you start a new exercise or activity, work with your health care provider to make sure the activity is safe for you, and to adjust your insulin, medicines, or food intake as needed. This information is not intended to replace advice given to you by your health care provider. Make sure you discuss any questions you have with your health care provider. Document Revised: 11/19/2016 Document Reviewed: 10/07/2015 Elsevier Patient Education  2020 [employer] Inc. Preventive Care 97-81 Years Old, Male Preventive care refers to lifestyle choices and visits with your health care provider that can promote health and wellness. This includes:  A yearly physical exam. This is also called an annual  well check.  Regular dental and eye  exams.  Immunizations.  Screening for certain conditions.  Healthy lifestyle choices, such as eating a healthy diet, getting regular exercise, not using drugs or products that contain nicotine and tobacco, and limiting alcohol use. What can I expect for my preventive care visit? Physical exam Your health care provider will check:  Height and weight. These may be used to calculate body mass index (BMI), which is a measurement that tells if you are at a healthy weight.  Heart rate and blood pressure.  Your skin for abnormal spots. Counseling Your health care provider may ask you questions about:  Alcohol, tobacco, and drug use.  Emotional well-being.  Home and relationship well-being.  Sexual activity.  Eating habits.  Work and work Statistician. What immunizations do I need?  Influenza (flu) vaccine  This is recommended every year. Tetanus, diphtheria, and pertussis (Tdap) vaccine  You may need a Td booster every 10 years. Varicella (chickenpox) vaccine  You may need this vaccine if you have not already been vaccinated. Zoster (shingles) vaccine  You may need this after age 38. Measles, mumps, and rubella (MMR) vaccine  You may need at least one dose of MMR if you were born in 1957 or later. You may also need a second dose. Pneumococcal conjugate (PCV13) vaccine  You may need this if you have certain conditions and were not previously vaccinated. Pneumococcal polysaccharide (PPSV23) vaccine  You may need one or two doses if you smoke cigarettes or if you have certain conditions. Meningococcal conjugate (MenACWY) vaccine  You may need this if you have certain conditions. Hepatitis A vaccine  You may need this if you have certain conditions or if you travel or work in places where you may be exposed to hepatitis A. Hepatitis B vaccine  You may need this if you have certain conditions or if you travel or work in places  where you may be exposed to hepatitis B. Haemophilus influenzae type b (Hib) vaccine  You may need this if you have certain risk factors. Human papillomavirus (HPV) vaccine  If recommended by your health care provider, you may need three doses over 6 months. You may receive vaccines as individual doses or as more than one vaccine together in one shot (combination vaccines). Talk with your health care provider about the risks and benefits of combination vaccines. What tests do I need? Blood tests  Lipid and cholesterol levels. These may be checked every 5 years, or more frequently if you are over 26 years old.  Hepatitis C test.  Hepatitis B test. Screening  Lung cancer screening. You may have this screening every year starting at age 1 if you have a 30-pack-year history of smoking and currently smoke or have quit within the past 15 years.  Prostate cancer screening. Recommendations will vary depending on your family history and other risks.  Colorectal cancer screening. All adults should have this screening starting at age 11 and continuing until age 34. Your health care provider may recommend screening at age 71 if you are at increased risk. You will have tests every 1-10 years, depending on your results and the type of screening test.  Diabetes screening. This is done by checking your blood sugar (glucose) after you have not eaten for a while (fasting). You may have this done every 1-3 years.  Sexually transmitted disease (STD) testing. Follow these instructions at home: Eating and drinking  Eat a diet that includes fresh fruits and vegetables, whole grains, lean protein, and low-fat dairy products.  Take vitamin and mineral supplements as recommended by your health care provider.  Do not drink alcohol if your health care provider tells you not to drink.  If you drink alcohol: ? Limit how much you have to 0-2 drinks a day. ? Be aware of how much alcohol is in your drink. In  the [place] one drink equals one 12 oz bottle of beer (355 mL), one 5 oz glass of wine (148 mL), or one 1 oz glass of hard liquor (44 mL). Lifestyle  Take daily care of your teeth and gums.  Stay active. Exercise for at least 30 minutes on 5 or more days each week.  Do not use any products that contain nicotine or tobacco, such as cigarettes, e-cigarettes, and chewing tobacco. If you need help quitting, ask your health care provider.  If you are sexually active, practice safe sex. Use a condom or other form of protection to prevent STIs (sexually transmitted infections).  Talk with your health care provider about taking a low-dose aspirin every day starting at age 28. What's next?  Go to your health care provider once a year for a well check visit.  Ask your health care provider how often you should have your eyes and teeth checked.  Stay up to date on all vaccines. This information is not intended to replace advice given to you by your health care provider. Make sure you discuss any questions you have with your health care provider. Document Revised: 04/21/2018 Document Reviewed: 04/21/2018 Elsevier Patient Education  2020 [place] Maintenance, Male Adopting a healthy lifestyle and getting preventive care are important in promoting health and wellness. Ask your health care provider about:  The right schedule for you to have regular tests and exams.  Things you can do on your own to prevent diseases and keep yourself healthy. What should I know about diet, weight, and exercise? Eat a healthy diet   Eat a diet that includes plenty of vegetables, fruits, low-fat dairy products, and lean protein.  Do not eat a lot of foods that are high in solid fats, added sugars, or sodium. Maintain a healthy weight Body mass index (BMI) is a measurement that can be used to identify possible weight problems. It estimates body fat based on height and weight. Your health care provider can  help determine your BMI and help you achieve or maintain a healthy weight. Get regular exercise Get regular exercise. This is one of the most important things you can do for your health. Most adults should:  Exercise for at least 150 minutes each week. The exercise should increase your heart rate and make you sweat (moderate-intensity exercise).  Do strengthening exercises at least twice a week. This is in addition to the moderate-intensity exercise.  Spend less time sitting. Even light physical activity can be beneficial. Watch cholesterol and blood lipids Have your blood tested for lipids and cholesterol at 61 years of age, then have this test every 5 years. You may need to have your cholesterol levels checked more often if:  Your lipid or cholesterol levels are high.  You are older than 61 years of age.  You are at high risk for heart disease. What should I know about cancer screening? Many types of cancers can be detected early and may often be prevented. Depending on your health history and family history, you may need to have cancer screening at various ages. This may include screening for:  Colorectal cancer.  Prostate cancer.  Skin  cancer.  Lung cancer. What should I know about heart disease, diabetes, and high blood pressure? Blood pressure and heart disease  High blood pressure causes heart disease and increases the risk of stroke. This is more likely to develop in people who have high blood pressure readings, are of African descent, or are overweight.  Talk with your health care provider about your target blood pressure readings.  Have your blood pressure checked: ? Every 3-5 years if you are 27-50 years of age. ? Every year if you are 24 years old or older.  If you are between the ages of 51 and 54 and are a current or former smoker, ask your health care provider if you should have a one-time screening for abdominal aortic aneurysm (AAA). Diabetes Have regular  diabetes screenings. This checks your fasting blood sugar level. Have the screening done:  Once every three years after age 45 if you are at a normal weight and have a low risk for diabetes.  More often and at a younger age if you are overweight or have a high risk for diabetes. What should I know about preventing infection? Hepatitis B If you have a higher risk for hepatitis B, you should be screened for this virus. Talk with your health care provider to find out if you are at risk for hepatitis B infection. Hepatitis C Blood testing is recommended for:  Everyone born from [birth year] through 1967.  Anyone with known risk factors for hepatitis C. Sexually transmitted infections (STIs)  You should be screened each year for STIs, including gonorrhea and chlamydia, if: ? You are sexually active and are younger than 61 years of age. ? You are older than 61 years of age and your health care provider tells you that you are at risk for this type of infection. ? Your sexual activity has changed since you were last screened, and you are at increased risk for chlamydia or gonorrhea. Ask your health care provider if you are at risk.  Ask your health care provider about whether you are at high risk for HIV. Your health care provider may recommend a prescription medicine to help prevent HIV infection. If you choose to take medicine to prevent HIV, you should first get tested for HIV. You should then be tested every 3 months for as long as you are taking the medicine. Follow these instructions at home: Lifestyle  Do not use any products that contain nicotine or tobacco, such as cigarettes, e-cigarettes, and chewing tobacco. If you need help quitting, ask your health care provider.  Do not use street drugs.  Do not share needles.  Ask your health care provider for help if you need support or information about quitting drugs. Alcohol use  Do not drink alcohol if your health care provider tells you not to  drink.  If you drink alcohol: ? Limit how much you have to 0-2 drinks a day. ? Be aware of how much alcohol is in your drink. In the [place] one drink equals one 12 oz bottle of beer (355 mL), one 5 oz glass of wine (148 mL), or one 1 oz glass of hard liquor (44 mL). General instructions  Schedule regular health, dental, and eye exams.  Stay current with your vaccines.  Tell your health care provider if: ? You often feel depressed. ? You have ever been abused or do not feel safe at home. Summary  Adopting a healthy lifestyle and getting preventive care are important in promoting health  and wellness.  Follow your health care provider's instructions about healthy diet, exercising, and getting tested or screened for diseases.  Follow your health care provider's instructions on monitoring your cholesterol and blood pressure. This information is not intended to replace advice given to you by your health care provider. Make sure you discuss any questions you have with your health care provider. Document Revised: 04/20/2018 Document Reviewed: 04/20/2018 Elsevier Patient Education  2020 [employer].

## 2020-03-08 LAB — CBC WITH DIFFERENTIAL/PLATELET
Basophils Absolute: 0.1 10*3/uL (ref 0.0–0.2)
Basos: 1 %
EOS (ABSOLUTE): 0.4 10*3/uL (ref 0.0–0.4)
Eos: 4 %
Hematocrit: 38.6 % (ref 37.5–51.0)
Hemoglobin: 13.7 g/dL (ref 13.0–17.7)
Immature Grans (Abs): 0.1 10*3/uL (ref 0.0–0.1)
Immature Granulocytes: 1 %
Lymphocytes Absolute: 2.6 10*3/uL (ref 0.7–3.1)
Lymphs: 26 %
MCH: 33.5 pg — ABNORMAL HIGH (ref 26.6–33.0)
MCHC: 35.5 g/dL (ref 31.5–35.7)
MCV: 94 fL (ref 79–97)
Monocytes Absolute: 0.9 10*3/uL (ref 0.1–0.9)
Monocytes: 9 %
Neutrophils Absolute: 6 10*3/uL (ref 1.4–7.0)
Neutrophils: 59 %
Platelets: 364 10*3/uL (ref 150–450)
RBC: 4.09 x10E6/uL — ABNORMAL LOW (ref 4.14–5.80)
RDW: 12.7 % (ref 11.6–15.4)
WBC: 10 10*3/uL (ref 3.4–10.8)

## 2020-03-08 LAB — COMPREHENSIVE METABOLIC PANEL
ALT: 24 IU/L (ref 0–44)
AST: 25 IU/L (ref 0–40)
Albumin/Globulin Ratio: 1.6 (ref 1.2–2.2)
Albumin: 4.3 g/dL (ref 3.8–4.8)
Alkaline Phosphatase: 88 IU/L (ref 44–121)
BUN/Creatinine Ratio: 8 — ABNORMAL LOW (ref 10–24)
BUN: 7 mg/dL — ABNORMAL LOW (ref 8–27)
Bilirubin Total: 0.6 mg/dL (ref 0.0–1.2)
CO2: 24 mmol/L (ref 20–29)
Calcium: 9.6 mg/dL (ref 8.6–10.2)
Chloride: 99 mmol/L (ref 96–106)
Creatinine, Ser: 0.87 mg/dL (ref 0.76–1.27)
GFR calc Af Amer: 108 mL/min/{1.73_m2} (ref >59)
GFR calc non Af Amer: 93 mL/min/{1.73_m2} (ref >59)
Globulin, Total: 2.7 g/dL (ref 1.5–4.5)
Glucose: 92 mg/dL (ref 65–99)
Potassium: 5.3 mmol/L — ABNORMAL HIGH (ref 3.5–5.2)
Sodium: 135 mmol/L (ref 134–144)
Total Protein: 7 g/dL (ref 6.0–8.5)

## 2020-03-08 LAB — CARDIOVASCULAR RISK ASSESSMENT

## 2020-03-08 LAB — LIPID PANEL
Chol/HDL Ratio: 2.3 ratio (ref 0.0–5.0)
Cholesterol, Total: 134 mg/dL (ref 100–199)
HDL: 59 mg/dL (ref >39)
LDL Chol Calc (NIH): 63 mg/dL (ref 0–99)
Triglycerides: 55 mg/dL (ref 0–149)
VLDL Cholesterol Cal: 12 mg/dL (ref 5–40)

## 2020-03-08 LAB — HEMOGLOBIN A1C
Est. average glucose Bld gHb Est-mCnc: 180 mg/dL
Hgb A1c MFr Bld: 7.9 % — ABNORMAL HIGH (ref 4.8–5.6)

## 2020-03-11 NOTE — Progress Notes (Signed)
CBC: good CMP: good Lipid panel: great TC 134, Trig 55, HDL 59, LDL HgbA1C:7.9

## 2020-04-03 ENCOUNTER — Other Ambulatory Visit: Payer: Self-pay | Admitting: Nurse Practitioner

## 2020-04-03 DIAGNOSIS — M5136 Other intervertebral disc degeneration, lumbar region: Secondary | ICD-10-CM

## 2020-04-03 DIAGNOSIS — M5126 Other intervertebral disc displacement, lumbar region: Secondary | ICD-10-CM

## 2020-04-03 DIAGNOSIS — M48061 Spinal stenosis, lumbar region without neurogenic claudication: Secondary | ICD-10-CM

## 2020-04-03 DIAGNOSIS — M5440 Lumbago with sciatica, unspecified side: Secondary | ICD-10-CM

## 2020-04-06 ENCOUNTER — Other Ambulatory Visit: Payer: Self-pay | Admitting: Nurse Practitioner

## 2020-04-06 DIAGNOSIS — M48061 Spinal stenosis, lumbar region without neurogenic claudication: Secondary | ICD-10-CM

## 2020-04-06 DIAGNOSIS — M5136 Other intervertebral disc degeneration, lumbar region: Secondary | ICD-10-CM

## 2020-04-06 DIAGNOSIS — M5440 Lumbago with sciatica, unspecified side: Secondary | ICD-10-CM

## 2020-04-22 ENCOUNTER — Other Ambulatory Visit: Payer: Self-pay | Admitting: Physician Assistant

## 2020-04-25 ENCOUNTER — Ambulatory Visit: Payer: Medicaid Other | Admitting: Physician Assistant

## 2020-04-25 ENCOUNTER — Other Ambulatory Visit: Payer: Self-pay

## 2020-04-25 ENCOUNTER — Encounter: Payer: Self-pay | Admitting: Physician Assistant

## 2020-04-25 VITALS — BP 134/82 | HR 82 | Temp 97.5°F | Ht 70.0 in | Wt 193.6 lb

## 2020-04-25 DIAGNOSIS — D229 Melanocytic nevi, unspecified: Secondary | ICD-10-CM | POA: Diagnosis not present

## 2020-04-25 MED ORDER — CEPHALEXIN 500 MG PO CAPS
500.0000 mg | ORAL_CAPSULE | Freq: Two times a day (BID) | ORAL | 0 refills | Status: DC
Start: 2020-04-25 — End: 2020-05-21

## 2020-04-25 MED ORDER — HYDROCODONE-ACETAMINOPHEN 5-325 MG PO TABS: ORAL_TABLET | ORAL | 0 refills | Status: DC

## 2020-04-25 NOTE — Progress Notes (Signed)
Acute Office Visit  Subjective:    Patient ID: Gabriel Gomez, male    DOB: 1960/08/03, 59 y.o.   MRN: [id number]  Chief Complaint  Patient presents with  . Wrist Pain    Mole on left wrist    HPI Patient is in today for painful infected mole on left wrist - pt states that over the past week or so he has a mole that has been changing on his wrist - has been bleeding, red, inflamed and very painful especially to move arm  Past Medical History:  Diagnosis Date  . Coronary artery disease   . Diabetes mellitus without complication ([place])   . GERD (gastroesophageal reflux disease)     Past Surgical History:  Procedure Laterality Date  . LEFT HEART CATH AND CORONARY ANGIOGRAPHY N/A 05/30/2018   Procedure: LEFT HEART CATH AND CORONARY ANGIOGRAPHY;  Surgeon: Sherren Mocha, MD;  Location: [facility];  Service: Cardiovascular;  Laterality: N/A;    Family History  Problem Relation Age of Onset  . CAD Father     Social History   Socioeconomic History  . Marital status: Married    Spouse name: Not on file  . Number of children: Not on file  . Years of education: Not on file  . Highest education level: Not on file  Occupational History  . Not on file  Tobacco Use  . Smoking status: Current Every Day Smoker    Packs/day: 1.00    Types: Cigarettes  . Smokeless tobacco: Never Used  Substance and Sexual Activity  . Alcohol use: Not Currently  . Drug use: Never  . Sexual activity: Yes    Partners: Female  Other Topics Concern  . Not on file  Social History Narrative  . Not on file   Social Determinants of Health   Financial Resource Strain: Not on file  Food Insecurity: Not on file  Transportation Needs: Not on file  Physical Activity: Not on file  Stress: Not on file  Social Connections: Not on file  Intimate Partner Violence: Not on file    Outpatient Medications Prior to Visit  Medication Sig Dispense Refill  . ACCU-CHEK GUIDE test strip USE ONE strip  FOUR TIMES DAILY 550 strip 3  . atorvastatin (LIPITOR) 80 MG tablet TAKE ONE TABLET BY MOUTH ONCE DAILY AT 6pm 90 tablet 0  . benzonatate (TESSALON) 200 MG capsule Take 1 capsule (200 mg total) by mouth 2 (two) times daily as needed for cough. 30 capsule 0  . diclofenac (VOLTAREN) 75 MG EC tablet Take 75 mg by mouth 2 (two) times daily.    Marland Kitchen gabapentin (NEURONTIN) 300 MG capsule Take 300 mg by mouth 3 (three) times daily.    . insulin glargine (LANTUS) 100 UNIT/ML injection Inject 0.3 mLs (30 Units total) into the skin at bedtime. 10 mL 2  . metoprolol tartrate (LOPRESSOR) 25 MG tablet TAKE ONE TABLET BY MOUTH ONCE DAILY 90 tablet 0  . montelukast (SINGULAIR) 10 MG tablet TAKE ONE TABLET BY MOUTH AT BEDTIME 90 tablet 0  . nitroGLYCERIN (NITROSTAT) 0.4 MG SL tablet Place 1 tablet (0.4 mg total) under the tongue every 5 (five) minutes x 3 doses as needed for chest pain. 25 tablet 12  . NOVOLOG FLEXPEN 100 UNIT/ML FlexPen inject 15 units EVERY MORNING, 10 units AT LUNCH AND 10 units EVERY EVENING 15 mL 2  . omeprazole (PRILOSEC) 20 MG capsule TAKE ONE CAPSULE BY MOUTH ONCE DAILY AS NEEDED FOR reflux 90  capsule 0  . ramipril (ALTACE) 5 MG capsule TAKE ONE CAPSULE BY MOUTH ONCE DAILY 90 capsule 0  . UNIFINE PENTIPS 32G X 4 MM MISC AS DIRECTED FOUR TIMES DAILY 100 each 2  . acetaminophen (TYLENOL) 325 MG tablet Take 650 mg by mouth 2 (two) times daily as needed for mild pain.    . traMADol (ULTRAM) 50 MG tablet Take 1 tablet by mouth every 6 (six) hours as needed for pain. Pt needs to call office. 120 tablet 0  . LANTUS SOLOSTAR 100 UNIT/ML Solostar Pen Inject 30 Units into the skin at bedtime.     No facility-administered medications prior to visit.    No Known Allergies  Review of Systems CONSTITUTIONAL: Negative for chills, fatigue, fever, unintentional weight gain and unintentional weight loss.  CARDIOVASCULAR: Negative for chest pain, dizziness, palpitations and pedal edema.  RESPIRATORY:  Negative for recent cough and dyspnea.  INTEGUMENTARY: see HPI        Objective:    Physical Exam PHYSICAL EXAM:   VS: BP 134/82 (BP Location: Left Arm, Patient Position: Sitting, Cuff Size: Normal)   Pulse 82   Temp (!) 97.5 F (36.4 C) (Temporal)   Ht 5\' 10"  (1.778 m)   Wt 193 lb 9.6 oz (87.8 kg)   SpO2 96%   BMI 27.78 kg/m   GEN: Well nourished, well developed, in no acute distress  Cardiac: RRR; no murmurs, rubs, or gallops,no edema -  Respiratory:  normal respiratory rate and pattern with no distress - normal breath sounds with no rales, rhonchi, wheezes or rubs Skin: mole on left wrist with mild erythema surrounding the mole itself - pt guarding arm and limiting movement of arm/wrist -- states he has had 'blood poisoning' before and worried however area minimally irritated  BP 134/82 (BP Location: Left Arm, Patient Position: Sitting, Cuff Size: Normal)   Pulse 82   Temp (!) 97.5 F (36.4 C) (Temporal)   Ht 5\' 10"  (1.778 m)   Wt 193 lb 9.6 oz (87.8 kg)   SpO2 96%   BMI 27.78 kg/m  Wt Readings from Last 3 Encounters:  04/25/20 193 lb 9.6 oz (87.8 kg)  03/07/20 198 lb (89.8 kg)  01/10/20 201 lb (91.2 kg)    Health Maintenance Due  Topic Date Due  . Hepatitis C Screening  Never done  . COLONOSCOPY  Never done  . COVID-19 Vaccine (2 - Booster for Janssen series) 10/10/2019    There are no preventive care reminders to display for this patient.   Lab Results  Component Value Date   TSH 1.570 08/03/2019   Lab Results  Component Value Date   WBC 10.0 03/07/2020   HGB 13.7 03/07/2020   HCT 38.6 03/07/2020   MCV 94 03/07/2020   PLT 364 03/07/2020   Lab Results  Component Value Date   NA 135 03/07/2020   K 5.3 (H) 03/07/2020   CO2 24 03/07/2020   GLUCOSE 92 03/07/2020   BUN 7 (L) 03/07/2020   CREATININE 0.87 03/07/2020   BILITOT 0.6 03/07/2020   ALKPHOS 88 03/07/2020   AST 25 03/07/2020   ALT 24 03/07/2020   PROT 7.0 03/07/2020   ALBUMIN 4.3  03/07/2020   CALCIUM 9.6 03/07/2020   ANIONGAP 9 05/30/2018   Lab Results  Component Value Date   CHOL 134 03/07/2020   Lab Results  Component Value Date   HDL 59 03/07/2020   Lab Results  Component Value Date   LDLCALC 63 03/07/2020  Lab Results  Component Value Date   TRIG 55 03/07/2020   Lab Results  Component Value Date   CHOLHDL 2.3 03/07/2020   Lab Results  Component Value Date   HGBA1C 7.9 (H) 03/07/2020       Assessment & Plan:  1. Atypical nevus - Ambulatory referral to Dermatology   Recommend mole removal since painful/bleeding but will treat infection for now Meds ordered this encounter  Medications  . cephALEXin (KEFLEX) 500 MG capsule    Sig: Take 1 capsule (500 mg total) by mouth 2 (two) times daily.    Dispense:  20 capsule    Refill:  0    Order Specific Question:   Supervising Provider    Answer:   Marge Duncans [id number]  . HYDROcodone-acetaminophen (NORCO/VICODIN) 5-325 MG tablet    Sig: 1 po bid prn arm pain    Dispense:  10 tablet    Refill:  0    Order Specific Question:   Supervising Provider    AnswerMarge Duncans [id number]    Orders Placed This Encounter  Procedures  . Ambulatory referral to Dermatology     Follow-up: No follow-ups on file.  An After Visit Summary was printed and given to the patient.  Yetta Flock [facility] [phone]

## 2020-04-29 ENCOUNTER — Other Ambulatory Visit: Payer: Self-pay | Admitting: Physician Assistant

## 2020-04-29 DIAGNOSIS — M5136 Other intervertebral disc degeneration, lumbar region: Secondary | ICD-10-CM

## 2020-04-29 DIAGNOSIS — M5440 Lumbago with sciatica, unspecified side: Secondary | ICD-10-CM

## 2020-04-29 DIAGNOSIS — M48061 Spinal stenosis, lumbar region without neurogenic claudication: Secondary | ICD-10-CM

## 2020-05-06 ENCOUNTER — Telehealth: Payer: Self-pay

## 2020-05-06 ENCOUNTER — Other Ambulatory Visit: Payer: Self-pay | Admitting: Physician Assistant

## 2020-05-06 DIAGNOSIS — M5136 Other intervertebral disc degeneration, lumbar region: Secondary | ICD-10-CM

## 2020-05-06 DIAGNOSIS — M5440 Lumbago with sciatica, unspecified side: Secondary | ICD-10-CM

## 2020-05-06 DIAGNOSIS — M48061 Spinal stenosis, lumbar region without neurogenic claudication: Secondary | ICD-10-CM

## 2020-05-06 NOTE — Telephone Encounter (Signed)
Patient's daughter called to get a refill on tramadol. At his last visit he appeared to get hydrocodone/apap one twice a day for 5 days. While Gerre Pebbles, [facility], was out Flonnie Hailstone, NP worked with the nurses to get tramadol 50 mg one four times a day approved through insurance for a 30 day supply. However, as Kennon Rounds is his regular provider and will be back in the morning, I recommended defer refill to her. Dr. Sedalia Muta  Notified by Eber Jones, RN.

## 2020-05-06 NOTE — Telephone Encounter (Signed)
Patients daughter Lupita Leash called requesting refill for his Tramadol. Do you need to see patient before it can be refilled? Please advise.

## 2020-05-07 ENCOUNTER — Other Ambulatory Visit: Payer: Self-pay | Admitting: Physician Assistant

## 2020-05-07 DIAGNOSIS — M5442 Lumbago with sciatica, left side: Secondary | ICD-10-CM

## 2020-05-07 NOTE — Telephone Encounter (Signed)
Spoke to patient about need for chronic pain meds Recommend referral to specialist for further evaluation of chronic back pain

## 2020-05-13 DIAGNOSIS — L821 Other seborrheic keratosis: Secondary | ICD-10-CM | POA: Diagnosis not present

## 2020-05-13 DIAGNOSIS — C44629 Squamous cell carcinoma of skin of left upper limb, including shoulder: Secondary | ICD-10-CM | POA: Diagnosis not present

## 2020-05-16 LAB — HM DIABETES EYE EXAM

## 2020-05-20 ENCOUNTER — Other Ambulatory Visit: Payer: Self-pay | Admitting: Family Medicine

## 2020-05-21 ENCOUNTER — Encounter: Payer: Self-pay | Admitting: Physician Assistant

## 2020-05-21 ENCOUNTER — Telehealth (INDEPENDENT_AMBULATORY_CARE_PROVIDER_SITE_OTHER): Payer: Medicaid Other | Admitting: Physician Assistant

## 2020-05-21 VITALS — BP 170/67 | HR 64 | Temp 96.3°F | Ht 70.0 in | Wt 197.0 lb

## 2020-05-21 DIAGNOSIS — J060 Acute laryngopharyngitis: Secondary | ICD-10-CM

## 2020-05-21 HISTORY — DX: Acute laryngopharyngitis: J06.0

## 2020-05-21 MED ORDER — AZITHROMYCIN 250 MG PO TABS: ORAL_TABLET | ORAL | 0 refills | Status: DC

## 2020-05-21 MED ORDER — HYDROCODONE-HOMATROPINE 5-1.5 MG/5ML PO SYRP: 5.0000 mL | ORAL_SOLUTION | Freq: Three times a day (TID) | ORAL | 0 refills | Status: DC | PRN

## 2020-05-21 NOTE — Progress Notes (Signed)
Virtual Visit via Telephone Note   This visit type was conducted due to national recommendations for restrictions regarding the COVID-19 Pandemic (e.g. social distancing) in an effort to limit this patient's exposure and mitigate transmission in our community.  Due to his co-morbid illnesses, this patient is at least at moderate risk for complications without adequate follow up.  This format is felt to be most appropriate for this patient at this time.  The patient did not have access to video technology/had technical difficulties with video requiring transitioning to audio format only (telephone).  All issues noted in this document were discussed and addressed.  No physical exam could be performed with this format.  Patient verbally consented to a telehealth visit.   Date:  05/21/2020   ID:  Gabriel Gomez, DOB 1961/04/22, MRN [id number]  Patient Location: Home Provider Location: Office  PCP:  Marge Duncans, PA-C    Chief Complaint:  [place]  History of Present Illness:    Gabriel Gomez is a 60 y.o. male with complaints of uri and cough States that yesterday he woke up not feeling well and started with productive cough , headache, and chills/sweats Today with same symptoms and worsening  The patient does have symptoms concerning for COVID-19 infection (fever, chills, cough, or new shortness of breath).    Past Medical History:  Diagnosis Date  . Coronary artery disease   . Diabetes mellitus without complication ([place])   . GERD (gastroesophageal reflux disease)    Past Surgical History:  Procedure Laterality Date  . LEFT HEART CATH AND CORONARY ANGIOGRAPHY N/A 05/30/2018   Procedure: LEFT HEART CATH AND CORONARY ANGIOGRAPHY;  Surgeon: Sherren Mocha, MD;  Location: [facility];  Service: Cardiovascular;  Laterality: N/A;     Current Meds  Medication Sig  . ACCU-CHEK GUIDE test strip USE ONE strip FOUR TIMES DAILY  . atorvastatin (LIPITOR) 80 MG tablet TAKE ONE  TABLET BY MOUTH ONCE DAILY AT 6pm  . azithromycin (ZITHROMAX) 250 MG tablet 2 po day one then one po days 2-5  . benzonatate (TESSALON) 200 MG capsule Take 1 capsule (200 mg total) by mouth 2 (two) times daily as needed for cough.  . diclofenac (VOLTAREN) 75 MG EC tablet Take 75 mg by mouth 2 (two) times daily.  Marland Kitchen gabapentin (NEURONTIN) 300 MG capsule Take 300 mg by mouth 3 (three) times daily.  Marland Kitchen HYDROcodone-homatropine (HYCODAN) 5-1.5 MG/5ML syrup Take 5 mLs by mouth every 8 (eight) hours as needed for cough.  . insulin glargine (LANTUS) 100 UNIT/ML injection Inject 0.3 mLs (30 Units total) into the skin at bedtime.  . metoprolol tartrate (LOPRESSOR) 25 MG tablet TAKE ONE TABLET BY MOUTH ONCE DAILY  . montelukast (SINGULAIR) 10 MG tablet TAKE ONE TABLET BY MOUTH AT BEDTIME  . nitroGLYCERIN (NITROSTAT) 0.4 MG SL tablet Place 1 tablet (0.4 mg total) under the tongue every 5 (five) minutes x 3 doses as needed for chest pain.  Marland Kitchen NOVOLOG FLEXPEN 100 UNIT/ML FlexPen inject 15 units EVERY MORNING, 10 units AT LUNCH AND 10 units EVERY EVENING  . omeprazole (PRILOSEC) 20 MG capsule TAKE ONE CAPSULE BY MOUTH ONCE DAILY AS NEEDED FOR reflux  . ramipril (ALTACE) 5 MG capsule TAKE ONE CAPSULE BY MOUTH ONCE DAILY  . traMADol (ULTRAM) 50 MG tablet Take 1 tablet by mouth every 6 (six) hours as needed for pain. Pt needs to call office.  Marland Kitchen UNIFINE PENTIPS 32G X 4 MM MISC AS DIRECTED FOUR TIMES DAILY  Allergies:   Patient has no known allergies.   Social History   Tobacco Use  . Smoking status: Current Every Day Smoker    Packs/day: 1.00    Types: Cigarettes  . Smokeless tobacco: Never Used  Substance Use Topics  . Alcohol use: Not Currently  . Drug use: Never     Family Hx: The patient's family history includes CAD in his father.  ROS:   Please see the history of present illness.    All other systems reviewed and are negative.  Labs/Other Tests and Data Reviewed:    Recent  Labs: 08/03/2019: TSH 1.570 03/07/2020: ALT 24; BUN 7; Creatinine, Ser 0.87; Hemoglobin 13.7; Platelets 364; Potassium 5.3; Sodium 135   Recent Lipid Panel Lab Results  Component Value Date/Time   CHOL 134 03/07/2020 08:35 AM   TRIG 55 03/07/2020 08:35 AM   HDL 59 03/07/2020 08:35 AM   CHOLHDL 2.3 03/07/2020 08:35 AM   CHOLHDL 4.9 05/29/2018 03:51 AM   LDLCALC 63 03/07/2020 08:35 AM    Wt Readings from Last 3 Encounters:  05/21/20 197 lb (89.4 kg)  04/25/20 193 lb 9.6 oz (87.8 kg)  03/07/20 198 lb (89.8 kg)     Objective:    Vital Signs:  BP (!) 170/67   Pulse 64   Temp (!) 96.3 F (35.7 C)   Ht 5\' 10"  (1.778 m)   Wt 197 lb (89.4 kg)   BMI 28.27 kg/m    VITAL SIGNS:  reviewed  ASSESSMENT & PLAN:    1. URI/cough - COVID PCR test pending - recommend rest, fluids, tylenol and quarantine until results back - rx for zpack and hydromet sent in - follow up if symptoms change or worsen  COVID-19 Education: The signs and symptoms of COVID-19 were discussed with the patient and how to seek care for testing (follow up with PCP or arrange E-visit). The importance of social distancing was discussed today.  Time:   Today, I have spent 10 minutes with the patient with telehealth technology discussing the above problems.   Whitney Wright cma obtained history and vitals Medication Adjustments/Labs and Tests Ordered: Current medicines are reviewed at length with the patient today.  Concerns regarding medicines are outlined above.   Tests Ordered: Orders Placed This Encounter  Procedures  . Novel Coronavirus, NAA ([facility])    Medication Changes: Meds ordered this encounter  Medications  . azithromycin (ZITHROMAX) 250 MG tablet    Sig: 2 po day one then one po days 2-5    Dispense:  6 each    Refill:  0    Order Specific Question:   Supervising Provider    AnswerShelton Silvas  . HYDROcodone-homatropine (HYCODAN) 5-1.5 MG/5ML syrup    Sig: Take 5 mLs by mouth  every 8 (eight) hours as needed for cough.    Dispense:  120 mL    Refill:  0    Order Specific Question:   Supervising Provider    AnswerShelton Silvas    Follow Up:  In Person prn Continue to monitor bp Signed, Yetta Flock  05/21/2020 2:48 PM    [place]

## 2020-05-23 LAB — SARS-COV-2, NAA 2 DAY TAT

## 2020-05-23 LAB — NOVEL CORONAVIRUS, NAA: SARS-CoV-2, NAA: NOT DETECTED

## 2020-05-28 DIAGNOSIS — H5213 Myopia, bilateral: Secondary | ICD-10-CM | POA: Diagnosis not present

## 2020-05-28 DIAGNOSIS — H524 Presbyopia: Secondary | ICD-10-CM | POA: Diagnosis not present

## 2020-05-29 ENCOUNTER — Other Ambulatory Visit: Payer: Self-pay | Admitting: Physician Assistant

## 2020-05-30 ENCOUNTER — Encounter: Payer: Self-pay | Admitting: Physician Assistant

## 2020-06-06 ENCOUNTER — Other Ambulatory Visit: Payer: Self-pay | Admitting: Physician Assistant

## 2020-06-06 DIAGNOSIS — M5126 Other intervertebral disc displacement, lumbar region: Secondary | ICD-10-CM

## 2020-06-06 DIAGNOSIS — M5440 Lumbago with sciatica, unspecified side: Secondary | ICD-10-CM

## 2020-06-06 DIAGNOSIS — M5416 Radiculopathy, lumbar region: Secondary | ICD-10-CM | POA: Diagnosis not present

## 2020-06-06 DIAGNOSIS — M48061 Spinal stenosis, lumbar region without neurogenic claudication: Secondary | ICD-10-CM

## 2020-06-06 DIAGNOSIS — M5136 Other intervertebral disc degeneration, lumbar region: Secondary | ICD-10-CM

## 2020-06-10 ENCOUNTER — Other Ambulatory Visit: Payer: Self-pay

## 2020-06-10 ENCOUNTER — Ambulatory Visit: Payer: Medicaid Other | Admitting: Physician Assistant

## 2020-06-10 ENCOUNTER — Encounter: Payer: Self-pay | Admitting: Physician Assistant

## 2020-06-10 VITALS — BP 120/74 | HR 79 | Temp 97.2°F | Ht 70.0 in | Wt 192.0 lb

## 2020-06-10 DIAGNOSIS — M5440 Lumbago with sciatica, unspecified side: Secondary | ICD-10-CM

## 2020-06-10 DIAGNOSIS — E119 Type 2 diabetes mellitus without complications: Secondary | ICD-10-CM | POA: Diagnosis not present

## 2020-06-10 DIAGNOSIS — Z794 Long term (current) use of insulin: Secondary | ICD-10-CM

## 2020-06-10 DIAGNOSIS — E782 Mixed hyperlipidemia: Secondary | ICD-10-CM | POA: Diagnosis not present

## 2020-06-10 DIAGNOSIS — I10 Essential (primary) hypertension: Secondary | ICD-10-CM

## 2020-06-10 HISTORY — DX: Lumbago with sciatica, unspecified side: M54.40

## 2020-06-10 NOTE — Progress Notes (Signed)
Subjective:  Patient ID: Gabriel Gomez, male    DOB: 1960/11/26  Age: 60 y.o. MRN: [id number]  Chief Complaint  Patient presents with  . Diabetes    40M follow Up    Diabetes Pertinent negatives for hypoglycemia include no dizziness, headaches or nervousness/anxiousness. Pertinent negatives for diabetes include no chest pain, no fatigue and no weakness.    Gabriel Gomez is a 60 year old Caucasian male present for follow-up for insulin dependent diabetes, hypertension, and hyperlipidemia. He is up-to-date with colonoscopy screening, has received flu and COVID-19 vaccination this year. He is due for diabetic eye exam. Pt defers pneumovax today  Type 2 Diabetes Mellitus with long-term insulin use Patient has been a diabetic for 26 years. He is moderately-controlled with Novolog insulin (15U Q am, 10U Q lunch, and Q dinner) and Lantus insulin (30 U QHS). He performs regular blood glucose checks daily, states they average between 100-130. States hypoglycemia episodes are rare.  Hypertension Patient has a history of moderately-controlled hypertension. BP 120/74. Current treatment includes Ramipril 5mg  and Metoprolol 25 mg daily. Denies headache, dyspnea, chest pain or taking NTG. He states he has not experienced any hypotensive episodes, dizziness, or falls.  Hyperlipidemia Patient has a history of hyperlipidemia for more than 1-year. Current treatment includes Lipitor 80 mg daily. He is moderately controlled.He is not currently exercising due  To chronic back and left hip pain.   Chronic Pain Patient has chronic pain to left hip and lower back. He has a history of left total hip replacement in [place] in approximately 3 years ago. He states he has continued to have pain to left hip. MRI on 09/09/19 shows L5-S1 mild disc bulging and subarticular stenosis. His pain is moderately-controlled with Tramadol 50 mg Q8 hours and Gabapentin 300mg  TID. He is currently following with Neurosurgery and will be  having nerve conduction study in next week or two     Current Outpatient Medications on File Prior to Visit  Medication Sig Dispense Refill  . ACCU-CHEK GUIDE test strip USE ONE strip FOUR TIMES DAILY 100 strip 0  . atorvastatin (LIPITOR) 80 MG tablet TAKE ONE TABLET BY MOUTH ONCE DAILY AT 6pm 90 tablet 0  . diclofenac (VOLTAREN) 75 MG EC tablet Take 75 mg by mouth 2 (two) times daily.    Marland Kitchen gabapentin (NEURONTIN) 300 MG capsule Take 300 mg by mouth 3 (three) times daily.    . insulin glargine (LANTUS) 100 UNIT/ML injection Inject 0.3 mLs (30 Units total) into the skin at bedtime. 10 mL 2  . metoprolol tartrate (LOPRESSOR) 25 MG tablet TAKE ONE TABLET BY MOUTH ONCE DAILY 90 tablet 0  . montelukast (SINGULAIR) 10 MG tablet TAKE ONE TABLET BY MOUTH AT BEDTIME 90 tablet 0  . nitroGLYCERIN (NITROSTAT) 0.4 MG SL tablet Place 1 tablet (0.4 mg total) under the tongue every 5 (five) minutes x 3 doses as needed for chest pain. 25 tablet 12  . NOVOLOG FLEXPEN 100 UNIT/ML FlexPen inject 15 units EVERY MORNING, 10 units AT LUNCH AND 10 units EVERY EVENING 15 mL 2  . omeprazole (PRILOSEC) 20 MG capsule TAKE ONE CAPSULE BY MOUTH ONCE DAILY AS NEEDED FOR reflux 90 capsule 0  . ramipril (ALTACE) 5 MG capsule TAKE ONE CAPSULE BY MOUTH ONCE DAILY 90 capsule 0  . traMADol (ULTRAM) 50 MG tablet Take 1 tablet (50 mg total) by mouth 3 (three) times daily as needed. 90 tablet 0  . UNIFINE PENTIPS 32G X 4 MM MISC AS DIRECTED FOUR TIMES DAILY  100 each 2   No current facility-administered medications on file prior to visit.   Past Medical History:  Diagnosis Date  . Coronary artery disease   . Diabetes mellitus without complication ([place])   . GERD (gastroesophageal reflux disease)    Past Surgical History:  Procedure Laterality Date  . LEFT HEART CATH AND CORONARY ANGIOGRAPHY N/A 05/30/2018   Procedure: LEFT HEART CATH AND CORONARY ANGIOGRAPHY;  Surgeon: Sherren Mocha, MD;  Location: [facility];  Service:  Cardiovascular;  Laterality: N/A;    Family History  Problem Relation Age of Onset  . CAD Father    Social History   Socioeconomic History  . Marital status: Married    Spouse name: Not on file  . Number of children: Not on file  . Years of education: Not on file  . Highest education level: Not on file  Occupational History  . Not on file  Tobacco Use  . Smoking status: Current Every Day Smoker    Packs/day: 1.00    Types: Cigarettes  . Smokeless tobacco: Never Used  Substance and Sexual Activity  . Alcohol use: Not Currently  . Drug use: Never  . Sexual activity: Yes    Partners: Female  Other Topics Concern  . Not on file  Social History Narrative  . Not on file   Social Determinants of Health   Financial Resource Strain: Not on file  Food Insecurity: Not on file  Transportation Needs: Not on file  Physical Activity: Not on file  Stress: Not on file  Social Connections: Not on file    CONSTITUTIONAL: Negative for chills, fatigue, fever, unintentional weight gain and unintentional weight loss.  E/N/T: Negative for ear pain, nasal congestion and sore throat.  CARDIOVASCULAR: Negative for chest pain, dizziness, palpitations and pedal edema.  RESPIRATORY: Negative for recent cough and dyspnea.  GASTROINTESTINAL: Negative for abdominal pain, acid reflux symptoms, constipation, diarrhea, nausea and vomiting.  MSK: see HPI INTEGUMENTARY: Negative for rash.  NEUROLOGICAL: Negative for dizziness and headaches.  PSYCHIATRIC: Negative for sleep disturbance and to question depression screen.  Negative for depression, negative for anhedonia.       Objective:  BP 120/74 (BP Location: Left Arm, Patient Position: Sitting, Cuff Size: Normal)   Pulse 79   Temp (!) 97.2 F (36.2 C) (Temporal)   Ht 5\' 10"  (1.778 m)   Wt 192 lb (87.1 kg)   SpO2 98%   BMI 27.55 kg/m   BP/Weight 06/10/2020 05/21/2020 89/38/1017  Systolic BP 510 258 527  Diastolic BP 74 67 82  Wt. (Lbs) 192  197 193.6  BMI 27.55 28.27 27.78               Lab Results  Component Value Date   WBC 10.0 03/07/2020   HGB 13.7 03/07/2020   HCT 38.6 03/07/2020   PLT 364 03/07/2020   GLUCOSE 92 03/07/2020   CHOL 134 03/07/2020   TRIG 55 03/07/2020   HDL 59 03/07/2020   LDLCALC 63 03/07/2020   ALT 24 03/07/2020   AST 25 03/07/2020   NA 135 03/07/2020   K 5.3 (H) 03/07/2020   CL 99 03/07/2020   CREATININE 0.87 03/07/2020   BUN 7 (L) 03/07/2020   CO2 24 03/07/2020   TSH 1.570 08/03/2019   INR 1.01 05/28/2018   HGBA1C 7.9 (H) 03/07/2020   MICROALBUR neg 08/03/2019   PHYSICAL EXAM:   VS: BP 120/74 (BP Location: Left Arm, Patient Position: Sitting, Cuff Size: Normal)  Pulse 79   Temp (!) 97.2 F (36.2 C) (Temporal)   Ht 5\' 10"  (1.778 m)   Wt 192 lb (87.1 kg)   SpO2 98%   BMI 27.55 kg/m   GEN: Well nourished, well developed, in no acute distress   Neck: no JVD or masses - no thyromegaly - no bruits Cardiac: RRR; no murmurs, rubs, or gallops,no edema -  Respiratory:  normal respiratory rate and pattern with no distress - normal breath sounds with no rales, rhonchi, wheezes or rubs  MS: no deformity or atrophy  Skin: warm and dry, no rash  Neuro:  Alert and Oriented x 3, Strength and sensation are intact - CN II-Xii grossly intact Psych: euthymic mood, appropriate affect and demeanor    Assessment & Plan:   1. Essential hypertension, benign - CBC with Differential/Platelet - Comprehensive metabolic panel Continue meds  2. Type 2 diabetes mellitus without complication, with long-term current use of insulin (HCC) - Hemoglobin A1c Continue meds and watch diet 3. Mixed hyperlipidemia - Lipid panel 4. Chronic low back pain with sciatica Continue current meds and follow up with neurosurgeon as directed    No orders of the defined types were placed in this encounter.   Orders Placed This Encounter  Procedures  . CBC with Differential/Platelet  . Comprehensive  metabolic panel  . Hemoglobin A1c  . Lipid panel     Follow-up: Return in about 3 months (around 09/07/2020) for chronic fasting.  An After Visit Summary was printed and given to the patient.

## 2020-06-11 ENCOUNTER — Other Ambulatory Visit: Payer: Self-pay | Admitting: Physician Assistant

## 2020-06-11 ENCOUNTER — Other Ambulatory Visit: Payer: Medicaid Other

## 2020-06-11 DIAGNOSIS — R799 Abnormal finding of blood chemistry, unspecified: Secondary | ICD-10-CM

## 2020-06-11 DIAGNOSIS — R899 Unspecified abnormal finding in specimens from other organs, systems and tissues: Secondary | ICD-10-CM

## 2020-06-11 LAB — CBC WITH DIFFERENTIAL/PLATELET
Basophils Absolute: 0.1 10*3/uL (ref 0.0–0.2)
Basos: 1 %
EOS (ABSOLUTE): 0.3 10*3/uL (ref 0.0–0.4)
Eos: 2 %
Hematocrit: 39.1 % (ref 37.5–51.0)
Hemoglobin: 13.7 g/dL (ref 13.0–17.7)
Immature Grans (Abs): 0.1 10*3/uL (ref 0.0–0.1)
Immature Granulocytes: 1 %
Lymphocytes Absolute: 3.3 10*3/uL — ABNORMAL HIGH (ref 0.7–3.1)
Lymphs: 26 %
MCH: 33.2 pg — ABNORMAL HIGH (ref 26.6–33.0)
MCHC: 35 g/dL (ref 31.5–35.7)
MCV: 95 fL (ref 79–97)
Monocytes Absolute: 1 10*3/uL — ABNORMAL HIGH (ref 0.1–0.9)
Monocytes: 8 %
Neutrophils Absolute: 8.3 10*3/uL — ABNORMAL HIGH (ref 1.4–7.0)
Neutrophils: 62 %
Platelets: 338 10*3/uL (ref 150–450)
RBC: 4.13 x10E6/uL — ABNORMAL LOW (ref 4.14–5.80)
RDW: 12 % (ref 11.6–15.4)
WBC: 13 10*3/uL — ABNORMAL HIGH (ref 3.4–10.8)

## 2020-06-11 LAB — COMPREHENSIVE METABOLIC PANEL
ALT: 18 IU/L (ref 0–44)
AST: 20 IU/L (ref 0–40)
Albumin/Globulin Ratio: 1.6 (ref 1.2–2.2)
Albumin: 4.2 g/dL (ref 3.8–4.8)
Alkaline Phosphatase: 88 IU/L (ref 44–121)
BUN/Creatinine Ratio: 10 (ref 10–24)
BUN: 10 mg/dL (ref 8–27)
Bilirubin Total: 0.7 mg/dL (ref 0.0–1.2)
CO2: 19 mmol/L — ABNORMAL LOW (ref 20–29)
Calcium: 9.4 mg/dL (ref 8.6–10.2)
Chloride: 92 mmol/L — ABNORMAL LOW (ref 96–106)
Creatinine, Ser: 1.04 mg/dL (ref 0.76–1.27)
GFR calc Af Amer: 89 mL/min/{1.73_m2} (ref >59)
GFR calc non Af Amer: 77 mL/min/{1.73_m2} (ref >59)
Globulin, Total: 2.6 g/dL (ref 1.5–4.5)
Glucose: 117 mg/dL — ABNORMAL HIGH (ref 65–99)
Potassium: 6.6 mmol/L (ref 3.5–5.2)
Sodium: 125 mmol/L — ABNORMAL LOW (ref 134–144)
Total Protein: 6.8 g/dL (ref 6.0–8.5)

## 2020-06-11 LAB — CARDIOVASCULAR RISK ASSESSMENT

## 2020-06-11 LAB — LIPID PANEL
Chol/HDL Ratio: 2.7 ratio (ref 0.0–5.0)
Cholesterol, Total: 131 mg/dL (ref 100–199)
HDL: 49 mg/dL (ref >39)
LDL Chol Calc (NIH): 66 mg/dL (ref 0–99)
Triglycerides: 81 mg/dL (ref 0–149)
VLDL Cholesterol Cal: 16 mg/dL (ref 5–40)

## 2020-06-11 LAB — HEMOGLOBIN A1C
Est. average glucose Bld gHb Est-mCnc: 174 mg/dL
Hgb A1c MFr Bld: 7.7 % — ABNORMAL HIGH (ref 4.8–5.6)

## 2020-06-11 LAB — POTASSIUM: Potassium: 4.8 mmol/L (ref 3.5–5.2)

## 2020-07-01 DIAGNOSIS — G629 Polyneuropathy, unspecified: Secondary | ICD-10-CM

## 2020-07-01 DIAGNOSIS — G5732 Lesion of lateral popliteal nerve, left lower limb: Secondary | ICD-10-CM | POA: Diagnosis not present

## 2020-07-01 HISTORY — DX: Polyneuropathy, unspecified: G62.9

## 2020-07-25 ENCOUNTER — Other Ambulatory Visit: Payer: Self-pay | Admitting: Physician Assistant

## 2020-08-02 DIAGNOSIS — Z6827 Body mass index (BMI) 27.0-27.9, adult: Secondary | ICD-10-CM | POA: Insufficient documentation

## 2020-08-02 DIAGNOSIS — G629 Polyneuropathy, unspecified: Secondary | ICD-10-CM | POA: Diagnosis not present

## 2020-08-02 HISTORY — DX: Body mass index (BMI) 27.0-27.9, adult: Z68.27

## 2020-08-19 ENCOUNTER — Other Ambulatory Visit: Payer: Self-pay | Admitting: Physician Assistant

## 2020-08-26 ENCOUNTER — Telehealth (INDEPENDENT_AMBULATORY_CARE_PROVIDER_SITE_OTHER): Payer: Medicaid Other | Admitting: Physician Assistant

## 2020-08-26 ENCOUNTER — Encounter: Payer: Self-pay | Admitting: Physician Assistant

## 2020-08-26 VITALS — BP 102/81 | Temp 96.9°F

## 2020-08-26 DIAGNOSIS — J060 Acute laryngopharyngitis: Secondary | ICD-10-CM

## 2020-08-26 MED ORDER — ALBUTEROL SULFATE HFA 108 (90 BASE) MCG/ACT IN AERS: 2.0000 | INHALATION_SPRAY | Freq: Four times a day (QID) | RESPIRATORY_TRACT | 0 refills | Status: DC | PRN

## 2020-08-26 MED ORDER — HYDROCODONE-HOMATROPINE 5-1.5 MG/5ML PO SYRP: 5.0000 mL | ORAL_SOLUTION | Freq: Three times a day (TID) | ORAL | 0 refills | Status: DC | PRN

## 2020-08-26 MED ORDER — AMOXICILLIN-POT CLAVULANATE 875-125 MG PO TABS
1.0000 | ORAL_TABLET | Freq: Two times a day (BID) | ORAL | 0 refills | Status: DC
Start: 2020-08-26 — End: 2020-09-10

## 2020-08-26 NOTE — Progress Notes (Signed)
Virtual Visit via Telephone Note   This visit type was conducted due to national recommendations for restrictions regarding the COVID-19 Pandemic (e.g. social distancing) in an effort to limit this patient's exposure and mitigate transmission in our community.  Due to his co-morbid illnesses, this patient is at least at moderate risk for complications without adequate follow up.  This format is felt to be most appropriate for this patient at this time.  The patient did not have access to video technology/had technical difficulties with video requiring transitioning to audio format only (telephone).  All issues noted in this document were discussed and addressed.  No physical exam could be performed with this format.  Patient verbally consented to a telehealth visit.   Date:  08/26/2020   ID:  Gabriel Gomez, DOB Jan 20, 1961, MRN [id number]  Patient Location: Home Provider Location: Office  PCP:  Marge Duncans, PA-C    Chief Complaint: uri/cough  History of Present Illness:    Jaquari Reckner is a 60 y.o. male with URI/cough -- pt states since Friday he has had cough, congestion and mild wheezing - has not had a fever Has had some mild sore throat and cough has been productive   The patient does have symptoms concerning for COVID-19 infection (fever, chills, cough, or new shortness of breath).    Past Medical History:  Diagnosis Date  . Coronary artery disease   . Diabetes mellitus without complication ([place])   . GERD (gastroesophageal reflux disease)    Past Surgical History:  Procedure Laterality Date  . LEFT HEART CATH AND CORONARY ANGIOGRAPHY N/A 05/30/2018   Procedure: LEFT HEART CATH AND CORONARY ANGIOGRAPHY;  Surgeon: Sherren Mocha, MD;  Location: [facility];  Service: Cardiovascular;  Laterality: N/A;     Current Meds  Medication Sig  . albuterol (VENTOLIN HFA) 108 (90 Base) MCG/ACT inhaler Inhale 2 puffs into the lungs every 6 (six) hours as needed for wheezing  or shortness of breath.  Marland Kitchen amoxicillin-clavulanate (AUGMENTIN) 875-125 MG tablet Take 1 tablet by mouth 2 (two) times daily.  Marland Kitchen HYDROcodone-homatropine (HYCODAN) 5-1.5 MG/5ML syrup Take 5 mLs by mouth every 8 (eight) hours as needed for cough.     Allergies:   Patient has no known allergies.   Social History   Tobacco Use  . Smoking status: Current Every Day Smoker    Packs/day: 1.00    Types: Cigarettes  . Smokeless tobacco: Never Used  Substance Use Topics  . Alcohol use: Not Currently  . Drug use: Never     Family Hx: The patient's family history includes CAD in his father.  ROS:   Please see the history of present illness.    All other systems reviewed and are negative.  Labs/Other Tests and Data Reviewed:    Recent Labs: 06/10/2020: ALT 18; BUN 10; Creatinine, Ser 1.04; Hemoglobin 13.7; Platelets 338; Sodium 125 06/11/2020: Potassium 4.8   Recent Lipid Panel Lab Results  Component Value Date/Time   CHOL 131 06/10/2020 08:45 AM   TRIG 81 06/10/2020 08:45 AM   HDL 49 06/10/2020 08:45 AM   CHOLHDL 2.7 06/10/2020 08:45 AM   CHOLHDL 4.9 05/29/2018 03:51 AM   LDLCALC 66 06/10/2020 08:45 AM    Wt Readings from Last 3 Encounters:  06/10/20 192 lb (87.1 kg)  05/21/20 197 lb (89.4 kg)  04/25/20 193 lb 9.6 oz (87.8 kg)     Objective:    Vital Signs:  BP 102/81   Temp (!) 96.9 F (36.1 C)  SpO2 96%    VITAL SIGNS:  reviewed  ASSESSMENT & PLAN:    1. URI/cough --- pt did not want to come for testing for flu or COVID - recommend 5 day quarantine --- will treat with augmentin, albuterol and hydromet - he is to call back if any symptoms change or worsen  COVID-19 Education: The signs and symptoms of COVID-19 were discussed with the patient and how to seek care for testing (follow up with PCP or arrange E-visit). The importance of social distancing was discussed today.  Time:   Today, I have spent 10 minutes with the patient with telehealth technology discussing  the above problems.     Medication Adjustments/Labs and Tests Ordered: Current medicines are reviewed at length with the patient today.  Concerns regarding medicines are outlined above.   Tests Ordered: No orders of the defined types were placed in this encounter.   Medication Changes: Meds ordered this encounter  Medications  . amoxicillin-clavulanate (AUGMENTIN) 875-125 MG tablet    Sig: Take 1 tablet by mouth 2 (two) times daily.    Dispense:  20 tablet    Refill:  0    Order Specific Question:   Supervising Provider    AnswerRochel Brome [id number]  . HYDROcodone-homatropine (HYCODAN) 5-1.5 MG/5ML syrup    Sig: Take 5 mLs by mouth every 8 (eight) hours as needed for cough.    Dispense:  120 mL    Refill:  0    Order Specific Question:   Supervising Provider    AnswerRochel Brome [id number]  . albuterol (VENTOLIN HFA) 108 (90 Base) MCG/ACT inhaler    Sig: Inhale 2 puffs into the lungs every 6 (six) hours as needed for wheezing or shortness of breath.    Dispense:  8 g    Refill:  0    Order Specific Question:   Supervising Provider    AnswerShelton Silvas    Follow Up:  In Person prn  Signed, Yetta Flock  08/26/2020 8:52 AM    [place]

## 2020-09-09 ENCOUNTER — Other Ambulatory Visit: Payer: Self-pay | Admitting: Physician Assistant

## 2020-09-10 ENCOUNTER — Ambulatory Visit (INDEPENDENT_AMBULATORY_CARE_PROVIDER_SITE_OTHER): Payer: Medicaid Other | Admitting: Physician Assistant

## 2020-09-10 ENCOUNTER — Encounter: Payer: Self-pay | Admitting: Physician Assistant

## 2020-09-10 ENCOUNTER — Other Ambulatory Visit: Payer: Self-pay

## 2020-09-10 VITALS — BP 118/68 | HR 67 | Temp 97.5°F | Ht 70.0 in | Wt 188.0 lb

## 2020-09-10 DIAGNOSIS — Z794 Long term (current) use of insulin: Secondary | ICD-10-CM | POA: Diagnosis not present

## 2020-09-10 DIAGNOSIS — E119 Type 2 diabetes mellitus without complications: Secondary | ICD-10-CM | POA: Diagnosis not present

## 2020-09-10 DIAGNOSIS — E782 Mixed hyperlipidemia: Secondary | ICD-10-CM | POA: Diagnosis not present

## 2020-09-10 DIAGNOSIS — G629 Polyneuropathy, unspecified: Secondary | ICD-10-CM | POA: Diagnosis not present

## 2020-09-10 DIAGNOSIS — K219 Gastro-esophageal reflux disease without esophagitis: Secondary | ICD-10-CM | POA: Diagnosis not present

## 2020-09-10 DIAGNOSIS — I10 Essential (primary) hypertension: Secondary | ICD-10-CM | POA: Diagnosis not present

## 2020-09-10 MED ORDER — GABAPENTIN 300 MG PO CAPS: 300.0000 mg | ORAL_CAPSULE | Freq: Three times a day (TID) | ORAL | 2 refills | Status: DC

## 2020-09-10 NOTE — Progress Notes (Signed)
Subjective:  Patient ID: Gabriel Gomez, male    DOB: Sep 14, 1960  Age: 60 y.o. MRN: [id number]  Chief Complaint  Patient presents with  . Hypertension    Diabetes Pertinent negatives for hypoglycemia include no dizziness, headaches or nervousness/anxiousness. Pertinent negatives for diabetes include no chest pain, no fatigue and no weakness.  Hypertension Pertinent negatives include no chest pain or headaches.    Gabriel Gomez is a 60 year old Caucasian male present for follow-up for insulin dependent diabetes, hypertension, and hyperlipidemia. He is up-to-date with colonoscopy screening, has received flu and COVID-19 vaccination this year. He is due for diabetic eye exam. Pt defers covid booster today  Type 2 Diabetes Mellitus with long-term insulin use Patient has been a diabetic for 26 years. He is moderately-controlled with Novolog insulin (15U Q am, 10U Q lunch, and Q dinner) and Lantus insulin (30 U QHS). He performs regular blood glucose checks daily, states they average between 100-130. States hypoglycemia episodes are rare.  Hypertension Patient has a history of moderately-controlled hypertension. BP 120/74. Current treatment includes Ramipril 5mg  and Metoprolol 25 mg daily. Denies headache, dyspnea, chest pain or taking NTG. He states he has not experienced any hypotensive episodes, dizziness, or falls.  Hyperlipidemia Patient has a history of hyperlipidemia for more than 1-year. Current treatment includes Lipitor 80 mg daily. He is moderately controlled.He is not currently exercising due  To chronic back and left hip pain.   Chronic Pain Patient has chronic pain to left hip and lower back. He has a history of left total hip replacement in [place] in approximately 3 years ago. He states he has continued to have pain to left hip. MRI on 09/09/19 shows L5-S1 mild disc bulging and subarticular stenosis. He states right now he is 'managing' his pain -- would like refill of  gabapentin Has seen back specialist and had nerve conduction study - told was neuropathy and not originating from spinal stenosis   Current Outpatient Medications on File Prior to Visit  Medication Sig Dispense Refill  . ACCU-CHEK GUIDE test strip USE ONE strip FOUR TIMES DAILY 100 strip 0  . albuterol (VENTOLIN HFA) 108 (90 Base) MCG/ACT inhaler Inhale 2 puffs into the lungs every 6 (six) hours as needed for wheezing or shortness of breath. 8 g 0  . atorvastatin (LIPITOR) 80 MG tablet TAKE ONE TABLET BY MOUTH ONCE DAILY AT 6pm 90 tablet 0  . LANTUS SOLOSTAR 100 UNIT/ML Solostar Pen Inject 30 Units into the skin at bedtime. 15 mL 2  . metoprolol tartrate (LOPRESSOR) 25 MG tablet TAKE ONE TABLET BY MOUTH ONCE DAILY 90 tablet 0  . nitroGLYCERIN (NITROSTAT) 0.4 MG SL tablet Place 1 tablet (0.4 mg total) under the tongue every 5 (five) minutes x 3 doses as needed for chest pain. 25 tablet 12  . NOVOLOG FLEXPEN 100 UNIT/ML FlexPen inject 15 units EVERY MORNING, 10 units AT LUNCH AND 10 units EVERY EVENING 15 mL 2  . omeprazole (PRILOSEC) 20 MG capsule TAKE ONE CAPSULE BY MOUTH ONCE DAILY AS NEEDED FOR reflux 90 capsule 0  . ramipril (ALTACE) 5 MG capsule TAKE ONE CAPSULE BY MOUTH ONCE DAILY 90 capsule 0  . UNIFINE PENTIPS 32G X 4 MM MISC AS DIRECTED FOUR TIMES DAILY 100 each 2   No current facility-administered medications on file prior to visit.   Past Medical History:  Diagnosis Date  . Coronary artery disease   . Diabetes mellitus without complication ([place])   . GERD (gastroesophageal reflux disease)  Past Surgical History:  Procedure Laterality Date  . LEFT HEART CATH AND CORONARY ANGIOGRAPHY N/A 05/30/2018   Procedure: LEFT HEART CATH AND CORONARY ANGIOGRAPHY;  Surgeon: Sherren Mocha, MD;  Location: [facility];  Service: Cardiovascular;  Laterality: N/A;    Family History  Problem Relation Age of Onset  . CAD Father    Social History   Socioeconomic History  . Marital  status: Married    Spouse name: Not on file  . Number of children: Not on file  . Years of education: Not on file  . Highest education level: Not on file  Occupational History  . Not on file  Tobacco Use  . Smoking status: Current Every Day Smoker    Packs/day: 1.00    Types: Cigarettes  . Smokeless tobacco: Never Used  Substance and Sexual Activity  . Alcohol use: Not Currently  . Drug use: Never  . Sexual activity: Yes    Partners: Female  Other Topics Concern  . Not on file  Social History Narrative  . Not on file   Social Determinants of Health   Financial Resource Strain: Not on file  Food Insecurity: Not on file  Transportation Needs: Not on file  Physical Activity: Not on file  Stress: Not on file  Social Connections: Not on file    CONSTITUTIONAL: Negative for chills, fatigue, fever, unintentional weight gain and unintentional weight loss.  E/N/T: Negative for ear pain, nasal congestion and sore throat.  CARDIOVASCULAR: Negative for chest pain, dizziness, palpitations and pedal edema.  RESPIRATORY: Negative for recent cough and dyspnea.  GASTROINTESTINAL: Negative for abdominal pain, acid reflux symptoms, constipation, diarrhea, nausea and vomiting.  MSK: see HPI INTEGUMENTARY: Negative for rash.  NEUROLOGICAL: Negative for dizziness and headaches.  PSYCHIATRIC: Negative for sleep disturbance and to question depression screen.  Negative for depression, negative for anhedonia.       Objective:  BP 118/68 (BP Location: Left Arm, Patient Position: Sitting, Cuff Size: Normal)   Pulse 67   Temp (!) 97.5 F (36.4 C) (Temporal)   Ht 5\' 10"  (1.778 m)   Wt 188 lb (85.3 kg)   SpO2 97%   BMI 26.98 kg/m   BP/Weight 09/10/2020 08/26/2020 06/12/5425  Systolic BP 062 376 283  Diastolic BP 68 81 74  Wt. (Lbs) 188 - 192  BMI 26.98 - 27.55               Lab Results  Component Value Date   WBC 13.0 (H) 06/10/2020   HGB 13.7 06/10/2020   HCT 39.1 06/10/2020    PLT 338 06/10/2020   GLUCOSE 117 (H) 06/10/2020   CHOL 131 06/10/2020   TRIG 81 06/10/2020   HDL 49 06/10/2020   LDLCALC 66 06/10/2020   ALT 18 06/10/2020   AST 20 06/10/2020   NA 125 (L) 06/10/2020   K 4.8 06/11/2020   CL 92 (L) 06/10/2020   CREATININE 1.04 06/10/2020   BUN 10 06/10/2020   CO2 19 (L) 06/10/2020   TSH 1.570 08/03/2019   INR 1.01 05/28/2018   HGBA1C 7.7 (H) 06/10/2020   MICROALBUR neg 08/03/2019   PHYSICAL EXAM:   VS: BP 118/68 (BP Location: Left Arm, Patient Position: Sitting, Cuff Size: Normal)   Pulse 67   Temp (!) 97.5 F (36.4 C) (Temporal)   Ht 5\' 10"  (1.778 m)   Wt 188 lb (85.3 kg)   SpO2 97%   BMI 26.98 kg/m  PHYSICAL EXAM:   VS: BP 118/68 (  BP Location: Left Arm, Patient Position: Sitting, Cuff Size: Normal)   Pulse 67   Temp (!) 97.5 F (36.4 C) (Temporal)   Ht 5\' 10"  (1.778 m)   Wt 188 lb (85.3 kg)   SpO2 97%   BMI 26.98 kg/m   GEN: Well nourished, well developed, in no acute distress  Cardiac: RRR; no murmurs, rubs, or gallops,no edema -  Respiratory:  normal respiratory rate and pattern with no distress - normal breath sounds with no rales, rhonchi, wheezes or rubs MS: no deformity or atrophy  Skin: warm and dry, no rash  Neuro:  Alert and Oriented x 3, Strength and sensation are intact - CN II-Xii grossly intact Psych: euthymic mood, appropriate affect and demeanor   Assessment & Plan:   1. Essential hypertension, benign - CBC with Differential/Platelet - Comprehensive metabolic panel Continue meds  2. Type 2 diabetes mellitus without complication, with long-term current use of insulin (HCC) - Hemoglobin A1c Continue meds and watch diet 3. Mixed hyperlipidemia - Lipid panel 4. Peripheral neuropathy Continue gabapentin as directed  5. GERD Continue prilosec  Meds ordered this encounter  Medications  . gabapentin (NEURONTIN) 300 MG capsule    Sig: Take 1 capsule (300 mg total) by mouth 3 (three) times daily.     Dispense:  90 capsule    Refill:  2    Order Specific Question:   Supervising Provider    AnswerShelton Silvas    Orders Placed This Encounter  Procedures  . CBC with Differential/Platelet  . Comprehensive metabolic panel  . Hemoglobin A1c  . Lipid panel     Follow-up: Return in about 3 months (around 12/11/2020) for chronic fasting.  An After Visit Summary was printed and given to the patient.

## 2020-09-11 LAB — LIPID PANEL
Chol/HDL Ratio: 2.5 ratio (ref 0.0–5.0)
Cholesterol, Total: 130 mg/dL (ref 100–199)
HDL: 52 mg/dL (ref >39)
LDL Chol Calc (NIH): 64 mg/dL (ref 0–99)
Triglycerides: 68 mg/dL (ref 0–149)
VLDL Cholesterol Cal: 14 mg/dL (ref 5–40)

## 2020-09-11 LAB — COMPREHENSIVE METABOLIC PANEL
ALT: 26 IU/L (ref 0–44)
AST: 28 IU/L (ref 0–40)
Albumin/Globulin Ratio: 1.9 (ref 1.2–2.2)
Albumin: 4.4 g/dL (ref 3.8–4.8)
Alkaline Phosphatase: 83 IU/L (ref 44–121)
BUN/Creatinine Ratio: 8 — ABNORMAL LOW (ref 10–24)
BUN: 7 mg/dL — ABNORMAL LOW (ref 8–27)
Bilirubin Total: 0.6 mg/dL (ref 0.0–1.2)
CO2: 20 mmol/L (ref 20–29)
Calcium: 9.2 mg/dL (ref 8.6–10.2)
Chloride: 99 mmol/L (ref 96–106)
Creatinine, Ser: 0.87 mg/dL (ref 0.76–1.27)
Globulin, Total: 2.3 g/dL (ref 1.5–4.5)
Glucose: 137 mg/dL — ABNORMAL HIGH (ref 65–99)
Potassium: 5.7 mmol/L — ABNORMAL HIGH (ref 3.5–5.2)
Sodium: 132 mmol/L — ABNORMAL LOW (ref 134–144)
Total Protein: 6.7 g/dL (ref 6.0–8.5)
eGFR: 98 mL/min/{1.73_m2} (ref >59)

## 2020-09-11 LAB — CBC WITH DIFFERENTIAL/PLATELET
Basophils Absolute: 0.1 10*3/uL (ref 0.0–0.2)
Basos: 1 %
EOS (ABSOLUTE): 0.3 10*3/uL (ref 0.0–0.4)
Eos: 3 %
Hematocrit: 39.9 % (ref 37.5–51.0)
Hemoglobin: 13.6 g/dL (ref 13.0–17.7)
Immature Grans (Abs): 0 10*3/uL (ref 0.0–0.1)
Immature Granulocytes: 0 %
Lymphocytes Absolute: 3.2 10*3/uL — ABNORMAL HIGH (ref 0.7–3.1)
Lymphs: 28 %
MCH: 33.6 pg — ABNORMAL HIGH (ref 26.6–33.0)
MCHC: 34.1 g/dL (ref 31.5–35.7)
MCV: 99 fL — ABNORMAL HIGH (ref 79–97)
Monocytes Absolute: 0.9 10*3/uL (ref 0.1–0.9)
Monocytes: 8 %
Neutrophils Absolute: 6.7 10*3/uL (ref 1.4–7.0)
Neutrophils: 60 %
Platelets: 340 10*3/uL (ref 150–450)
RBC: 4.05 x10E6/uL — ABNORMAL LOW (ref 4.14–5.80)
RDW: 12.3 % (ref 11.6–15.4)
WBC: 11.3 10*3/uL — ABNORMAL HIGH (ref 3.4–10.8)

## 2020-09-11 LAB — HEMOGLOBIN A1C
Est. average glucose Bld gHb Est-mCnc: 189 mg/dL
Hgb A1c MFr Bld: 8.2 % — ABNORMAL HIGH (ref 4.8–5.6)

## 2020-09-11 LAB — CARDIOVASCULAR RISK ASSESSMENT

## 2020-10-28 ENCOUNTER — Other Ambulatory Visit: Payer: Self-pay | Admitting: Physician Assistant

## 2020-11-04 ENCOUNTER — Other Ambulatory Visit: Payer: Self-pay | Admitting: Physician Assistant

## 2020-11-04 DIAGNOSIS — M48061 Spinal stenosis, lumbar region without neurogenic claudication: Secondary | ICD-10-CM

## 2020-11-04 DIAGNOSIS — M5440 Lumbago with sciatica, unspecified side: Secondary | ICD-10-CM

## 2020-11-04 DIAGNOSIS — M51369 Other intervertebral disc degeneration, lumbar region without mention of lumbar back pain or lower extremity pain: Secondary | ICD-10-CM

## 2020-11-04 DIAGNOSIS — M5136 Other intervertebral disc degeneration, lumbar region: Secondary | ICD-10-CM

## 2020-11-19 ENCOUNTER — Other Ambulatory Visit: Payer: Self-pay | Admitting: Physician Assistant

## 2020-11-19 DIAGNOSIS — G629 Polyneuropathy, unspecified: Secondary | ICD-10-CM

## 2020-12-10 ENCOUNTER — Other Ambulatory Visit: Payer: Self-pay | Admitting: Physician Assistant

## 2020-12-12 ENCOUNTER — Other Ambulatory Visit: Payer: Self-pay

## 2020-12-12 ENCOUNTER — Ambulatory Visit: Payer: Medicaid Other | Admitting: Physician Assistant

## 2020-12-12 ENCOUNTER — Encounter: Payer: Self-pay | Admitting: Physician Assistant

## 2020-12-12 VITALS — BP 112/60 | HR 69 | Temp 97.2°F | Ht 70.0 in | Wt 184.6 lb

## 2020-12-12 DIAGNOSIS — Z125 Encounter for screening for malignant neoplasm of prostate: Secondary | ICD-10-CM | POA: Diagnosis not present

## 2020-12-12 DIAGNOSIS — E119 Type 2 diabetes mellitus without complications: Secondary | ICD-10-CM | POA: Diagnosis not present

## 2020-12-12 DIAGNOSIS — E782 Mixed hyperlipidemia: Secondary | ICD-10-CM | POA: Diagnosis not present

## 2020-12-12 DIAGNOSIS — K219 Gastro-esophageal reflux disease without esophagitis: Secondary | ICD-10-CM

## 2020-12-12 DIAGNOSIS — Z794 Long term (current) use of insulin: Secondary | ICD-10-CM | POA: Diagnosis not present

## 2020-12-12 DIAGNOSIS — I10 Essential (primary) hypertension: Secondary | ICD-10-CM | POA: Diagnosis not present

## 2020-12-12 DIAGNOSIS — H6983 Other specified disorders of Eustachian tube, bilateral: Secondary | ICD-10-CM

## 2020-12-12 MED ORDER — PREDNISONE 20 MG PO TABS: ORAL_TABLET | ORAL | 0 refills | Status: DC

## 2020-12-12 MED ORDER — AMOXICILLIN 875 MG PO TABS: 875.0000 mg | ORAL_TABLET | Freq: Two times a day (BID) | ORAL | 0 refills | Status: AC

## 2020-12-12 NOTE — Progress Notes (Signed)
Subjective:  Patient ID: Gabriel Gomez, male    DOB: 09/30/1960  Age: 60 y.o. MRN: [id number]  Chief Complaint  Patient presents with   Diabetes   Hypertension   HPI  Gabriel Gomez is a 60 year old Caucasian male present for follow-up for insulin dependent diabetes, hypertension, and hyperlipidemia. He is up-to-date with colonoscopy screening, He is due for diabetic eye exam. Pt defers covid booster today  Type 2 Diabetes Mellitus with long-term insulin use Patient has been a diabetic for 26 years. He is moderately-controlled with Novolog insulin (15U Q am, 10U Q lunch, and Q dinner) and Lantus insulin (30 U QHS). He performs regular blood glucose checks daily, states they average in the mid 100s States hypoglycemia episodes are rare.  Hypertension Patient has a history of moderately-controlled hypertension. BP 112/60 Current treatment includes Ramipril '5mg'$  and Metoprolol 25 mg daily. Denies headache, dyspnea, chest pain or taking NTG. He states he has not experienced any hypotensive episodes, dizziness, or falls.  Hyperlipidemia Patient has a history of hyperlipidemia for more than 1-year. Current treatment includes Lipitor 80 mg daily. He is moderately controlled.He is not currently exercising due  To chronic back and left hip pain.   Chronic Pain Patient has chronic pain to left hip and lower back. He has a history of left total hip replacement in [place] in approximately 3 years ago. He states he has continued to have pain to left hip. MRI on 09/09/19 shows L5-S1 mild disc bulging and subarticular stenosis. He states right now he is 'managing' his pain -- would like refill of gabapentin Has seen back specialist and had nerve conduction study - told was neuropathy and not originating from spinal stenosis  Ear pain Pt complains today of both ears feeling 'stopped up' and at time ringing - this started in the past week   Current Outpatient Medications on File Prior to Visit  Medication  Sig Dispense Refill   ACCU-CHEK GUIDE test strip USE ONE strip FOUR TIMES DAILY 100 strip 0   albuterol (VENTOLIN HFA) 108 (90 Base) MCG/ACT inhaler Inhale 2 puffs into the lungs every 6 (six) hours as needed for wheezing or shortness of breath. 8 g 0   atorvastatin (LIPITOR) 80 MG tablet TAKE ONE TABLET BY MOUTH ONCE DAILY AT 6pm 90 tablet 0   gabapentin (NEURONTIN) 300 MG capsule Take 1 capsule (300 mg total) by mouth 3 (three) times daily. 90 capsule 2   LANTUS SOLOSTAR 100 UNIT/ML Solostar Pen Inject 30 Units into the skin at bedtime. 15 mL 2   metoprolol tartrate (LOPRESSOR) 25 MG tablet TAKE ONE TABLET BY MOUTH ONCE DAILY 90 tablet 0   nitroGLYCERIN (NITROSTAT) 0.4 MG SL tablet Place 1 tablet (0.4 mg total) under the tongue every 5 (five) minutes x 3 doses as needed for chest pain. 25 tablet 12   NOVOLOG FLEXPEN 100 UNIT/ML FlexPen inject 15 units EVERY MORNING, 10 units AT LUNCH AND 10 units EVERY EVENING 15 mL 2   omeprazole (PRILOSEC) 20 MG capsule TAKE ONE CAPSULE BY MOUTH ONCE DAILY AS NEEDED FOR reflux 90 capsule 0   ramipril (ALTACE) 5 MG capsule TAKE ONE CAPSULE BY MOUTH ONCE DAILY 90 capsule 0   UNIFINE PENTIPS 32G X 4 MM MISC AS DIRECTED FOUR TIMES DAILY 100 each 2   No current facility-administered medications on file prior to visit.   Past Medical History:  Diagnosis Date   Coronary artery disease    Diabetes mellitus without complication ([place])    GERD (  gastroesophageal reflux disease)    Past Surgical History:  Procedure Laterality Date   LEFT HEART CATH AND CORONARY ANGIOGRAPHY N/A 05/30/2018   Procedure: LEFT HEART CATH AND CORONARY ANGIOGRAPHY;  Surgeon: Sherren Mocha, MD;  Location: [facility];  Service: Cardiovascular;  Laterality: N/A;   PHYSICAL EXAM:   VS: BP 112/60 (BP Location: Left Arm, Patient Position: Sitting, Cuff Size: Normal)   Pulse 69   Temp (!) 97.2 F (36.2 C) (Temporal)   Ht '5\' 10"'$  (1.778 m)   Wt 184 lb 9.6 oz (83.7 kg)   SpO2 98%    BMI 26.49 kg/m   GEN: Well nourished, well developed, in no acute distress  HEENT: both TMS retracted and fluid noted Cardiac: RRR; no murmurs, rubs, or gallops, Respiratory:  normal respiratory rate and pattern with no distress - normal breath sounds with no rales, rhonchi, wheezes or rubs GI: normal bowel sounds, no masses or tenderness Neuro:  Alert and Oriented x 3, Strength and sensation are intact - CN II-Xii grossly intact Psych: euthymic mood, appropriate affect and demeanor  Family History  Problem Relation Age of Onset   CAD Father    Social History   Socioeconomic History   Marital status: Married    Spouse name: Not on file   Number of children: Not on file   Years of education: Not on file   Highest education level: Not on file  Occupational History   Not on file  Tobacco Use   Smoking status: Every Day    Packs/day: 1.00    Types: Cigarettes   Smokeless tobacco: Never  Substance and Sexual Activity   Alcohol use: Not Currently   Drug use: Never   Sexual activity: Yes    Partners: Female  Other Topics Concern   Not on file  Social History Narrative   Not on file   Social Determinants of Health   Financial Resource Strain: Not on file  Food Insecurity: Not on file  Transportation Needs: Not on file  Physical Activity: Not on file  Stress: Not on file  Social Connections: Not on file   Assessment & Plan:   1. Essential hypertension, benign - CBC with Differential/Platelet - Comprehensive metabolic panel Continue meds  2. Type 2 diabetes mellitus without complication, with long-term current use of insulin (HCC) - Hemoglobin A1c Continue meds and watch diet 3. Mixed hyperlipidemia - Lipid panel 4. Peripheral neuropathy Continue gabapentin as directed  5. GERD Continue prilosec 6 ETD Rx amoxil 875 mg bid and low short dose of prednisone as directed (monitor glucose) Meds ordered this encounter  Medications   amoxicillin (AMOXIL) 875 MG  tablet    Sig: Take 1 tablet (875 mg total) by mouth 2 (two) times daily for 10 days.    Dispense:  20 tablet    Refill:  0    Order Specific Question:   Supervising Provider    Answer:   Shelton Silvas   predniSONE (DELTASONE) 20 MG tablet    Sig: 1 po bid for 5 days    Dispense:  10 tablet    Refill:  0    Order Specific Question:   Supervising Provider    AnswerShelton Silvas    Orders Placed This Encounter  Procedures   CBC with Differential/Platelet   Comprehensive metabolic panel   TSH   Lipid panel   Hemoglobin A1c   PSA     Follow-up: Return in about 3 months (  around 03/14/2021) for chronic fasting follow up.  An After Visit Summary was printed and given to the patient.

## 2020-12-13 ENCOUNTER — Other Ambulatory Visit: Payer: Self-pay | Admitting: Physician Assistant

## 2020-12-13 ENCOUNTER — Encounter: Payer: Self-pay | Admitting: Physician Assistant

## 2020-12-13 DIAGNOSIS — R899 Unspecified abnormal finding in specimens from other organs, systems and tissues: Secondary | ICD-10-CM

## 2020-12-13 LAB — COMPREHENSIVE METABOLIC PANEL
ALT: 18 IU/L (ref 0–44)
AST: 17 IU/L (ref 0–40)
Albumin/Globulin Ratio: 2.1 (ref 1.2–2.2)
Albumin: 4.4 g/dL (ref 3.8–4.8)
Alkaline Phosphatase: 73 IU/L (ref 44–121)
BUN/Creatinine Ratio: 10 (ref 10–24)
BUN: 9 mg/dL (ref 8–27)
Bilirubin Total: 0.8 mg/dL (ref 0.0–1.2)
CO2: 21 mmol/L (ref 20–29)
Calcium: 9.4 mg/dL (ref 8.6–10.2)
Chloride: 93 mmol/L — ABNORMAL LOW (ref 96–106)
Creatinine, Ser: 0.92 mg/dL (ref 0.76–1.27)
Globulin, Total: 2.1 g/dL (ref 1.5–4.5)
Glucose: 151 mg/dL — ABNORMAL HIGH (ref 65–99)
Potassium: 6 mmol/L — ABNORMAL HIGH (ref 3.5–5.2)
Sodium: 126 mmol/L — ABNORMAL LOW (ref 134–144)
Total Protein: 6.5 g/dL (ref 6.0–8.5)
eGFR: 94 mL/min/{1.73_m2} (ref >59)

## 2020-12-13 LAB — HEMOGLOBIN A1C
Est. average glucose Bld gHb Est-mCnc: 177 mg/dL
Hgb A1c MFr Bld: 7.8 % — ABNORMAL HIGH (ref 4.8–5.6)

## 2020-12-13 LAB — LIPID PANEL
Chol/HDL Ratio: 2.2 ratio (ref 0.0–5.0)
Cholesterol, Total: 141 mg/dL (ref 100–199)
HDL: 63 mg/dL (ref >39)
LDL Chol Calc (NIH): 63 mg/dL (ref 0–99)
Triglycerides: 77 mg/dL (ref 0–149)
VLDL Cholesterol Cal: 15 mg/dL (ref 5–40)

## 2020-12-13 LAB — CBC WITH DIFFERENTIAL/PLATELET
Basophils Absolute: 0.1 10*3/uL (ref 0.0–0.2)
Basos: 1 %
EOS (ABSOLUTE): 0.2 10*3/uL (ref 0.0–0.4)
Eos: 2 %
Hematocrit: 40.4 % (ref 37.5–51.0)
Hemoglobin: 13.9 g/dL (ref 13.0–17.7)
Immature Grans (Abs): 0.1 10*3/uL (ref 0.0–0.1)
Immature Granulocytes: 1 %
Lymphocytes Absolute: 2.7 10*3/uL (ref 0.7–3.1)
Lymphs: 21 %
MCH: 34.2 pg — ABNORMAL HIGH (ref 26.6–33.0)
MCHC: 34.4 g/dL (ref 31.5–35.7)
MCV: 100 fL — ABNORMAL HIGH (ref 79–97)
Monocytes Absolute: 1.1 10*3/uL — ABNORMAL HIGH (ref 0.1–0.9)
Monocytes: 9 %
Neutrophils Absolute: 8.8 10*3/uL — ABNORMAL HIGH (ref 1.4–7.0)
Neutrophils: 66 %
Platelets: 340 10*3/uL (ref 150–450)
RBC: 4.06 x10E6/uL — ABNORMAL LOW (ref 4.14–5.80)
RDW: 12.3 % (ref 11.6–15.4)
WBC: 12.9 10*3/uL — ABNORMAL HIGH (ref 3.4–10.8)

## 2020-12-13 LAB — PSA: Prostate Specific Ag, Serum: 0.3 ng/mL (ref 0.0–4.0)

## 2020-12-13 LAB — TSH: TSH: 1.87 u[IU]/mL (ref 0.450–4.500)

## 2020-12-13 LAB — CARDIOVASCULAR RISK ASSESSMENT

## 2020-12-17 ENCOUNTER — Other Ambulatory Visit: Payer: Medicaid Other

## 2020-12-17 ENCOUNTER — Other Ambulatory Visit: Payer: Self-pay

## 2020-12-17 DIAGNOSIS — R899 Unspecified abnormal finding in specimens from other organs, systems and tissues: Secondary | ICD-10-CM

## 2020-12-18 ENCOUNTER — Other Ambulatory Visit: Payer: Self-pay | Admitting: Physician Assistant

## 2020-12-18 LAB — COMPREHENSIVE METABOLIC PANEL
ALT: 19 IU/L (ref 0–44)
AST: 19 IU/L (ref 0–40)
Albumin/Globulin Ratio: 2.2 (ref 1.2–2.2)
Albumin: 4.2 g/dL (ref 3.8–4.8)
Alkaline Phosphatase: 67 IU/L (ref 44–121)
BUN/Creatinine Ratio: 15 (ref 10–24)
BUN: 15 mg/dL (ref 8–27)
Bilirubin Total: 0.5 mg/dL (ref 0.0–1.2)
CO2: 20 mmol/L (ref 20–29)
Calcium: 9.8 mg/dL (ref 8.6–10.2)
Chloride: 94 mmol/L — ABNORMAL LOW (ref 96–106)
Creatinine, Ser: 0.99 mg/dL (ref 0.76–1.27)
Globulin, Total: 1.9 g/dL (ref 1.5–4.5)
Glucose: 113 mg/dL — ABNORMAL HIGH (ref 65–99)
Potassium: 4.6 mmol/L (ref 3.5–5.2)
Sodium: 130 mmol/L — ABNORMAL LOW (ref 134–144)
Total Protein: 6.1 g/dL (ref 6.0–8.5)
eGFR: 86 mL/min/{1.73_m2} (ref >59)

## 2020-12-18 LAB — B12 AND FOLATE PANEL
Folate: 7.1 ng/mL (ref >3.0)
Vitamin B-12: 381 pg/mL (ref 232–1245)

## 2021-01-11 ENCOUNTER — Other Ambulatory Visit: Payer: Self-pay | Admitting: Physician Assistant

## 2021-01-15 ENCOUNTER — Other Ambulatory Visit: Payer: Self-pay | Admitting: Physician Assistant

## 2021-01-15 DIAGNOSIS — J060 Acute laryngopharyngitis: Secondary | ICD-10-CM

## 2021-01-28 ENCOUNTER — Other Ambulatory Visit: Payer: Self-pay | Admitting: Physician Assistant

## 2021-02-10 ENCOUNTER — Other Ambulatory Visit: Payer: Self-pay | Admitting: Physician Assistant

## 2021-03-10 ENCOUNTER — Other Ambulatory Visit: Payer: Self-pay | Admitting: Physician Assistant

## 2021-03-10 DIAGNOSIS — J060 Acute laryngopharyngitis: Secondary | ICD-10-CM

## 2021-03-19 ENCOUNTER — Ambulatory Visit: Payer: Medicaid Other | Admitting: Physician Assistant

## 2021-04-01 ENCOUNTER — Ambulatory Visit: Payer: Medicaid Other | Admitting: Physician Assistant

## 2021-04-02 ENCOUNTER — Other Ambulatory Visit: Payer: Self-pay | Admitting: Physician Assistant

## 2021-04-07 ENCOUNTER — Telehealth: Payer: Self-pay

## 2021-04-07 NOTE — Telephone Encounter (Signed)
I left a message on the number(s) listed in the patients chart requesting the patient to call back regarding the [place] appointment for 04/11/2021. The provider is out of the office that day. The appointment has been canceled. Waiting for the patient to return the call.

## 2021-04-09 ENCOUNTER — Other Ambulatory Visit: Payer: Self-pay

## 2021-04-09 ENCOUNTER — Encounter: Payer: Self-pay | Admitting: Physician Assistant

## 2021-04-09 ENCOUNTER — Ambulatory Visit: Payer: Medicaid Other | Admitting: Physician Assistant

## 2021-04-09 VITALS — BP 136/80 | HR 59 | Temp 95.6°F | Ht 70.0 in | Wt 194.0 lb

## 2021-04-09 DIAGNOSIS — E782 Mixed hyperlipidemia: Secondary | ICD-10-CM | POA: Diagnosis not present

## 2021-04-09 DIAGNOSIS — E119 Type 2 diabetes mellitus without complications: Secondary | ICD-10-CM | POA: Diagnosis not present

## 2021-04-09 DIAGNOSIS — Z794 Long term (current) use of insulin: Secondary | ICD-10-CM

## 2021-04-09 DIAGNOSIS — G629 Polyneuropathy, unspecified: Secondary | ICD-10-CM

## 2021-04-09 DIAGNOSIS — I10 Essential (primary) hypertension: Secondary | ICD-10-CM

## 2021-04-09 NOTE — Progress Notes (Signed)
Subjective:  Patient ID: Gabriel Gomez, male    DOB: 18-Apr-1961  Age: 60 y.o. MRN: [id number]  Chief Complaint  Patient presents with   Diabetes   Hyperlipidemia   Hypertension   HPI  Gabriel Gomez is a 60 year old Caucasian male present for follow-up for insulin dependent diabetes, hypertension, and hyperlipidemia. He is up-to-date with colonoscopy screening, He is due for diabetic eye exam. Pt defers covid booster today  Type 2 Diabetes Mellitus with long-term insulin use Patient has been a diabetic for 26 years. He is moderately-controlled with Novolog insulin (15U Q am, 10U Q lunch, and Q dinner) and Lantus insulin (30 U QHS). He performs regular blood glucose checks daily, states they average in the mid 100s States hypoglycemia episodes are rare.  Hypertension Patient has a history of moderately-controlled hypertension. BP 136/80 Current treatment includes Ramipril 5mg  and Metoprolol 25 mg daily. Denies headache, dyspnea, chest pain or taking NTG. He states he has not experienced any hypotensive episodes, dizziness, or falls.  Hyperlipidemia Patient has a history of hyperlipidemia for more than 1-year. Current treatment includes Lipitor 80 mg daily. He is moderately controlled.He is not currently exercising due  To chronic back and left hip pain.   Chronic Pain Patient has chronic pain to left hip and lower back. He has a history of left total hip replacement in [place] in approximately 3 years ago. He states he has continued to have pain to left hip. MRI on 09/09/19 shows L5-S1 mild disc bulging and subarticular stenosis. He states right now he is 'managing' his pain --   CONSTITUTIONAL: Negative for chills, fatigue, fever, unintentional weight gain and unintentional weight loss.  E/N/T: Negative for ear pain, nasal congestion and sore throat.  CARDIOVASCULAR: Negative for chest pain, dizziness, palpitations and pedal edema.  RESPIRATORY: Negative for recent cough and dyspnea.   GASTROINTESTINAL: Negative for abdominal pain, acid reflux symptoms, constipation, diarrhea, nausea and vomiting.  MSK: see HPI INTEGUMENTARY: Negative for rash.  NEUROLOGICAL: Negative for dizziness and headaches.  PSYCHIATRIC: Negative for sleep disturbance and to question depression screen.  Negative for depression, negative for anhedonia.      Current Outpatient Medications on File Prior to Visit  Medication Sig Dispense Refill   ACCU-CHEK GUIDE test strip USE ONE strip FOUR TIMES DAILY 100 strip 0   atorvastatin (LIPITOR) 80 MG tablet TAKE ONE TABLET BY MOUTH ONCE DAILY AT 6pm 90 tablet 0   gabapentin (NEURONTIN) 300 MG capsule Take 1 capsule (300 mg total) by mouth 3 (three) times daily. 90 capsule 2   LANTUS SOLOSTAR 100 UNIT/ML Solostar Pen Inject 30 Units into the skin at bedtime. 15 mL 2   metoprolol tartrate (LOPRESSOR) 25 MG tablet TAKE ONE TABLET BY MOUTH ONCE DAILY 90 tablet 0   nitroGLYCERIN (NITROSTAT) 0.4 MG SL tablet Place 1 tablet (0.4 mg total) under the tongue every 5 (five) minutes x 3 doses as needed for chest pain. 25 tablet 12   NOVOLOG FLEXPEN 100 UNIT/ML FlexPen inject 15 units EVERY MORNING, 10 units AT LUNCH AND 10 units EVERY EVENING 15 mL 2   omeprazole (PRILOSEC) 20 MG capsule TAKE ONE CAPSULE BY MOUTH ONCE DAILY AS NEEDED FOR reflux 90 capsule 0   ramipril (ALTACE) 5 MG capsule TAKE ONE CAPSULE BY MOUTH ONCE DAILY 90 capsule 0   UNIFINE PENTIPS 32G X 4 MM MISC AS DIRECTED FOUR TIMES DAILY 100 each 2   VENTOLIN HFA 108 (90 Base) MCG/ACT inhaler Inhale 2 puffs into the lungs every  6 (six) hours as needed for wheezing or shortness of breath. 18 g 0   No current facility-administered medications on file prior to visit.   Past Medical History:  Diagnosis Date   Coronary artery disease    Diabetes mellitus without complication ([place])    GERD (gastroesophageal reflux disease)    Past Surgical History:  Procedure Laterality Date   LEFT HEART CATH AND CORONARY  ANGIOGRAPHY N/A 05/30/2018   Procedure: LEFT HEART CATH AND CORONARY ANGIOGRAPHY;  Surgeon: Sherren Mocha, MD;  Location: [facility];  Service: Cardiovascular;  Laterality: N/A;  PHYSICAL EXAM:   VS: BP 136/80   Pulse (!) 59   Temp (!) 95.6 F (35.3 C)   Ht 5\' 10"  (1.778 m)   Wt 194 lb (88 kg)   SpO2 100%   BMI 27.84 kg/m   GEN: Well nourished, well developed, in no acute distress  Cardiac: RRR; no murmurs, rubs, or gallops,no edema -  Respiratory:  normal respiratory rate and pattern with no distress - normal breath sounds with no rales, rhonchi, wheezes or rubs MS: no deformity or atrophy  Skin: warm and dry, no rash  Neuro:  Alert and Oriented x 3, Strength and sensation are intact - CN II-Xii grossly intact Psych: euthymic mood, appropriate affect and demeanor  Family History  Problem Relation Age of Onset   CAD Father    Social History   Socioeconomic History   Marital status: Married    Spouse name: Not on file   Number of children: Not on file   Years of education: Not on file   Highest education level: Not on file  Occupational History   Not on file  Tobacco Use   Smoking status: Every Day    Packs/day: 1.00    Types: Cigarettes   Smokeless tobacco: Never  Substance and Sexual Activity   Alcohol use: Not Currently   Drug use: Never   Sexual activity: Yes    Partners: Female  Other Topics Concern   Not on file  Social History Narrative   Not on file   Social Determinants of Health   Financial Resource Strain: Not on file  Food Insecurity: Not on file  Transportation Needs: Not on file  Physical Activity: Not on file  Stress: Not on file  Social Connections: Not on file   Assessment & Plan:   1. Essential hypertension, benign - CBC with Differential/Platelet - Comprehensive metabolic panel Continue meds  2. Type 2 diabetes mellitus without complication, with long-term current use of insulin (HCC) - Hemoglobin A1c Continue meds and  watch diet 3. Mixed hyperlipidemia - Lipid panel 4. Peripheral neuropathy Continue gabapentin as directed  5. GERD Continue prilosec    Orders Placed This Encounter  Procedures   CBC with Differential/Platelet   Comprehensive metabolic panel   Lipid panel   Hemoglobin A1c     Follow-up: Return in about 4 months (around 08/07/2021) for chronic fasting follow up.  An After Visit Summary was printed and given to the patient.

## 2021-04-10 ENCOUNTER — Ambulatory Visit: Payer: Medicaid Other | Admitting: Physician Assistant

## 2021-04-10 ENCOUNTER — Other Ambulatory Visit: Payer: Self-pay | Admitting: Physician Assistant

## 2021-04-10 DIAGNOSIS — R6889 Other general symptoms and signs: Secondary | ICD-10-CM

## 2021-04-10 LAB — CBC WITH DIFFERENTIAL/PLATELET
Basophils Absolute: 0.1 10*3/uL (ref 0.0–0.2)
Basos: 1 %
EOS (ABSOLUTE): 0.2 10*3/uL (ref 0.0–0.4)
Eos: 2 %
Hematocrit: 40.1 % (ref 37.5–51.0)
Hemoglobin: 14 g/dL (ref 13.0–17.7)
Immature Grans (Abs): 0 10*3/uL (ref 0.0–0.1)
Immature Granulocytes: 0 %
Lymphocytes Absolute: 2.6 10*3/uL (ref 0.7–3.1)
Lymphs: 27 %
MCH: 33.6 pg — ABNORMAL HIGH (ref 26.6–33.0)
MCHC: 34.9 g/dL (ref 31.5–35.7)
MCV: 96 fL (ref 79–97)
Monocytes Absolute: 1 10*3/uL — ABNORMAL HIGH (ref 0.1–0.9)
Monocytes: 10 %
Neutrophils Absolute: 5.8 10*3/uL (ref 1.4–7.0)
Neutrophils: 60 %
Platelets: 295 10*3/uL (ref 150–450)
RBC: 4.17 x10E6/uL (ref 4.14–5.80)
RDW: 11.7 % (ref 11.6–15.4)
WBC: 9.6 10*3/uL (ref 3.4–10.8)

## 2021-04-10 LAB — COMPREHENSIVE METABOLIC PANEL
ALT: 20 IU/L (ref 0–44)
AST: 27 IU/L (ref 0–40)
Albumin/Globulin Ratio: 2 (ref 1.2–2.2)
Albumin: 4.2 g/dL (ref 3.8–4.8)
Alkaline Phosphatase: 89 IU/L (ref 44–121)
BUN/Creatinine Ratio: 8 — ABNORMAL LOW (ref 10–24)
BUN: 8 mg/dL (ref 8–27)
Bilirubin Total: 0.7 mg/dL (ref 0.0–1.2)
CO2: 22 mmol/L (ref 20–29)
Calcium: 9.3 mg/dL (ref 8.6–10.2)
Chloride: 95 mmol/L — ABNORMAL LOW (ref 96–106)
Creatinine, Ser: 0.95 mg/dL (ref 0.76–1.27)
Globulin, Total: 2.1 g/dL (ref 1.5–4.5)
Glucose: 125 mg/dL — ABNORMAL HIGH (ref 70–99)
Potassium: 5.6 mmol/L — ABNORMAL HIGH (ref 3.5–5.2)
Sodium: 128 mmol/L — ABNORMAL LOW (ref 134–144)
Total Protein: 6.3 g/dL (ref 6.0–8.5)
eGFR: 90 mL/min/{1.73_m2} (ref >59)

## 2021-04-10 LAB — LIPID PANEL
Chol/HDL Ratio: 2.3 ratio (ref 0.0–5.0)
Cholesterol, Total: 161 mg/dL (ref 100–199)
HDL: 69 mg/dL (ref >39)
LDL Chol Calc (NIH): 76 mg/dL (ref 0–99)
Triglycerides: 85 mg/dL (ref 0–149)
VLDL Cholesterol Cal: 16 mg/dL (ref 5–40)

## 2021-04-10 LAB — HEMOGLOBIN A1C
Est. average glucose Bld gHb Est-mCnc: 174 mg/dL
Hgb A1c MFr Bld: 7.7 % — ABNORMAL HIGH (ref 4.8–5.6)

## 2021-04-10 LAB — CARDIOVASCULAR RISK ASSESSMENT

## 2021-04-11 ENCOUNTER — Ambulatory Visit: Payer: Medicaid Other | Admitting: Physician Assistant

## 2021-04-21 ENCOUNTER — Other Ambulatory Visit: Payer: Medicaid Other

## 2021-04-23 ENCOUNTER — Other Ambulatory Visit: Payer: Self-pay | Admitting: Family Medicine

## 2021-05-07 ENCOUNTER — Encounter: Payer: Self-pay | Admitting: Legal Medicine

## 2021-05-07 ENCOUNTER — Ambulatory Visit: Payer: Medicaid Other | Admitting: Legal Medicine

## 2021-05-07 VITALS — BP 120/60 | HR 64 | Temp 97.6°F | Resp 17 | Ht 70.0 in | Wt 186.0 lb

## 2021-05-07 DIAGNOSIS — J0100 Acute maxillary sinusitis, unspecified: Secondary | ICD-10-CM | POA: Diagnosis not present

## 2021-05-07 DIAGNOSIS — R0602 Shortness of breath: Secondary | ICD-10-CM | POA: Diagnosis not present

## 2021-05-07 DIAGNOSIS — E1142 Type 2 diabetes mellitus with diabetic polyneuropathy: Secondary | ICD-10-CM

## 2021-05-07 LAB — POCT INFLUENZA A/B
Influenza A, POC: NEGATIVE
Influenza B, POC: NEGATIVE

## 2021-05-07 LAB — POC COVID19 BINAXNOW: SARS Coronavirus 2 Ag: NEGATIVE

## 2021-05-07 MED ORDER — AZITHROMYCIN 250 MG PO TABS: ORAL_TABLET | ORAL | 0 refills | Status: AC

## 2021-05-07 MED ORDER — PREGABALIN 75 MG PO CAPS
75.0000 mg | ORAL_CAPSULE | Freq: Two times a day (BID) | ORAL | 3 refills | Status: DC
Start: 2021-05-07 — End: 2021-09-03

## 2021-05-07 MED ORDER — CHERATUSSIN AC 100-10 MG/5ML PO SOLN: 5.0000 mL | Freq: Three times a day (TID) | ORAL | 0 refills | Status: DC | PRN

## 2021-05-07 NOTE — Progress Notes (Signed)
Acute Office Visit  Subjective:    Patient ID: Gabriel Gomez, male    DOB: [birth year], 60 y.o.   MRN: [id number]  Chief Complaint  Patient presents with   Cough   Shortness of Breath    HPI: Patient is in today for cough and congestion for 2 days.  No fever or chills. Negative Covid and flu.  He is grieving for brother who died.  Past Medical History:  Diagnosis Date   Coronary artery disease    Diabetes mellitus without complication (HCC)    GERD (gastroesophageal reflux disease)     Past Surgical History:  Procedure Laterality Date   LEFT HEART CATH AND CORONARY ANGIOGRAPHY N/A 05/30/2018   Procedure: LEFT HEART CATH AND CORONARY ANGIOGRAPHY;  Surgeon: Tonny Bollman, MD;  Location: [facility];  Service: Cardiovascular;  Laterality: N/A;    Family History  Problem Relation Age of Onset   CAD Father     Social History   Socioeconomic History   Marital status: Married    Spouse name: Not on file   Number of children: Not on file   Years of education: Not on file   Highest education level: Not on file  Occupational History   Not on file  Tobacco Use   Smoking status: Every Day    Packs/day: 1.00    Types: Cigarettes   Smokeless tobacco: Never  Substance and Sexual Activity   Alcohol use: Not Currently   Drug use: Never   Sexual activity: Yes    Partners: Female  Other Topics Concern   Not on file  Social History Narrative   Not on file   Social Determinants of Health   Financial Resource Strain: Not on file  Food Insecurity: Not on file  Transportation Needs: Not on file  Physical Activity: Not on file  Stress: Not on file  Social Connections: Not on file  Intimate Partner Violence: Not on file    Outpatient Medications Prior to Visit  Medication Sig Dispense Refill   ACCU-CHEK GUIDE test strip USE ONE strip FOUR TIMES DAILY 100 strip 0   atorvastatin (LIPITOR) 80 MG tablet TAKE ONE TABLET BY MOUTH ONCE DAILY AT 6pm 90 tablet 0    LANTUS SOLOSTAR 100 UNIT/ML Solostar Pen Inject 30 Units into the skin at bedtime. 15 mL 2   metoprolol tartrate (LOPRESSOR) 25 MG tablet TAKE ONE TABLET BY MOUTH ONCE DAILY 90 tablet 0   nitroGLYCERIN (NITROSTAT) 0.4 MG SL tablet Place 1 tablet (0.4 mg total) under the tongue every 5 (five) minutes x 3 doses as needed for chest pain. 25 tablet 12   NOVOLOG FLEXPEN 100 UNIT/ML FlexPen inject 15 units EVERY MORNING, 10 units AT LUNCH AND 10 units EVERY EVENING 15 mL 2   omeprazole (PRILOSEC) 20 MG capsule TAKE ONE CAPSULE BY MOUTH ONCE DAILY AS NEEDED FOR reflux 90 capsule 0   ramipril (ALTACE) 5 MG capsule TAKE ONE CAPSULE BY MOUTH ONCE DAILY 90 capsule 0   UNIFINE PENTIPS 32G X 4 MM MISC AS DIRECTED FOUR TIMES DAILY 100 each 2   VENTOLIN HFA 108 (90 Base) MCG/ACT inhaler Inhale 2 puffs into the lungs every 6 (six) hours as needed for wheezing or shortness of breath. 18 g 0   gabapentin (NEURONTIN) 300 MG capsule Take 1 capsule (300 mg total) by mouth 3 (three) times daily. 90 capsule 2   No facility-administered medications prior to visit.    No Known Allergies  Review of Systems  Constitutional:  Positive for chills and fatigue. Negative for fever and unexpected weight change.  HENT:  Positive for congestion, ear pain, sinus pressure and sinus pain. Negative for sore throat.   Respiratory:  Positive for cough, shortness of breath and wheezing.   Cardiovascular:  Negative for chest pain and palpitations.  Gastrointestinal:  Negative for abdominal pain, blood in stool, constipation, diarrhea, nausea and vomiting.  Endocrine: Negative for polydipsia.  Genitourinary:  Negative for dysuria.  Musculoskeletal:  Negative for back pain.  Skin:  Negative for rash.  Neurological:  Positive for headaches.  Psychiatric/Behavioral:  Positive for dysphoric mood.       Objective:    Physical Exam Vitals reviewed.  Constitutional:      General: He is in acute distress.     Appearance: Normal  appearance.  HENT:     Nose: Congestion present.     Mouth/Throat:     Mouth: Mucous membranes are moist.     Pharynx: Oropharynx is clear.  Eyes:     Extraocular Movements: Extraocular movements intact.     Conjunctiva/sclera: Conjunctivae normal.     Pupils: Pupils are equal, round, and reactive to light.  Cardiovascular:     Rate and Rhythm: Normal rate and regular rhythm.     Pulses: Normal pulses.     Heart sounds: No murmur heard.   No gallop.  Pulmonary:     Effort: Pulmonary effort is normal.     Breath sounds: Rhonchi present.  Abdominal:     General: Bowel sounds are normal.     Palpations: Abdomen is soft.  Musculoskeletal:     Cervical back: Normal range of motion and neck supple.  Skin:    General: Skin is warm.     Capillary Refill: Capillary refill takes less than 2 seconds.  Neurological:     General: No focal deficit present.     Mental Status: He is alert and oriented to person, place, and time.     Sensory: Sensory deficit present.    BP 120/60    Pulse 64    Temp 97.6 F (36.4 C)    Resp 17    Ht $R'5\' 10"'UN$  (1.778 m)    Wt 186 lb (84.4 kg)    SpO2 97%    BMI 26.69 kg/m  Wt Readings from Last 3 Encounters:  05/07/21 186 lb (84.4 kg)  04/09/21 194 lb (88 kg)  12/12/20 184 lb 9.6 oz (83.7 kg)         Lab Results  Component Value Date   TSH 1.870 12/12/2020   Lab Results  Component Value Date   WBC 9.6 04/09/2021   HGB 14.0 04/09/2021   HCT 40.1 04/09/2021   MCV 96 04/09/2021   PLT 295 04/09/2021   Lab Results  Component Value Date   NA 128 (L) 04/09/2021   K 5.6 (H) 04/09/2021   CO2 22 04/09/2021   GLUCOSE 125 (H) 04/09/2021   BUN 8 04/09/2021   CREATININE 0.95 04/09/2021   BILITOT 0.7 04/09/2021   ALKPHOS 89 04/09/2021   AST 27 04/09/2021   ALT 20 04/09/2021   PROT 6.3 04/09/2021   ALBUMIN 4.2 04/09/2021   CALCIUM 9.3 04/09/2021   ANIONGAP 9 05/30/2018   EGFR 90 04/09/2021   Lab Results  Component Value Date   CHOL 161  04/09/2021   Lab Results  Component Value Date   HDL 69 04/09/2021   Lab Results  Component Value Date   LDLCALC 76  04/09/2021   Lab Results  Component Value Date   TRIG 85 04/09/2021   Lab Results  Component Value Date   CHOLHDL 2.3 04/09/2021   Lab Results  Component Value Date   HGBA1C 7.7 (H) 04/09/2021       Assessment & Plan:   Diagnoses and all orders for this visit: SOB (shortness of breath) -     POC COVID-19- negative -     Influenza A/B- negative -     guaiFENesin-codeine (CHERATUSSIN AC) 100-10 MG/5ML syrup; Take 5 mLs by mouth 3 (three) times daily as needed.  Acute non-recurrent maxillary sinusitis -     azithromycin (ZITHROMAX) 250 MG tablet; Take 2 tablets on day 1, then 1 tablet daily on days 2 through 5 Treat sinusitis with Zpack Diabetic polyneuropathy associated with type 2 diabetes mellitus ([place]) -     pregabalin (LYRICA) 75 MG capsule; Take 1 capsule (75 mg total) by mouth 2 (two) times daily. Try lyrica for polyneuropathy, stop gabapentin         Follow-up: Return if symptoms worsen or fail to improve.  An After Visit Summary was printed and given to the patient.  Reinaldo Meeker, MD [facility] [phone]

## 2021-05-12 ENCOUNTER — Other Ambulatory Visit: Payer: Self-pay | Admitting: Physician Assistant

## 2021-05-15 ENCOUNTER — Telehealth: Payer: Self-pay

## 2021-05-15 NOTE — Telephone Encounter (Signed)
Daughter calling pt is continuing to not feel the best and ears are clogged. He has improved but does not feel himself.   Butch Penny [place]: [phone] Please advise.   Royce Macadamia, [place] 05/15/21 9:03 AM

## 2021-05-15 NOTE — Telephone Encounter (Signed)
Use flonase regularly lp

## 2021-05-15 NOTE — Telephone Encounter (Signed)
Daughter called back. She will make pt aware.   Gabriel Gomez, [place] 05/15/21 10:23 AM

## 2021-05-24 ENCOUNTER — Other Ambulatory Visit: Payer: Self-pay | Admitting: Physician Assistant

## 2021-05-27 ENCOUNTER — Other Ambulatory Visit: Payer: Self-pay | Admitting: Physician Assistant

## 2021-05-27 DIAGNOSIS — J060 Acute laryngopharyngitis: Secondary | ICD-10-CM

## 2021-05-30 ENCOUNTER — Other Ambulatory Visit: Payer: Self-pay | Admitting: Physician Assistant

## 2021-05-30 DIAGNOSIS — Z122 Encounter for screening for malignant neoplasm of respiratory organs: Secondary | ICD-10-CM

## 2021-06-02 ENCOUNTER — Telehealth: Payer: Self-pay | Admitting: Physician Assistant

## 2021-06-02 NOTE — Telephone Encounter (Signed)
° °  Gabriel Gomez has been scheduled for the following appointment:  WHAT: CT LUNG SCREEN WHERE: [facility] DATE: 06/09/21 TIME: 10:15 AM ARRIVAL TIME  Patient has been made aware.

## 2021-06-09 DIAGNOSIS — Z87891 Personal history of nicotine dependence: Secondary | ICD-10-CM | POA: Diagnosis not present

## 2021-06-10 ENCOUNTER — Other Ambulatory Visit: Payer: Self-pay | Admitting: Physician Assistant

## 2021-06-10 DIAGNOSIS — I359 Nonrheumatic aortic valve disorder, unspecified: Secondary | ICD-10-CM

## 2021-06-16 ENCOUNTER — Other Ambulatory Visit: Payer: Self-pay | Admitting: Physician Assistant

## 2021-06-16 ENCOUNTER — Telehealth: Payer: Self-pay

## 2021-06-16 MED ORDER — ACCU-CHEK GUIDE W/DEVICE KIT: PACK | 0 refills | Status: AC

## 2021-06-16 NOTE — Telephone Encounter (Signed)
Requesting new glucometer sent to [facility]. His is no longer working.   Brand: Accu-Chek Guide Pharmacy: [facility] location   [place], [place] 06/16/21 2:42 PM

## 2021-06-19 ENCOUNTER — Ambulatory Visit (INDEPENDENT_AMBULATORY_CARE_PROVIDER_SITE_OTHER): Payer: Medicaid Other

## 2021-06-19 ENCOUNTER — Other Ambulatory Visit: Payer: Self-pay

## 2021-06-19 ENCOUNTER — Other Ambulatory Visit: Payer: Self-pay | Admitting: Physician Assistant

## 2021-06-19 DIAGNOSIS — I5030 Unspecified diastolic (congestive) heart failure: Secondary | ICD-10-CM

## 2021-06-19 DIAGNOSIS — I358 Other nonrheumatic aortic valve disorders: Secondary | ICD-10-CM

## 2021-06-19 DIAGNOSIS — I359 Nonrheumatic aortic valve disorder, unspecified: Secondary | ICD-10-CM

## 2021-06-19 LAB — ECHOCARDIOGRAM COMPLETE
AV Mean grad: 6 mmHg
AV Peak grad: 9.7 mmHg
Ao pk vel: 1.56 m/s
Area-P 1/2: 3.27 cm2
S' Lateral: 3.1 cm

## 2021-06-25 ENCOUNTER — Other Ambulatory Visit: Payer: Self-pay | Admitting: Physician Assistant

## 2021-06-25 MED ORDER — ACCU-CHEK SOFTCLIX LANCETS MISC: 12 refills | Status: AC

## 2021-07-16 ENCOUNTER — Telehealth: Payer: Self-pay

## 2021-07-16 NOTE — Telephone Encounter (Signed)
Spoke with pharmacist at [facility] and gave a verbal order to switch Lantus to glargine per Gay Filler. ?

## 2021-07-16 NOTE — Telephone Encounter (Signed)
PRIOR AUTH WAS DONE THROUGH PATIENT'S INSURANCE FOR LANTUS AND WAS APPROVED FOR 1 YEAR. ?

## 2021-07-26 ENCOUNTER — Other Ambulatory Visit: Payer: Self-pay | Admitting: Physician Assistant

## 2021-07-26 DIAGNOSIS — J060 Acute laryngopharyngitis: Secondary | ICD-10-CM

## 2021-08-01 ENCOUNTER — Other Ambulatory Visit: Payer: Self-pay

## 2021-08-01 ENCOUNTER — Encounter: Payer: Self-pay | Admitting: Physician Assistant

## 2021-08-01 ENCOUNTER — Other Ambulatory Visit: Payer: Self-pay | Admitting: Nurse Practitioner

## 2021-08-01 ENCOUNTER — Ambulatory Visit: Payer: Medicaid Other | Admitting: Physician Assistant

## 2021-08-01 ENCOUNTER — Ambulatory Visit (HOSPITAL_COMMUNITY)
Admission: RE | Admit: 2021-08-01 | Discharge: 2021-08-01 | Disposition: A | Discharge status: Home / Self Care | Payer: Medicaid Other | Source: Ambulatory Visit | Attending: Physician Assistant | Admitting: Physician Assistant

## 2021-08-01 VITALS — BP 150/68 | HR 80 | Temp 97.3°F | Resp 18 | Ht 68.0 in | Wt 195.0 lb

## 2021-08-01 DIAGNOSIS — M7989 Other specified soft tissue disorders: Secondary | ICD-10-CM

## 2021-08-01 DIAGNOSIS — M79604 Pain in right leg: Secondary | ICD-10-CM | POA: Insufficient documentation

## 2021-08-01 DIAGNOSIS — R600 Localized edema: Secondary | ICD-10-CM | POA: Diagnosis not present

## 2021-08-01 DIAGNOSIS — D649 Anemia, unspecified: Secondary | ICD-10-CM | POA: Diagnosis not present

## 2021-08-01 LAB — CBC WITH DIFFERENTIAL/PLATELET
Basophils Absolute: 0.1 10*3/uL (ref 0.0–0.2)
Basos: 1 %
EOS (ABSOLUTE): 0.3 10*3/uL (ref 0.0–0.4)
Eos: 3 %
Hematocrit: 34.2 % — ABNORMAL LOW (ref 37.5–51.0)
Hemoglobin: 12 g/dL — ABNORMAL LOW (ref 13.0–17.7)
Immature Grans (Abs): 0 10*3/uL (ref 0.0–0.1)
Immature Granulocytes: 1 %
Lymphocytes Absolute: 2.1 10*3/uL (ref 0.7–3.1)
Lymphs: 26 %
MCH: 33.5 pg — ABNORMAL HIGH (ref 26.6–33.0)
MCHC: 35.1 g/dL (ref 31.5–35.7)
MCV: 96 fL (ref 79–97)
Monocytes Absolute: 0.8 10*3/uL (ref 0.1–0.9)
Monocytes: 10 %
Neutrophils Absolute: 4.8 10*3/uL (ref 1.4–7.0)
Neutrophils: 59 %
Platelets: 322 10*3/uL (ref 150–450)
RBC: 3.58 x10E6/uL — ABNORMAL LOW (ref 4.14–5.80)
RDW: 11.9 % (ref 11.6–15.4)
WBC: 8 10*3/uL (ref 3.4–10.8)

## 2021-08-01 LAB — COMPREHENSIVE METABOLIC PANEL
ALT: 15 IU/L (ref 0–44)
AST: 21 IU/L (ref 0–40)
Albumin/Globulin Ratio: 1.9 (ref 1.2–2.2)
Albumin: 3.6 g/dL — ABNORMAL LOW (ref 3.8–4.8)
Alkaline Phosphatase: 83 IU/L (ref 44–121)
BUN/Creatinine Ratio: 9 — ABNORMAL LOW (ref 10–24)
BUN: 8 mg/dL (ref 8–27)
Bilirubin Total: 0.5 mg/dL (ref 0.0–1.2)
CO2: 22 mmol/L (ref 20–29)
Calcium: 8.7 mg/dL (ref 8.6–10.2)
Chloride: 92 mmol/L — ABNORMAL LOW (ref 96–106)
Creatinine, Ser: 0.9 mg/dL (ref 0.76–1.27)
Globulin, Total: 1.9 g/dL (ref 1.5–4.5)
Glucose: 272 mg/dL — ABNORMAL HIGH (ref 70–99)
Potassium: 5.3 mmol/L — ABNORMAL HIGH (ref 3.5–5.2)
Sodium: 125 mmol/L — ABNORMAL LOW (ref 134–144)
Total Protein: 5.5 g/dL — ABNORMAL LOW (ref 6.0–8.5)
eGFR: 96 mL/min/{1.73_m2} (ref >59)

## 2021-08-01 MED ORDER — FUROSEMIDE 20 MG PO TABS: 20.0000 mg | ORAL_TABLET | Freq: Every day | ORAL | 3 refills | Status: DC

## 2021-08-01 NOTE — Progress Notes (Signed)
? ?Acute Office Visit ? ?Subjective:  ? ? Patient ID: Gabriel Gomez, male    DOB: 1960/10/04, 61 y.o.   MRN: [id number] ? ?Chief Complaint  ?Patient presents with  ? Leg Pain  ?  Right lower leg   ? ? ?HPI: ?Patient is in today for right lower leg pain - -pt states that on Wednesday morning his right lower leg became tender and sore - since that time it has swollen and the pain is worsening.  He has no warmth or redness to leg ?Denies chest pain or shortness of breath ?No recent travel or surgery ? ?Past Medical History:  ?Diagnosis Date  ? Coronary artery disease   ? Diabetes mellitus without complication ([place])   ? GERD (gastroesophageal reflux disease)   ? ? ?Past Surgical History:  ?Procedure Laterality Date  ? LEFT HEART CATH AND CORONARY ANGIOGRAPHY N/A 05/30/2018  ? Procedure: LEFT HEART CATH AND CORONARY ANGIOGRAPHY;  Surgeon: Sherren Mocha, MD;  Location: [facility];  Service: Cardiovascular;  Laterality: N/A;  ? ? ?Family History  ?Problem Relation Age of Onset  ? CAD Father   ? ? ?Social History  ? ?Socioeconomic History  ? Marital status: Married  ?  Spouse name: Not on file  ? Number of children: Not on file  ? Years of education: Not on file  ? Highest education level: Not on file  ?Occupational History  ? Not on file  ?Tobacco Use  ? Smoking status: Every Day  ?  Packs/day: 1.00  ?  Types: Cigarettes  ? Smokeless tobacco: Never  ?Substance and Sexual Activity  ? Alcohol use: Not Currently  ? Drug use: Never  ? Sexual activity: Yes  ?  Partners: Female  ?Other Topics Concern  ? Not on file  ?Social History Narrative  ? Not on file  ? ?Social Determinants of Health  ? ?Financial Resource Strain: Not on file  ?Food Insecurity: Not on file  ?Transportation Needs: Not on file  ?Physical Activity: Not on file  ?Stress: Not on file  ?Social Connections: Not on file  ?Intimate Partner Violence: Not on file  ? ? ?Outpatient Medications Prior to Visit  ?Medication Sig Dispense Refill  ? ACCU-CHEK GUIDE  test strip USE ONE strip FOUR TIMES DAILY 100 strip 0  ? Accu-Chek Softclix Lancets lancets Use as instructed 100 each 12  ? atorvastatin (LIPITOR) 80 MG tablet TAKE ONE TABLET BY MOUTH ONCE DAILY AT 6pm 90 tablet 0  ? Blood Glucose Monitoring Suppl (ACCU-CHEK GUIDE) w/Device KIT To use to check glucose qd 1 kit 0  ? LANTUS SOLOSTAR 100 UNIT/ML Solostar Pen Inject 30 Units into the skin at bedtime. 15 mL 2  ? metoprolol tartrate (LOPRESSOR) 25 MG tablet TAKE ONE TABLET BY MOUTH ONCE DAILY 90 tablet 0  ? nitroGLYCERIN (NITROSTAT) 0.4 MG SL tablet Place 1 tablet (0.4 mg total) under the tongue every 5 (five) minutes x 3 doses as needed for chest pain. 25 tablet 12  ? NOVOLOG FLEXPEN 100 UNIT/ML FlexPen inject 15 units EVERY MORNING, 10 units AT LUNCH AND 10 units EVERY EVENING 15 mL 2  ? omeprazole (PRILOSEC) 20 MG capsule TAKE ONE CAPSULE BY MOUTH ONCE DAILY AS NEEDED FOR reflux 90 capsule 0  ? pregabalin (LYRICA) 75 MG capsule Take 1 capsule (75 mg total) by mouth 2 (two) times daily. 60 capsule 3  ? ramipril (ALTACE) 5 MG capsule TAKE ONE CAPSULE BY MOUTH ONCE DAILY 90 capsule 0  ?  UNIFINE PENTIPS 32G X 4 MM MISC AS DIRECTED FOUR TIMES DAILY 100 each 2  ? VENTOLIN HFA 108 (90 Base) MCG/ACT inhaler Inhale 2 puffs into the lungs every 6 (six) hours as needed for wheezing or shortness of breath. 18 g 0  ? guaiFENesin-codeine (CHERATUSSIN AC) 100-10 MG/5ML syrup Take 5 mLs by mouth 3 (three) times daily as needed. 120 mL 0  ? ?No facility-administered medications prior to visit.  ? ? ?No Known Allergies ? ?Review of Systems ?CONSTITUTIONAL: Negative for chills, fatigue, fever, unintentional weight gain and unintentional weight loss.  ? ?CARDIOVASCULAR: Negative for chest pain, dizziness, palpitations  ?RESPIRATORY: Negative for recent cough and dyspnea.  ?MSK:see HPI ?INTEGUMENTARY: Negative for rash.  ? ?   ? ?   ?Objective:  ?  ?Physical Exam ?PHYSICAL EXAM:  ? ?VS: BP (!) 150/68   Pulse 80   Temp (!) 97.3 ?F (36.3  ?C)   Resp 18   Ht 5' 8" (1.727 m)   Wt 195 lb (88.5 kg)   BMI 29.65 kg/m?  ? ?GEN: Well nourished, well developed, in no acute distress  ?Cardiac: RRR; no murmurs, rubs,- right lower extremity with edema - no erythema ?Respiratory:  normal respiratory rate and pattern with no distress - normal breath sounds with no rales, rhonchi, wheezes or rubs ?MS: right leg swollen from calf to foot - Homens sign positive ?Skin: warm and dry, no rash  ? ? ?BP (!) 150/68   Pulse 80   Temp (!) 97.3 ?F (36.3 ?C)   Resp 18   Ht 5' 8" (1.727 m)   Wt 195 lb (88.5 kg)   BMI 29.65 kg/m?  ?Wt Readings from Last 3 Encounters:  ?08/01/21 195 lb (88.5 kg)  ?05/07/21 186 lb (84.4 kg)  ?04/09/21 194 lb (88 kg)  ? ? ?Health Maintenance Due  ?Topic Date Due  ? OPHTHALMOLOGY EXAM  05/16/2021  ? ? ?There are no preventive care reminders to display for this patient. ? ? ?Lab Results  ?Component Value Date  ? TSH 1.870 12/12/2020  ? ?Lab Results  ?Component Value Date  ? WBC 9.6 04/09/2021  ? HGB 14.0 04/09/2021  ? HCT 40.1 04/09/2021  ? MCV 96 04/09/2021  ? PLT 295 04/09/2021  ? ?Lab Results  ?Component Value Date  ? NA 128 (L) 04/09/2021  ? K 5.6 (H) 04/09/2021  ? CO2 22 04/09/2021  ? GLUCOSE 125 (H) 04/09/2021  ? BUN 8 04/09/2021  ? CREATININE 0.95 04/09/2021  ? BILITOT 0.7 04/09/2021  ? ALKPHOS 89 04/09/2021  ? AST 27 04/09/2021  ? ALT 20 04/09/2021  ? PROT 6.3 04/09/2021  ? ALBUMIN 4.2 04/09/2021  ? CALCIUM 9.3 04/09/2021  ? ANIONGAP 9 05/30/2018  ? EGFR 90 04/09/2021  ? ?Lab Results  ?Component Value Date  ? CHOL 161 04/09/2021  ? ?Lab Results  ?Component Value Date  ? HDL 69 04/09/2021  ? ?Lab Results  ?Component Value Date  ? [place] 76 04/09/2021  ? ?Lab Results  ?Component Value Date  ? TRIG 85 04/09/2021  ? ?Lab Results  ?Component Value Date  ? CHOLHDL 2.3 04/09/2021  ? ?Lab Results  ?Component Value Date  ? HGBA1C 7.7 (H) 04/09/2021  ? ? ?   ?Assessment & Plan:  ? ?Problem List Items Addressed This Visit   ?None ?Visit  Diagnoses   ? ? Leg edema, right    -  Primary  ? Relevant Orders  ? VAS [place] LOWER EXTREMITY VENOUS (DVT)  ?  Right leg pain      ? Relevant Orders  ? CBC with Differential/Platelet  ? Comprehensive metabolic panel  ? VAS [place] LOWER EXTREMITY VENOUS (DVT)  ? ?  ? ?No orders of the defined types were placed in this encounter. ? ? ?Orders Placed This Encounter  ?Procedures  ? CBC with Differential/Platelet  ? Comprehensive metabolic panel  ? VAS [place] LOWER EXTREMITY VENOUS (DVT)  ?  ? ?Follow-up: Return in about 1 week (around 08/08/2021) for follow up or sooner if any symptoms change or worsen. ? ?An After Visit Summary was printed and given to the patient. ? ?SARA R DAVIS, PA-C ?[place] ?[phone]

## 2021-08-04 DIAGNOSIS — E871 Hypo-osmolality and hyponatremia: Secondary | ICD-10-CM | POA: Diagnosis not present

## 2021-08-04 DIAGNOSIS — E1142 Type 2 diabetes mellitus with diabetic polyneuropathy: Secondary | ICD-10-CM | POA: Diagnosis not present

## 2021-08-04 DIAGNOSIS — M25471 Effusion, right ankle: Secondary | ICD-10-CM | POA: Diagnosis not present

## 2021-08-04 DIAGNOSIS — Z794 Long term (current) use of insulin: Secondary | ICD-10-CM | POA: Diagnosis not present

## 2021-08-06 LAB — B12 AND FOLATE PANEL
Folate: 7.6 ng/mL (ref >3.0)
Vitamin B-12: 373 pg/mL (ref 232–1245)

## 2021-08-06 LAB — SPECIMEN STATUS REPORT

## 2021-08-07 ENCOUNTER — Ambulatory Visit: Payer: Medicaid Other | Admitting: Physician Assistant

## 2021-08-07 ENCOUNTER — Encounter: Payer: Self-pay | Admitting: Physician Assistant

## 2021-08-07 VITALS — BP 140/70 | HR 80 | Temp 97.3°F | Resp 18 | Ht 68.0 in | Wt 190.0 lb

## 2021-08-07 DIAGNOSIS — I10 Essential (primary) hypertension: Secondary | ICD-10-CM | POA: Diagnosis not present

## 2021-08-07 DIAGNOSIS — E1142 Type 2 diabetes mellitus with diabetic polyneuropathy: Secondary | ICD-10-CM

## 2021-08-07 DIAGNOSIS — E782 Mixed hyperlipidemia: Secondary | ICD-10-CM

## 2021-08-07 DIAGNOSIS — Z794 Long term (current) use of insulin: Secondary | ICD-10-CM

## 2021-08-07 DIAGNOSIS — E119 Type 2 diabetes mellitus without complications: Secondary | ICD-10-CM | POA: Diagnosis not present

## 2021-08-07 DIAGNOSIS — E559 Vitamin D deficiency, unspecified: Secondary | ICD-10-CM

## 2021-08-07 DIAGNOSIS — R600 Localized edema: Secondary | ICD-10-CM

## 2021-08-07 NOTE — Progress Notes (Signed)
? ?Subjective:  ?Patient ID: Gabriel Gomez, male    DOB: [birth year]  Age: 61 y.o. MRN: [id number] ? ?Chief Complaint  ?Patient presents with  ? Hypertension  ? Diabetes  ? ?HPI ? ?Gabriel Gomez is a 61 year old Caucasian male present for follow-up for insulin dependent diabetes, hypertension, and hyperlipidemia. He is up-to-date with colonoscopy screening, He is due for diabetic eye exam. Pt defers covid booster today  ?Type 2 Diabetes Mellitus with long-term insulin use ?Patient has been a diabetic for 26 years. He is moderately-controlled with Novolog insulin (15U Q am, 10U Q lunch, and 10 UQ dinner) and Lantus insulin (32 U QHS). He performs regular blood glucose checks daily, states they average in the mid 100s States hypoglycemia episodes are rare. ? ?Hypertension ?Patient has a history of moderately-controlled hypertension. BP 140/70 Current treatment includes Ramipril $RemoveBeforeDEI'5mg'AeELaECStNCxuIxB$  and Metoprolol 25 mg daily. Denies headache, dyspnea, chest pain or taking NTG. He states he has not experienced any hypotensive episodes, dizziness, or falls. ? ?Hyperlipidemia ?Patient has a history of hyperlipidemia for more than 1-year. Current treatment includes Lipitor 80 mg daily. He is moderately controlled.He is not currently exercising due  To chronic back and left hip pain.  ? ?Chronic Pain ?Patient has chronic pain to left hip and lower back. He has a history of left total hip replacement in [place] in approximately 3 years ago. He states he has continued to have pain to left hip. MRI on 09/09/19 shows L5-S1 mild disc bulging and subarticular stenosis. He states right now he is 'managing' his pain --he is taking lyrica $RemoveBefor'75mg'JnasnJStiMxW$  and recently started cymbalta $RemoveBeforeDE'60mg'tglhEZlWsqWRRlD$  qd which is helping ? ? ?CONSTITUTIONAL: Negative for chills, fatigue, fever, unintentional weight gain and unintentional weight loss.  ?CARDIOVASCULAR: Negative for chest pain, dizziness, palpitations and pedal edema.  ?RESPIRATORY: Negative for recent cough and dyspnea.   ?GASTROINTESTINAL: Negative for abdominal pain, acid reflux symptoms, constipation, diarrhea, nausea and vomiting.  ?MSK: see HPI ?INTEGUMENTARY: Negative for rash.  ?NEUROLOGICAL: Negative for dizziness and headaches.  ?PSYCHIATRIC: Negative for sleep disturbance and to question depression screen.  Negative for depression, negative for anhedonia.  ?   ? ? ?Current Outpatient Medications on File Prior to Visit  ?Medication Sig Dispense Refill  ? ACCU-CHEK GUIDE test strip USE ONE strip FOUR TIMES DAILY 100 strip 0  ? Accu-Chek Softclix Lancets lancets Use as instructed 100 each 12  ? atorvastatin (LIPITOR) 80 MG tablet TAKE ONE TABLET BY MOUTH ONCE DAILY AT 6pm 90 tablet 0  ? DULoxetine (CYMBALTA) 60 MG capsule Take 60 mg by mouth daily.    ? furosemide (LASIX) 20 MG tablet Take 1 tablet (20 mg total) by mouth daily. 30 tablet 3  ? LANTUS SOLOSTAR 100 UNIT/ML Solostar Pen Inject 30 Units into the skin at bedtime. 15 mL 2  ? metoprolol tartrate (LOPRESSOR) 25 MG tablet TAKE ONE TABLET BY MOUTH ONCE DAILY 90 tablet 0  ? nitroGLYCERIN (NITROSTAT) 0.4 MG SL tablet Place 1 tablet (0.4 mg total) under the tongue every 5 (five) minutes x 3 doses as needed for chest pain. 25 tablet 12  ? NOVOLOG FLEXPEN 100 UNIT/ML FlexPen inject 15 units EVERY MORNING, 10 units AT LUNCH AND 10 units EVERY EVENING 15 mL 2  ? omeprazole (PRILOSEC) 20 MG capsule TAKE ONE CAPSULE BY MOUTH ONCE DAILY AS NEEDED FOR reflux 90 capsule 0  ? pregabalin (LYRICA) 75 MG capsule Take 1 capsule (75 mg total) by mouth 2 (two) times daily. 60 capsule 3  ? ramipril (  ALTACE) 5 MG capsule TAKE ONE CAPSULE BY MOUTH ONCE DAILY 90 capsule 0  ? UNIFINE PENTIPS 32G X 4 MM MISC AS DIRECTED FOUR TIMES DAILY 100 each 2  ? VENTOLIN HFA 108 (90 Base) MCG/ACT inhaler Inhale 2 puffs into the lungs every 6 (six) hours as needed for wheezing or shortness of breath. 18 g 0  ? Blood Glucose Monitoring Suppl (ACCU-CHEK GUIDE) w/Device KIT To use to check glucose qd 1 kit 0   ? DULoxetine (CYMBALTA) 60 MG capsule Take 60 mg by mouth daily.    ? ?No current facility-administered medications on file prior to visit.  ? ?Past Medical History:  ?Diagnosis Date  ? Coronary artery disease   ? Diabetes mellitus without complication ([place])   ? GERD (gastroesophageal reflux disease)   ? ?Past Surgical History:  ?Procedure Laterality Date  ? LEFT HEART CATH AND CORONARY ANGIOGRAPHY N/A 05/30/2018  ? Procedure: LEFT HEART CATH AND CORONARY ANGIOGRAPHY;  Surgeon: Sherren Mocha, MD;  Location: [facility];  Service: Cardiovascular;  Laterality: N/A;  ?PHYSICAL EXAM:  ? ?VS: BP 140/70   Pulse 80   Temp (!) 97.3 ?F (36.3 ?C)   Resp 18   Ht $R'5\' 8"'Aw$  (1.727 m)   Wt 190 lb (86.2 kg)   BMI 28.89 kg/m?  ? ?GEN: Well nourished, well developed, in no acute distress  ?Cardiac: RRR; no murmurs, rubs, or gallops --- left leg without swelling - right leg show minimal edema (had recent ultrasound which was negative) ?Respiratory:  normal respiratory rate and pattern with no distress - normal breath sounds with no rales, rhonchi, wheezes or rubs ?GI: normal bowel sounds, no masses or tenderness ?Skin: warm and dry, no rash  ?Psych: euthymic mood, appropriate affect and demeanor ? ? ? ?Family History  ?Problem Relation Age of Onset  ? CAD Father   ? ?Social History  ? ?Socioeconomic History  ? Marital status: Married  ?  Spouse name: Not on file  ? Number of children: Not on file  ? Years of education: Not on file  ? Highest education level: Not on file  ?Occupational History  ? Not on file  ?Tobacco Use  ? Smoking status: Every Day  ?  Packs/day: 1.00  ?  Types: Cigarettes  ? Smokeless tobacco: Never  ?Substance and Sexual Activity  ? Alcohol use: Not Currently  ? Drug use: Never  ? Sexual activity: Yes  ?  Partners: Female  ?Other Topics Concern  ? Not on file  ?Social History Narrative  ? Not on file  ? ?Social Determinants of Health  ? ?Financial Resource Strain: Not on file  ?Food Insecurity: Not on  file  ?Transportation Needs: Not on file  ?Physical Activity: Not on file  ?Stress: Not on file  ?Social Connections: Not on file  ? ?Assessment & Plan:  ? ?1. Essential hypertension, benign ?- CBC with Differential/Platelet ?- Comprehensive metabolic panel ?Continue meds ? ?2. Type 2 diabetes mellitus without complication, with long-term current use of insulin ([place]) ?- Hemoglobin A1c ?Continue meds and watch diet ?3. Mixed hyperlipidemia ?- Lipid panel ?Continue meds ?4. Peripheral neuropathy ?Continue gabapentin and cymbalta as directed ? 5. GERD ?Continue prilosec ?6 leg edema ?Continue lasix ? ? ?Orders Placed This Encounter  ?Procedures  ? CBC with Differential/Platelet  ? Comprehensive metabolic panel  ? TSH  ? Lipid panel  ? Hemoglobin A1c  ? VITAMIN D 25 Hydroxy (Vit-D Deficiency, Fractures)  ?  ? ?Follow-up: Return in  about 4 months (around 12/07/2021) for chronic fasting follow up. ? ?An After Visit Summary was printed and given to the patient. ? ? ?

## 2021-08-08 ENCOUNTER — Other Ambulatory Visit: Payer: Self-pay | Admitting: Physician Assistant

## 2021-08-08 DIAGNOSIS — E559 Vitamin D deficiency, unspecified: Secondary | ICD-10-CM

## 2021-08-08 DIAGNOSIS — E871 Hypo-osmolality and hyponatremia: Secondary | ICD-10-CM

## 2021-08-08 DIAGNOSIS — R899 Unspecified abnormal finding in specimens from other organs, systems and tissues: Secondary | ICD-10-CM

## 2021-08-08 LAB — CBC WITH DIFFERENTIAL/PLATELET
Basophils Absolute: 0.1 10*3/uL (ref 0.0–0.2)
Basos: 1 %
EOS (ABSOLUTE): 0.2 10*3/uL (ref 0.0–0.4)
Eos: 1 %
Hematocrit: 40.3 % (ref 37.5–51.0)
Hemoglobin: 14.3 g/dL (ref 13.0–17.7)
Immature Grans (Abs): 0.1 10*3/uL (ref 0.0–0.1)
Immature Granulocytes: 1 %
Lymphocytes Absolute: 2.1 10*3/uL (ref 0.7–3.1)
Lymphs: 16 %
MCH: 33.8 pg — ABNORMAL HIGH (ref 26.6–33.0)
MCHC: 35.5 g/dL (ref 31.5–35.7)
MCV: 95 fL (ref 79–97)
Monocytes Absolute: 1 10*3/uL — ABNORMAL HIGH (ref 0.1–0.9)
Monocytes: 8 %
Neutrophils Absolute: 9.7 10*3/uL — ABNORMAL HIGH (ref 1.4–7.0)
Neutrophils: 73 %
Platelets: 357 10*3/uL (ref 150–450)
RBC: 4.23 x10E6/uL (ref 4.14–5.80)
RDW: 12.1 % (ref 11.6–15.4)
WBC: 13.2 10*3/uL — ABNORMAL HIGH (ref 3.4–10.8)

## 2021-08-08 LAB — COMPREHENSIVE METABOLIC PANEL
ALT: 22 IU/L (ref 0–44)
AST: 20 IU/L (ref 0–40)
Albumin/Globulin Ratio: 2 (ref 1.2–2.2)
Albumin: 4.1 g/dL (ref 3.8–4.8)
Alkaline Phosphatase: 95 IU/L (ref 44–121)
BUN/Creatinine Ratio: 13 (ref 10–24)
BUN: 12 mg/dL (ref 8–27)
Bilirubin Total: 0.6 mg/dL (ref 0.0–1.2)
CO2: 21 mmol/L (ref 20–29)
Calcium: 9 mg/dL (ref 8.6–10.2)
Chloride: 91 mmol/L — ABNORMAL LOW (ref 96–106)
Creatinine, Ser: 0.89 mg/dL (ref 0.76–1.27)
Globulin, Total: 2.1 g/dL (ref 1.5–4.5)
Glucose: 139 mg/dL — ABNORMAL HIGH (ref 70–99)
Potassium: 6 mmol/L — ABNORMAL HIGH (ref 3.5–5.2)
Sodium: 123 mmol/L — ABNORMAL LOW (ref 134–144)
Total Protein: 6.2 g/dL (ref 6.0–8.5)
eGFR: 96 mL/min/{1.73_m2} (ref >59)

## 2021-08-08 LAB — LIPID PANEL
Chol/HDL Ratio: 2.4 ratio (ref 0.0–5.0)
Cholesterol, Total: 154 mg/dL (ref 100–199)
HDL: 64 mg/dL (ref >39)
LDL Chol Calc (NIH): 78 mg/dL (ref 0–99)
Triglycerides: 61 mg/dL (ref 0–149)
VLDL Cholesterol Cal: 12 mg/dL (ref 5–40)

## 2021-08-08 LAB — TSH: TSH: 1.99 u[IU]/mL (ref 0.450–4.500)

## 2021-08-08 LAB — HEMOGLOBIN A1C
Est. average glucose Bld gHb Est-mCnc: 180 mg/dL
Hgb A1c MFr Bld: 7.9 % — ABNORMAL HIGH (ref 4.8–5.6)

## 2021-08-08 LAB — VITAMIN D 25 HYDROXY (VIT D DEFICIENCY, FRACTURES): Vit D, 25-Hydroxy: 24.4 ng/mL — ABNORMAL LOW (ref 30.0–100.0)

## 2021-08-08 LAB — CARDIOVASCULAR RISK ASSESSMENT

## 2021-08-08 MED ORDER — VITAMIN D (ERGOCALCIFEROL) 1.25 MG (50000 UNIT) PO CAPS: 50000.0000 [IU] | ORAL_CAPSULE | ORAL | 5 refills | Status: DC

## 2021-08-08 MED ORDER — SODIUM BICARBONATE 325 MG PO TABS: 325.0000 mg | ORAL_TABLET | Freq: Two times a day (BID) | ORAL | 0 refills | Status: DC

## 2021-08-08 MED ORDER — LANTUS SOLOSTAR 100 UNIT/ML ~~LOC~~ SOPN: 35.0000 [IU] | PEN_INJECTOR | Freq: Every day | SUBCUTANEOUS | 2 refills | Status: DC

## 2021-08-12 ENCOUNTER — Ambulatory Visit: Payer: Medicaid Other

## 2021-08-15 ENCOUNTER — Ambulatory Visit: Payer: Medicaid Other

## 2021-08-15 DIAGNOSIS — R899 Unspecified abnormal finding in specimens from other organs, systems and tissues: Secondary | ICD-10-CM

## 2021-08-16 LAB — COMPREHENSIVE METABOLIC PANEL
ALT: 19 IU/L (ref 0–44)
AST: 21 IU/L (ref 0–40)
Albumin/Globulin Ratio: 1.9 (ref 1.2–2.2)
Albumin: 4 g/dL (ref 3.8–4.8)
Alkaline Phosphatase: 90 IU/L (ref 44–121)
BUN/Creatinine Ratio: 12 (ref 10–24)
BUN: 12 mg/dL (ref 8–27)
Bilirubin Total: 0.8 mg/dL (ref 0.0–1.2)
CO2: 22 mmol/L (ref 20–29)
Calcium: 9.2 mg/dL (ref 8.6–10.2)
Chloride: 93 mmol/L — ABNORMAL LOW (ref 96–106)
Creatinine, Ser: 0.97 mg/dL (ref 0.76–1.27)
Globulin, Total: 2.1 g/dL (ref 1.5–4.5)
Glucose: 100 mg/dL — ABNORMAL HIGH (ref 70–99)
Potassium: 5.2 mmol/L (ref 3.5–5.2)
Sodium: 129 mmol/L — ABNORMAL LOW (ref 134–144)
Total Protein: 6.1 g/dL (ref 6.0–8.5)
eGFR: 88 mL/min/{1.73_m2} (ref >59)

## 2021-08-16 LAB — CBC WITH DIFFERENTIAL/PLATELET
Basophils Absolute: 0.1 10*3/uL (ref 0.0–0.2)
Basos: 1 %
EOS (ABSOLUTE): 0.3 10*3/uL (ref 0.0–0.4)
Eos: 2 %
Hematocrit: 38.8 % (ref 37.5–51.0)
Hemoglobin: 13.6 g/dL (ref 13.0–17.7)
Immature Grans (Abs): 0.1 10*3/uL (ref 0.0–0.1)
Immature Granulocytes: 1 %
Lymphocytes Absolute: 2.9 10*3/uL (ref 0.7–3.1)
Lymphs: 21 %
MCH: 33.1 pg — ABNORMAL HIGH (ref 26.6–33.0)
MCHC: 35.1 g/dL (ref 31.5–35.7)
MCV: 94 fL (ref 79–97)
Monocytes Absolute: 1.1 10*3/uL — ABNORMAL HIGH (ref 0.1–0.9)
Monocytes: 8 %
Neutrophils Absolute: 9.2 10*3/uL — ABNORMAL HIGH (ref 1.4–7.0)
Neutrophils: 67 %
Platelets: 356 10*3/uL (ref 150–450)
RBC: 4.11 x10E6/uL — ABNORMAL LOW (ref 4.14–5.80)
RDW: 11.7 % (ref 11.6–15.4)
WBC: 13.5 10*3/uL — ABNORMAL HIGH (ref 3.4–10.8)

## 2021-09-03 ENCOUNTER — Other Ambulatory Visit: Payer: Self-pay | Admitting: Physician Assistant

## 2021-09-03 ENCOUNTER — Encounter: Payer: Self-pay | Admitting: Physician Assistant

## 2021-09-03 ENCOUNTER — Ambulatory Visit: Payer: Medicaid Other | Admitting: Physician Assistant

## 2021-09-03 ENCOUNTER — Other Ambulatory Visit: Payer: Self-pay | Admitting: Legal Medicine

## 2021-09-03 VITALS — BP 126/82 | HR 72 | Temp 97.3°F | Ht 68.0 in | Wt 188.4 lb

## 2021-09-03 DIAGNOSIS — E1142 Type 2 diabetes mellitus with diabetic polyneuropathy: Secondary | ICD-10-CM

## 2021-09-03 DIAGNOSIS — T24202D Burn of second degree of unspecified site of left lower limb, except ankle and foot, subsequent encounter: Secondary | ICD-10-CM

## 2021-09-03 DIAGNOSIS — M79604 Pain in right leg: Secondary | ICD-10-CM

## 2021-09-03 DIAGNOSIS — G8929 Other chronic pain: Secondary | ICD-10-CM | POA: Diagnosis not present

## 2021-09-03 DIAGNOSIS — M5441 Lumbago with sciatica, right side: Secondary | ICD-10-CM

## 2021-09-03 MED ORDER — MUPIROCIN 2 % EX OINT: 1.0000 "application " | TOPICAL_OINTMENT | Freq: Two times a day (BID) | CUTANEOUS | 0 refills | Status: DC

## 2021-09-03 MED ORDER — CEPHALEXIN 500 MG PO CAPS: 500.0000 mg | ORAL_CAPSULE | Freq: Two times a day (BID) | ORAL | 0 refills | Status: DC

## 2021-09-03 MED ORDER — CYCLOBENZAPRINE HCL 5 MG PO TABS: 5.0000 mg | ORAL_TABLET | Freq: Every day | ORAL | 1 refills | Status: DC
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
## 2021-09-08 ENCOUNTER — Other Ambulatory Visit: Payer: Self-pay | Admitting: Physician Assistant

## 2021-09-08 DIAGNOSIS — J060 Acute laryngopharyngitis: Secondary | ICD-10-CM

## 2021-10-01 ENCOUNTER — Ambulatory Visit: Payer: Medicaid Other | Admitting: Orthopaedic Surgery

## 2021-10-01 ENCOUNTER — Ambulatory Visit (INDEPENDENT_AMBULATORY_CARE_PROVIDER_SITE_OTHER): Payer: Medicaid Other

## 2021-10-01 ENCOUNTER — Encounter: Payer: Self-pay | Admitting: Orthopaedic Surgery

## 2021-10-01 VITALS — BP 183/89 | HR 88 | Ht 68.0 in | Wt 191.0 lb

## 2021-10-01 DIAGNOSIS — M79604 Pain in right leg: Secondary | ICD-10-CM | POA: Diagnosis not present

## 2021-10-01 NOTE — Progress Notes (Signed)
Office Visit Note   Patient: Gabriel Gomez           Date of Birth: 1960-11-14           MRN: [id number] Visit Date: 10/01/2021              Requested by: Gabriel Duncans, PA-C [address] [place],  [place] [address] PCP: Gabriel Duncans, PA-C   Assessment & Plan: Visit Diagnoses:  1. Pain in right leg     Plan: Patient conservative treatment unable to bend his knee barely able to walk is walking with the knee extended position.  We will check a uric acid and schedule him for an MRI scan right knee rule out medial plica which is present versus medial meniscal tear.  Follow-up after MRI scan.  I will call patient with the uric acid results.  Follow-Up Instructions: No follow-ups on file.   Orders:  Orders Placed This Encounter  Procedures   XR Lumbar Spine 2-3 Views   XR Pelvis 1-2 Views   XR KNEE 3 VIEW RIGHT   No orders of the defined types were placed in this encounter.     Procedures: No procedures performed   Clinical Data: No additional findings.   Subjective: Chief Complaint  Patient presents with   Right Leg - Pain    HPI 60 year old male new patient visit with history of back pain.  Patient's had some chronic back pain did not bring his disc with him as of the history of bulging disc.  He has type 2 diabetes with peripheral neuropathy unstable angina in the past.  He has had pain in his right knee and states he cannot bend his knee with ambulation and is walking with a stiff knee gait which been present for several weeks.  States his knee catches points to the medial joint line.  Problems with previous lumbar bulging disc 2000.  MRI scan done in 2021 showed some disc protrusion at L5-S1 without severe compression.  Patient's principal problem is inability to bend his knee and inability to ambulate except with his right knee straight.  He has had swelling in his knee off and on remote history of gout.  He does not know what his last uric acid lab work  showed.  Review of Systems positive history of angina type 2 diabetes on insulin.  GERD hypertension.  PAD.   Objective: Vital Signs: BP (!) 183/89   Pulse 88   Ht '5\' 8"'$  (1.727 m)   Wt 191 lb (86.6 kg)   BMI 29.04 kg/m   Physical Exam Constitutional:      Appearance: He is well-developed.  HENT:     Head: Normocephalic and atraumatic.     Right Ear: External ear normal.     Left Ear: External ear normal.  Eyes:     Pupils: Pupils are equal, round, and reactive to light.  Neck:     Thyroid: No thyromegaly.     Trachea: No tracheal deviation.  Cardiovascular:     Rate and Rhythm: Normal rate.  Pulmonary:     Effort: Pulmonary effort is normal.     Breath sounds: No wheezing.  Abdominal:     General: Bowel sounds are normal.     Palpations: Abdomen is soft.  Musculoskeletal:     Cervical back: Neck supple.  Skin:    General: Skin is warm and dry.     Capillary Refill: Capillary refill takes less than 2 seconds.  Neurological:     Mental Status: He is alert and oriented to person, place, and time.  Psychiatric:        Behavior: Behavior normal.        Thought Content: Thought content normal.        Judgment: Judgment normal.    Ortho Exam patient has severe tenderness medially palpable medial plica medial joint lines extreme tenderness collateral ligaments are stable LCL PCL exam is normal.  Some crepitus knee range of motion he can only flex to about 70 degrees due to sharp medial joint line pain.  Tenderness in the popliteal region 2+ knee effusion right knee only negative logroll hips distal pulses is intact bilateral decreased sensation both lower extremity.  Specialty Comments:  No specialty comments available.  Imaging: No results found.   PMFS History: Patient Active Problem List   Diagnosis Date Noted   Body mass index (BMI) 27.0-27.9, adult 08/02/2020   Peripheral polyneuropathy 07/01/2020   Low back pain with sciatica 06/10/2020   Acute  laryngopharyngitis 05/21/2020   Lumbar radiculopathy 03/07/2020   Bulging lumbar disc 03/07/2020   Abnormal laboratory test 11/15/2019   Prostate cancer screening 11/15/2019   Need for tetanus, diphtheria, and acellular pertussis (Tdap) vaccine 11/06/2019   Asymptomatic microscopic hematuria 11/06/2019   Acute left-sided low back pain with left-sided sciatica 11/06/2019   Mixed hyperlipidemia 08/03/2019   Type 2 diabetes mellitus without complication, with long-term current use of insulin ([facility]) 08/03/2019   Essential hypertension, benign 08/03/2019   Hyponatremia 05/29/2018   Leukocytosis 05/29/2018   IDDM (insulin dependent diabetes mellitus) 05/29/2018   GERD (gastroesophageal reflux disease) 05/29/2018   Unstable angina ([facility]) 05/28/2018   Past Medical History:  Diagnosis Date   Coronary artery disease    Diabetes mellitus without complication (HCC)    GERD (gastroesophageal reflux disease)     Family History  Problem Relation Age of Onset   CAD Father     Past Surgical History:  Procedure Laterality Date   LEFT HEART CATH AND CORONARY ANGIOGRAPHY N/A 05/30/2018   Procedure: LEFT HEART CATH AND CORONARY ANGIOGRAPHY;  Surgeon: Sherren Mocha, MD;  Location: [facility];  Service: Cardiovascular;  Laterality: N/A;   Social History   Occupational History   Not on file  Tobacco Use   Smoking status: Every Day    Packs/day: 1.00    Types: Cigarettes   Smokeless tobacco: Never  Substance and Sexual Activity   Alcohol use: Not Currently   Drug use: Never   Sexual activity: Yes    Partners: Female

## 2021-10-08 ENCOUNTER — Telehealth: Payer: Self-pay | Admitting: Orthopaedic Surgery

## 2021-10-08 NOTE — Telephone Encounter (Signed)
Pt's daughter called asking for blood work results. Please call pt at [phone].

## 2021-10-08 NOTE — Telephone Encounter (Signed)
Pt's daughter calling for uric acid level results. Report pulled and result was 4.1 ( on your desk for review) please advise any further instructions for the pt.

## 2021-10-09 LAB — URIC ACID: Uric Acid, Serum: 4.1 mg/dL (ref 4.0–8.0)

## 2021-10-09 LAB — SPECIMEN COMPROMISED

## 2021-10-12 ENCOUNTER — Ambulatory Visit
Admission: RE | Admit: 2021-10-12 | Discharge: 2021-10-12 | Disposition: A | Discharge status: Home / Self Care | Payer: Medicaid Other | Source: Ambulatory Visit | Attending: Orthopaedic Surgery | Admitting: Orthopaedic Surgery

## 2021-10-12 DIAGNOSIS — M7121 Synovial cyst of popliteal space [Baker], right knee: Secondary | ICD-10-CM | POA: Diagnosis not present

## 2021-10-12 DIAGNOSIS — M7989 Other specified soft tissue disorders: Secondary | ICD-10-CM | POA: Diagnosis not present

## 2021-10-12 DIAGNOSIS — S83241A Other tear of medial meniscus, current injury, right knee, initial encounter: Secondary | ICD-10-CM | POA: Diagnosis not present

## 2021-10-12 DIAGNOSIS — M2241 Chondromalacia patellae, right knee: Secondary | ICD-10-CM | POA: Diagnosis not present

## 2021-10-12 DIAGNOSIS — M79604 Pain in right leg: Secondary | ICD-10-CM

## 2021-10-15 ENCOUNTER — Other Ambulatory Visit: Payer: Medicaid Other

## 2021-10-15 ENCOUNTER — Telehealth: Payer: Self-pay | Admitting: Orthopaedic Surgery

## 2021-10-15 NOTE — Telephone Encounter (Signed)
Pt daughter called asking about pts mri results.

## 2021-10-15 NOTE — Telephone Encounter (Signed)
Pt has an MRI of the knee on 10/13/2021 asking for results.

## 2021-10-18 ENCOUNTER — Other Ambulatory Visit: Payer: Self-pay | Admitting: Physician Assistant

## 2021-10-20 DIAGNOSIS — Z79899 Other long term (current) drug therapy: Secondary | ICD-10-CM | POA: Diagnosis not present

## 2021-10-20 DIAGNOSIS — E1142 Type 2 diabetes mellitus with diabetic polyneuropathy: Secondary | ICD-10-CM | POA: Diagnosis not present

## 2021-10-20 DIAGNOSIS — Z794 Long term (current) use of insulin: Secondary | ICD-10-CM | POA: Diagnosis not present

## 2021-10-20 DIAGNOSIS — M1710 Unilateral primary osteoarthritis, unspecified knee: Secondary | ICD-10-CM | POA: Diagnosis not present

## 2021-10-21 ENCOUNTER — Ambulatory Visit: Payer: Medicaid Other | Admitting: Orthopaedic Surgery

## 2021-10-21 ENCOUNTER — Encounter: Payer: Self-pay | Admitting: Orthopaedic Surgery

## 2021-10-21 VITALS — BP 156/66 | HR 70 | Ht 68.0 in | Wt 191.0 lb

## 2021-10-21 DIAGNOSIS — S83241A Other tear of medial meniscus, current injury, right knee, initial encounter: Secondary | ICD-10-CM

## 2021-10-21 DIAGNOSIS — S83206A Unspecified tear of unspecified meniscus, current injury, right knee, initial encounter: Secondary | ICD-10-CM | POA: Insufficient documentation

## 2021-10-21 DIAGNOSIS — M79604 Pain in right leg: Secondary | ICD-10-CM

## 2021-10-21 HISTORY — DX: Unspecified tear of unspecified meniscus, current injury, right knee, initial encounter: S83.206A

## 2021-10-21 MED ORDER — MELOXICAM 7.5 MG PO TABS: 7.5000 mg | ORAL_TABLET | Freq: Every day | ORAL | 2 refills | Status: DC

## 2021-10-21 NOTE — Progress Notes (Signed)
Office Visit Note   Patient: Gabriel Gomez           Date of Birth: 05-07-1961           MRN: [id number] Visit Date: 10/21/2021              Requested by: Marge Duncans, PA-C [address] [place],  [place] [address] PCP: Marge Duncans, PA-C   Assessment & Plan: Visit Diagnoses:  1. Pain in right leg   2. Other tear of medial meniscus of right knee, unspecified whether old or current tear, initial encounter     Plan: Patient need screening criteria for arterial Dopplers with history of leg pain, diabetes and continued smoking and PAD.  We will place him on some meloxicam for his knee recheck 4 weeks if he is having persistent problems and we can discuss possible knee arthroscopy for his meniscal tear.  Patient brought his lumbar MRI scan he had 2 years ago which showed some disc bulge at L5-S1.  Patient told to use his cane for fall prevention.  Follow-Up Instructions: Return in about 4 weeks (around 11/18/2021).   Orders:  Orders Placed This Encounter  Procedures   VAS [place] PAD ABI   Meds ordered this encounter  Medications   meloxicam (MOBIC) 7.5 MG tablet    Sig: Take 1 tablet (7.5 mg total) by mouth daily.    Dispense:  30 tablet    Refill:  2      Procedures: No procedures performed   Clinical Data: No additional findings.   Subjective: Chief Complaint  Patient presents with   Right Knee - Pain    HPI 61 year old male returns with ongoing problems with knee pain some catching in his knee sometimes when he turns he is almost fallen.  I have advised him use a cane but he did not bring it today but has been using it.  Several times he had to catch himself to prevent falling.  MRI scan has been obtained which shows undersurface posterior meniscal tear, Baker's cyst ruptured and partial thickness loss in the medial compartment with maintained lateral compartment.  Patient states his knee is bothering him significantly.  He has not had an injection since previous  stroke injection for his hip replacement gave him hyperglycemia and last A1c 7.9.  He is a smoker has history of PAD previous arterial Dopplers were 2 years ago he thinks done in [place].  Review of Systems all systems noncontributory HPI.   Objective: Vital Signs: BP (!) 156/66   Pulse 70   Ht '5\' 8"'$  (1.727 m)   Wt 191 lb (86.6 kg)   BMI 29.04 kg/m   Physical Exam Constitutional:      Appearance: He is well-developed.  HENT:     Head: Normocephalic and atraumatic.     Right Ear: External ear normal.     Left Ear: External ear normal.  Eyes:     Pupils: Pupils are equal, round, and reactive to light.  Neck:     Thyroid: No thyromegaly.     Trachea: No tracheal deviation.  Cardiovascular:     Rate and Rhythm: Normal rate.  Pulmonary:     Effort: Pulmonary effort is normal.     Breath sounds: No wheezing.  Abdominal:     General: Bowel sounds are normal.     Palpations: Abdomen is soft.  Musculoskeletal:     Cervical back: Neck supple.  Skin:    General: Skin is  warm and dry.     Capillary Refill: Capillary refill takes less than 2 seconds.  Neurological:     Mental Status: He is alert and oriented to person, place, and time.  Psychiatric:        Behavior: Behavior normal.        Thought Content: Thought content normal.        Judgment: Judgment normal.     Ortho Exam medial joint line pain.  Palpable Baker's cyst with tenderness.  Pain with hyperextension no lateral joint line tenderness right knee.  No plantar foot lesions foot is warm.  Specialty Comments:  No specialty comments available.  Imaging: No results found.CLINICAL DATA:  Medial and posterior knee pain, swelling x 1 month,   EXAM: MRI OF THE RIGHT KNEE WITHOUT CONTRAST   TECHNIQUE: Multiplanar, multisequence MR imaging of the knee was performed. No intravenous contrast was administered.   COMPARISON:  None Available.   FINDINGS: MENISCI   Medial: Small undersurface tear of the posterior  horn-body junction of the medial meniscus.   Lateral: Fraying of the free edge of the posterior horn of the lateral meniscus.   LIGAMENTS   Cruciates: ACL and PCL are intact.   Collaterals: Medial collateral ligament is intact. Lateral collateral ligament complex is intact.   CARTILAGE   Patellofemoral:  Chondromalacia of the patellar apex.   Medial: High-grade partial-thickness cartilage loss of the anterior aspect of the weight-bearing surface of the medial femoral condyle. Partial-thickness cartilage loss of the medial tibial plateau.   Lateral:  No chondral defect.   JOINT: Moderate joint effusion. Joint effusion. Normal Hoffa's fat-pad. No plical thickening.   POPLITEAL FOSSA: Popliteus tendon is intact. Small Baker's cyst. Small amount of fluid superficial to the medial gastrocnemius muscle likely reflecting a partially ruptured Baker's cyst.   EXTENSOR MECHANISM: Intact quadriceps tendon. Intact patellar tendon. Intact lateral patellar retinaculum. Intact medial patellar retinaculum. Intact MPFL.   BONES: No aggressive osseous lesion. No fracture or dislocation.   Other: No fluid collection or hematoma. Muscles are normal.   IMPRESSION: 1. Small undersurface tear of the posterior horn-body junction of the medial meniscus. 2. High-grade partial-thickness cartilage loss of the anterior aspect of the weight-bearing surface of the medial femoral condyle. Partial-thickness cartilage loss of the medial tibial plateau. 3. Moderate joint effusion. 4. Small partially ruptured Baker's cyst.     Electronically Signed   By: Kathreen Devoid M.D.   On: 10/13/2021 16:21  PMFS History: Patient Active Problem List   Diagnosis Date Noted   Right knee meniscal tear 10/21/2021   Body mass index (BMI) 27.0-27.9, adult 08/02/2020   Peripheral polyneuropathy 07/01/2020   Low back pain with sciatica 06/10/2020   Acute laryngopharyngitis 05/21/2020   Lumbar radiculopathy  03/07/2020   Bulging lumbar disc 03/07/2020   Abnormal laboratory test 11/15/2019   Prostate cancer screening 11/15/2019   Need for tetanus, diphtheria, and acellular pertussis (Tdap) vaccine 11/06/2019   Asymptomatic microscopic hematuria 11/06/2019   Acute left-sided low back pain with left-sided sciatica 11/06/2019   Mixed hyperlipidemia 08/03/2019   Type 2 diabetes mellitus without complication, with long-term current use of insulin ([facility]) 08/03/2019   Essential hypertension, benign 08/03/2019   Hyponatremia 05/29/2018   Leukocytosis 05/29/2018   IDDM (insulin dependent diabetes mellitus) 05/29/2018   GERD (gastroesophageal reflux disease) 05/29/2018   Unstable angina ([facility]) 05/28/2018   Past Medical History:  Diagnosis Date   Coronary artery disease    Diabetes mellitus without complication ([facility])  GERD (gastroesophageal reflux disease)     Family History  Problem Relation Age of Onset   CAD Father     Past Surgical History:  Procedure Laterality Date   LEFT HEART CATH AND CORONARY ANGIOGRAPHY N/A 05/30/2018   Procedure: LEFT HEART CATH AND CORONARY ANGIOGRAPHY;  Surgeon: Sherren Mocha, MD;  Location: [facility];  Service: Cardiovascular;  Laterality: N/A;   Social History   Occupational History   Not on file  Tobacco Use   Smoking status: Every Day    Packs/day: 1.00    Types: Cigarettes   Smokeless tobacco: Never  Substance and Sexual Activity   Alcohol use: Not Currently   Drug use: Never   Sexual activity: Yes    Partners: Female

## 2021-10-28 ENCOUNTER — Ambulatory Visit: Payer: Medicaid Other | Admitting: Orthopaedic Surgery

## 2021-10-31 ENCOUNTER — Telehealth: Payer: Self-pay

## 2021-10-31 ENCOUNTER — Ambulatory Visit (HOSPITAL_COMMUNITY)
Admission: RE | Admit: 2021-10-31 | Discharge: 2021-10-31 | Disposition: A | Discharge status: Home / Self Care | Payer: Medicaid Other | Source: Ambulatory Visit | Attending: Orthopaedic Surgery | Admitting: Orthopaedic Surgery

## 2021-10-31 DIAGNOSIS — M79604 Pain in right leg: Secondary | ICD-10-CM

## 2021-11-03 ENCOUNTER — Other Ambulatory Visit: Payer: Self-pay | Admitting: Physician Assistant

## 2021-11-03 DIAGNOSIS — G8929 Other chronic pain: Secondary | ICD-10-CM

## 2021-11-03 DIAGNOSIS — J060 Acute laryngopharyngitis: Secondary | ICD-10-CM

## 2021-11-17 ENCOUNTER — Other Ambulatory Visit: Payer: Self-pay | Admitting: Physician Assistant

## 2021-11-18 ENCOUNTER — Encounter: Payer: Self-pay | Admitting: Orthopaedic Surgery

## 2021-11-18 ENCOUNTER — Ambulatory Visit: Payer: Medicaid Other | Admitting: Orthopaedic Surgery

## 2021-11-18 VITALS — BP 135/71 | HR 63 | Ht 68.0 in | Wt 191.0 lb

## 2021-11-18 DIAGNOSIS — S83241D Other tear of medial meniscus, current injury, right knee, subsequent encounter: Secondary | ICD-10-CM | POA: Diagnosis not present

## 2021-11-18 NOTE — Progress Notes (Signed)
Office Visit Note   Patient: Gabriel Gomez           Date of Birth: 01-Jan-1961           MRN: [id number] Visit Date: 11/18/2021              Requested by: Marge Duncans, PA-C [address] [place],  [place] [address] PCP: Marge Duncans, PA-C   Assessment & Plan: Visit Diagnoses:  1. Other tear of medial meniscus of right knee, unspecified whether old or current tear, subsequent encounter     Plan: Reviewed his MRI and he does have medial meniscal tear pathology but also some medial compartment arthritis we discussed with him that arthroscopy for arthritis is generally not effective and variable in outcome.  His plain radiographs show maintained joint space.  His lateral compartment and patellofemoral compartment are normal.  He has had repetitive catching in his knee which may be related to his medial meniscal tear.  He has had previous trochanteric injection or injection in his hip for total hip arthroplasty and states that shot his sugar to 500 requiring admission.  Patient does not want intra-articular injection in his knee.  He has been using a cane taken anti-inflammatories and has had persistent catching symptoms.  We discussed them outpatient arthroscopy with partial meniscectomy may give him some improvement in his knee function catching and some relief of intermittent locking.  We discussed outpatient arthroscopy ports keeping his knee elevated after the surgery to decrease swelling and speed his recovery.  Patient relates that he has a hard time staying at the house and likes to be on the go all the time.  Discussion with patient and family members and they are requesting that we proceed with outpatient knee arthroscopy.  Procedure discussed questions elicited and answered.  Follow-Up Instructions: No follow-ups on file.   Orders:  No orders of the defined types were placed in this encounter.  No orders of the defined types were placed in this encounter.     Procedures: No  procedures performed   Clinical Data: No additional findings.   Subjective: Chief Complaint  Patient presents with   Right Leg - Pain, Follow-up    HPI 61 year old male returns with ongoing problems with his right knee with medial joint line pain.  He complains of swelling and pain in the popliteal region intermittent catching in his knee and points to the medial joint line where he has the catching.  He has had arterial studies which show some decrease arterial flow in the left leg but right leg tested normal.  He has had past history of free vascularized fibular graft to his left hip that lasted 15 to 20 years and then ultimately had total hip arthroplasty done in [place] which continues to do well.  Patient has no pain with sitting but right knee medially hurts whenever he walks and is repetitive catching is a problem.  Patient is here with family and we reviewed his MRI scan again today.  Review of Systems positive for type 2 diabetes on insulin positive for GERD PAD left lower extremity.  Current smoker.  All the systems noncontributory to HPI.   Objective: Vital Signs: BP 135/71   Pulse 63   Ht '5\' 8"'$  (1.727 m)   Wt 191 lb (86.6 kg)   BMI 29.04 kg/m   Physical Exam Constitutional:      Appearance: He is well-developed.  HENT:     Head: Normocephalic and atraumatic.  Right Ear: External ear normal.     Left Ear: External ear normal.  Eyes:     Pupils: Pupils are equal, round, and reactive to light.  Neck:     Thyroid: No thyromegaly.     Trachea: No tracheal deviation.  Cardiovascular:     Rate and Rhythm: Normal rate.  Pulmonary:     Effort: Pulmonary effort is normal.     Breath sounds: No wheezing.  Abdominal:     General: Bowel sounds are normal.     Palpations: Abdomen is soft.  Musculoskeletal:     Cervical back: Neck supple.  Skin:    General: Skin is warm and dry.     Capillary Refill: Capillary refill takes less than 2 seconds.  Neurological:      Mental Status: He is alert and oriented to person, place, and time.  Psychiatric:        Behavior: Behavior normal.        Thought Content: Thought content normal.        Judgment: Judgment normal.     Ortho Exam patient has right knee medial joint line tenderness tiny plica nontender.  No pain with patellofemoral loading.  Varus valgus and cruciate ligament testing is normal.  He has medial joint line tenderness mid and posterior joint line worse.  Palpable Baker's cyst which is tender.  Specialty Comments:  No specialty comments available.  Imaging: Study Result  Narrative & Impression  CLINICAL DATA:  Medial and posterior knee pain, swelling x 1 month,   EXAM: MRI OF THE RIGHT KNEE WITHOUT CONTRAST   TECHNIQUE: Multiplanar, multisequence MR imaging of the knee was performed. No intravenous contrast was administered.   COMPARISON:  None Available.   FINDINGS: MENISCI   Medial: Small undersurface tear of the posterior horn-body junction of the medial meniscus.   Lateral: Fraying of the free edge of the posterior horn of the lateral meniscus.   LIGAMENTS   Cruciates: ACL and PCL are intact.   Collaterals: Medial collateral ligament is intact. Lateral collateral ligament complex is intact.   CARTILAGE   Patellofemoral:  Chondromalacia of the patellar apex.   Medial: High-grade partial-thickness cartilage loss of the anterior aspect of the weight-bearing surface of the medial femoral condyle. Partial-thickness cartilage loss of the medial tibial plateau.   Lateral:  No chondral defect.   JOINT: Moderate joint effusion. Joint effusion. Normal Hoffa's fat-pad. No plical thickening.   POPLITEAL FOSSA: Popliteus tendon is intact. Small Baker's cyst. Small amount of fluid superficial to the medial gastrocnemius muscle likely reflecting a partially ruptured Baker's cyst.   EXTENSOR MECHANISM: Intact quadriceps tendon. Intact patellar tendon. Intact lateral  patellar retinaculum. Intact medial patellar retinaculum. Intact MPFL.   BONES: No aggressive osseous lesion. No fracture or dislocation.   Other: No fluid collection or hematoma. Muscles are normal.   IMPRESSION: 1. Small undersurface tear of the posterior horn-body junction of the medial meniscus. 2. High-grade partial-thickness cartilage loss of the anterior aspect of the weight-bearing surface of the medial femoral condyle. Partial-thickness cartilage loss of the medial tibial plateau. 3. Moderate joint effusion. 4. Small partially ruptured Baker's cyst.     Electronically Signed   By: Kathreen Devoid M.D.   On: 10/13/2021 16:21     PMFS History: Patient Active Problem List   Diagnosis Date Noted   Right knee meniscal tear 10/21/2021   Body mass index (BMI) 27.0-27.9, adult 08/02/2020   Peripheral polyneuropathy 07/01/2020   Low back pain with  sciatica 06/10/2020   Acute laryngopharyngitis 05/21/2020   Lumbar radiculopathy 03/07/2020   Bulging lumbar disc 03/07/2020   Abnormal laboratory test 11/15/2019   Prostate cancer screening 11/15/2019   Need for tetanus, diphtheria, and acellular pertussis (Tdap) vaccine 11/06/2019   Asymptomatic microscopic hematuria 11/06/2019   Acute left-sided low back pain with left-sided sciatica 11/06/2019   Mixed hyperlipidemia 08/03/2019   Type 2 diabetes mellitus without complication, with long-term current use of insulin ([facility]) 08/03/2019   Essential hypertension, benign 08/03/2019   Hyponatremia 05/29/2018   Leukocytosis 05/29/2018   IDDM (insulin dependent diabetes mellitus) 05/29/2018   GERD (gastroesophageal reflux disease) 05/29/2018   Unstable angina ([facility]) 05/28/2018   Past Medical History:  Diagnosis Date   Coronary artery disease    Diabetes mellitus without complication ([facility])    GERD (gastroesophageal reflux disease)     Family History  Problem Relation Age of Onset   CAD Father     Past Surgical History:  Procedure  Laterality Date   LEFT HEART CATH AND CORONARY ANGIOGRAPHY N/A 05/30/2018   Procedure: LEFT HEART CATH AND CORONARY ANGIOGRAPHY;  Surgeon: Sherren Mocha, MD;  Location: [facility];  Service: Cardiovascular;  Laterality: N/A;   Social History   Occupational History   Not on file  Tobacco Use   Smoking status: Every Day    Packs/day: 1.00    Types: Cigarettes   Smokeless tobacco: Never  Substance and Sexual Activity   Alcohol use: Not Currently   Drug use: Never   Sexual activity: Yes    Partners: Female

## 2021-12-04 ENCOUNTER — Other Ambulatory Visit: Payer: Self-pay | Admitting: Physician Assistant

## 2021-12-08 ENCOUNTER — Encounter (HOSPITAL_BASED_OUTPATIENT_CLINIC_OR_DEPARTMENT_OTHER): Payer: Self-pay | Admitting: Orthopaedic Surgery

## 2021-12-10 ENCOUNTER — Encounter: Payer: Self-pay | Admitting: Physician Assistant

## 2021-12-10 ENCOUNTER — Ambulatory Visit (INDEPENDENT_AMBULATORY_CARE_PROVIDER_SITE_OTHER): Payer: Medicaid Other | Admitting: Physician Assistant

## 2021-12-10 VITALS — BP 140/74 | HR 89 | Temp 97.4°F | Ht 68.0 in | Wt 188.4 lb

## 2021-12-10 DIAGNOSIS — M5441 Lumbago with sciatica, right side: Secondary | ICD-10-CM | POA: Diagnosis not present

## 2021-12-10 DIAGNOSIS — S83281S Other tear of lateral meniscus, current injury, right knee, sequela: Secondary | ICD-10-CM

## 2021-12-10 DIAGNOSIS — G8929 Other chronic pain: Secondary | ICD-10-CM

## 2021-12-10 DIAGNOSIS — K219 Gastro-esophageal reflux disease without esophagitis: Secondary | ICD-10-CM

## 2021-12-10 DIAGNOSIS — E782 Mixed hyperlipidemia: Secondary | ICD-10-CM

## 2021-12-10 DIAGNOSIS — Z794 Long term (current) use of insulin: Secondary | ICD-10-CM

## 2021-12-10 DIAGNOSIS — I10 Essential (primary) hypertension: Secondary | ICD-10-CM | POA: Diagnosis not present

## 2021-12-10 DIAGNOSIS — E1142 Type 2 diabetes mellitus with diabetic polyneuropathy: Secondary | ICD-10-CM

## 2021-12-10 DIAGNOSIS — E1140 Type 2 diabetes mellitus with diabetic neuropathy, unspecified: Secondary | ICD-10-CM | POA: Diagnosis not present

## 2021-12-10 DIAGNOSIS — Z125 Encounter for screening for malignant neoplasm of prostate: Secondary | ICD-10-CM | POA: Diagnosis not present

## 2021-12-10 DIAGNOSIS — J060 Acute laryngopharyngitis: Secondary | ICD-10-CM | POA: Diagnosis not present

## 2021-12-10 MED ORDER — CYCLOBENZAPRINE HCL 5 MG PO TABS: ORAL_TABLET | ORAL | 1 refills | Status: DC

## 2021-12-10 MED ORDER — ALBUTEROL SULFATE HFA 108 (90 BASE) MCG/ACT IN AERS: 2.0000 | INHALATION_SPRAY | Freq: Four times a day (QID) | RESPIRATORY_TRACT | 5 refills | Status: DC | PRN

## 2021-12-10 NOTE — Progress Notes (Signed)
Subjective:  Patient ID: Gabriel Gomez, male    DOB: [birth year]  Age: 61 y.o. MRN: [id number]  Chief Complaint  Patient presents with   Hypertension    HPI  Pt presents for follow up of hypertension. The patient is tolerating the medication well without side effects. Compliance with treatment has been good; including taking medication as directed , maintains a healthy diet and regular exercise regimen , and following up as directed. He is currently taking lopressor $RemoveBeforeDE'25mg'WIduurShONTQdVl$  qd and also taking altace $RemoveBefor'5mg'sYMbUmwOzpGE$  qd  Gabriel Gomez has a history of type 2 Diabetes Mellitus for more than 10 years. Current treatment includeslantus 35 units at night and novolog sliding scale (most of time uses 15 units at lunch and 8-10 units at supper)  He denies hypoglycemic episodes. He states blood glucose levels at home range from 100 to 200. He  is consuming a heart healthy diet and exercising regularly. He performs diabetic foot checks daily after showering. He is due for eye exam this year He takes lyrica for history of diabetic neuropathy  Pt with history of right knee pain and will be having surgery on Monday with Dr Samule Dry - he reqested to get EKG today for preop physical as well He denies chest pain or dyspnea Pt does have history of CAD - currently taking Lipitor $RemoveBefore'80mg'VFcpHmievvFGD$  qd- pt had a heart cath 05/2018 that showed nonobstructive CAD however has not followed up with cardiology since that time He states he would like to defer making appt with them at this time until after his knee surgery  Mixed hyperlipidemia  Pt presents with hyperlipidemia.  Compliance with treatment has been good The patient is compliant with medications, maintains a low cholesterol diet , follows up as directed , and maintains an exercise regimen . The patient denies experiencing any hypercholesterolemia related symptoms. He is currently on lipitor $RemoveBe'80mg'sGPjtdDKK$  qd  Pt with history of GERD - stable on prilosec $RemoveBef'20mg'yfdEKbTfQr$  qd   Lab Results  Component Value  Date   HGBA1C 7.9 (H) 08/07/2021   HGBA1C 7.7 (H) 04/09/2021   HGBA1C 7.8 (H) 12/12/2020     Current Outpatient Medications on File Prior to Visit  Medication Sig Dispense Refill   ACCU-CHEK GUIDE test strip USE ONE strip FOUR TIMES DAILY 100 strip 0   Accu-Chek Softclix Lancets lancets Use as instructed 100 each 12   atorvastatin (LIPITOR) 80 MG tablet TAKE ONE TABLET BY MOUTH ONCE DAILY AT 6pm 90 tablet 0   Blood Glucose Monitoring Suppl (ACCU-CHEK GUIDE) w/Device KIT To use to check glucose qd 1 kit 0   DULoxetine (CYMBALTA) 60 MG capsule Take 60 mg by mouth daily.     furosemide (LASIX) 20 MG tablet Take 1 tablet (20 mg total) by mouth daily. 30 tablet 3   insulin aspart protamine - aspart (NOVOLOG 70/30 MIX) (70-30) 100 UNIT/ML FlexPen Novolog Mix 70-30 FlexPen U-100 Insulin 100 unit/mL subcutaneous pen  Inject by subcutaneous route.     LANTUS SOLOSTAR 100 UNIT/ML Solostar Pen Inject 30 Units into the skin at bedtime. (Patient taking differently: 35 Units at bedtime.) 15 mL 2   meloxicam (MOBIC) 7.5 MG tablet Take 1 tablet (7.5 mg total) by mouth daily. 30 tablet 2   metoprolol tartrate (LOPRESSOR) 25 MG tablet TAKE ONE TABLET BY MOUTH ONCE DAILY 90 tablet 0   nitroGLYCERIN (NITROSTAT) 0.4 MG SL tablet Place 1 tablet (0.4 mg total) under the tongue every 5 (five) minutes x 3 doses as needed for chest pain.  25 tablet 12   NOVOLOG FLEXPEN 100 UNIT/ML FlexPen inject 15 units EVERY MORNING, 10 units AT LUNCH AND 10 units EVERY EVENING 15 mL 2   omeprazole (PRILOSEC) 20 MG capsule TAKE ONE CAPSULE BY MOUTH ONCE DAILY AS NEEDED FOR reflux 90 capsule 0   pregabalin (LYRICA) 75 MG capsule TAKE ONE CAPSULE BY MOUTH TWICE DAILY 60 capsule 3   ramipril (ALTACE) 5 MG capsule TAKE ONE CAPSULE BY MOUTH ONCE DAILY 90 capsule 0   SURE COMFORT PEN NEEDLES 32G X 4 MM MISC AS DIRECTED FOUR TIMES DAILY 100 each 0   No current facility-administered medications on file prior to visit.   Past Medical  History:  Diagnosis Date   Arthritis    COPD (chronic obstructive pulmonary disease) ([place])    Coronary artery disease    Diabetes mellitus without complication (HCC)    GERD (gastroesophageal reflux disease)    Past Surgical History:  Procedure Laterality Date   JOINT REPLACEMENT Left    LEFT HEART CATH AND CORONARY ANGIOGRAPHY N/A 05/30/2018   Procedure: LEFT HEART CATH AND CORONARY ANGIOGRAPHY;  Surgeon: Sherren Mocha, MD;  Location: [facility];  Service: Cardiovascular;  Laterality: N/A;   liver laceration  1979    Family History  Problem Relation Age of Onset   CAD Father    Social History   Socioeconomic History   Marital status: Married    Spouse name: Not on file   Number of children: Not on file   Years of education: Not on file   Highest education level: Not on file  Occupational History   Not on file  Tobacco Use   Smoking status: Every Day    Packs/day: 1.00    Types: Cigarettes   Smokeless tobacco: Never  Substance and Sexual Activity   Alcohol use: Yes    Comment: social beer   Drug use: Never   Sexual activity: Yes    Partners: Female  Other Topics Concern   Not on file  Social History Narrative   Not on file   Social Determinants of Health   Financial Resource Strain: Not on file  Food Insecurity: Not on file  Transportation Needs: Not on file  Physical Activity: Not on file  Stress: Not on file  Social Connections: Not on file    Review of Systems CONSTITUTIONAL: Negative for chills, fatigue, fever, unintentional weight gain and unintentional weight loss.  E/N/T: Negative for ear pain, nasal congestion and sore throat.  CARDIOVASCULAR: Negative for chest pain, dizziness, palpitations and pedal edema.  RESPIRATORY: Negative for recent cough and dyspnea.  GASTROINTESTINAL: Negative for abdominal pain, acid reflux symptoms, constipation, diarrhea, nausea and vomiting.  MSK: see HPI INTEGUMENTARY: Negative for rash.  NEUROLOGICAL:  Negative for dizziness and headaches.  PSYCHIATRIC: Negative for sleep disturbance and to question depression screen.  Negative for depression, negative for anhedonia.       Objective:  PHYSICAL EXAM:   VS: BP (!) 140/74 (BP Location: Left Arm, Patient Position: Sitting, Cuff Size: Normal)   Pulse 89   Temp (!) 97.4 F (36.3 C) (Temporal)   Ht $R'5\' 8"'Aw$  (1.727 m)   Wt 188 lb 6.4 oz (85.5 kg)   SpO2 95%   BMI 28.65 kg/m   GEN: Well nourished, well developed, in no acute distress   Cardiac: RRR; no murmurs, rubs, or gallops,no edema -  Respiratory:  normal respiratory rate and pattern with no distress - normal breath sounds with no rales, rhonchi, wheezes  or rubs GI: normal bowel sounds, no masses or tenderness MS: walks with limp - right knee painful to palpation Skin: warm and dry, no rash   Psych: euthymic mood, appropriate affect and demeanor  EKG -peaked T waves noted - will check cmp Lab Results  Component Value Date   WBC 13.5 (H) 08/15/2021   HGB 13.6 08/15/2021   HCT 38.8 08/15/2021   PLT 356 08/15/2021   GLUCOSE 100 (H) 08/15/2021   CHOL 154 08/07/2021   TRIG 61 08/07/2021   HDL 64 08/07/2021   LDLCALC 78 08/07/2021   ALT 19 08/15/2021   AST 21 08/15/2021   NA 129 (L) 08/15/2021   K 5.2 08/15/2021   CL 93 (L) 08/15/2021   CREATININE 0.97 08/15/2021   BUN 12 08/15/2021   CO2 22 08/15/2021   TSH 1.990 08/07/2021   INR 1.01 05/28/2018   HGBA1C 7.9 (H) 08/07/2021   MICROALBUR neg 08/03/2019      Assessment & Plan:   Problem List Items Addressed This Visit       Cardiovascular and Mediastinum   Essential hypertension, benign - Primary   Relevant Orders   CBC with Differential/Platelet   Comprehensive metabolic panel   EKG 82-XHBZ Continue current meds                   Digestive   GERD (gastroesophageal reflux disease) Continue current meds     Nervous and Auditory   Low back pain with sciatica   Relevant Medications   cyclobenzaprine  (FLEXERIL) 5 MG tablet     Musculoskeletal and Integument   Right knee meniscal tear Follow up with ortho as directed     Other   Mixed hyperlipidemia   Relevant Orders   Lipid panel Watch diet and continue meds   Prostate cancer screening   Relevant Orders   PSA   Other Visit Diagnoses     Diabetic polyneuropathy associated with type 2 diabetes mellitus (HCC)       Relevant Medications   insulin aspart protamine - aspart (NOVOLOG 70/30 MIX) (70-30) 100 UNIT/ML FlexPen   cyclobenzaprine (FLEXERIL) 5 MG tablet   Type 2 diabetes mellitus with diabetic neuropathy, with long-term current use of insulin (HCC)       Relevant Medications   insulin aspart protamine - aspart (NOVOLOG 70/30 MIX) (70-30) 100 UNIT/ML FlexPen   Other Relevant Orders   CBC with Differential/Platelet   Comprehensive metabolic panel   Lipid panel   Hemoglobin A1c     .  Meds ordered this encounter  Medications   albuterol (VENTOLIN HFA) 108 (90 Base) MCG/ACT inhaler    Sig: Inhale 2 puffs into the lungs every 6 (six) hours as needed for wheezing or shortness of breath.    Dispense:  18 g    Refill:  5    Order Specific Question:   Supervising Provider    Answer:   COX, Lynder Parents   cyclobenzaprine (FLEXERIL) 5 MG tablet    Sig: 1 po bid prn    Dispense:  60 tablet    Refill:  1    Order Specific Question:   Supervising Provider    AnswerShelton Silvas    Orders Placed This Encounter  Procedures   CBC with Differential/Platelet   Comprehensive metabolic panel   Lipid panel   Hemoglobin A1c   PSA   EKG 12-Lead     Follow-up: Return in about 4 months (around 04/11/2022) for chronic  fasting follow up.  An After Visit Summary was printed and given to the patient.  Yetta Flock [facility] [phone]

## 2021-12-11 LAB — HEMOGLOBIN A1C
Est. average glucose Bld gHb Est-mCnc: 169 mg/dL
Hgb A1c MFr Bld: 7.5 % — ABNORMAL HIGH (ref 4.8–5.6)

## 2021-12-11 LAB — COMPREHENSIVE METABOLIC PANEL
ALT: 23 IU/L (ref 0–44)
AST: 28 IU/L (ref 0–40)
Albumin/Globulin Ratio: 1.8 (ref 1.2–2.2)
Albumin: 3.9 g/dL (ref 3.9–4.9)
Alkaline Phosphatase: 83 IU/L (ref 44–121)
BUN/Creatinine Ratio: 15 (ref 10–24)
BUN: 15 mg/dL (ref 8–27)
Bilirubin Total: 0.9 mg/dL (ref 0.0–1.2)
CO2: 21 mmol/L (ref 20–29)
Calcium: 8.9 mg/dL (ref 8.6–10.2)
Chloride: 94 mmol/L — ABNORMAL LOW (ref 96–106)
Creatinine, Ser: 1.02 mg/dL (ref 0.76–1.27)
Globulin, Total: 2.2 g/dL (ref 1.5–4.5)
Glucose: 89 mg/dL (ref 70–99)
Potassium: 5.8 mmol/L — ABNORMAL HIGH (ref 3.5–5.2)
Sodium: 130 mmol/L — ABNORMAL LOW (ref 134–144)
Total Protein: 6.1 g/dL (ref 6.0–8.5)
eGFR: 83 mL/min/{1.73_m2} (ref >59)

## 2021-12-11 LAB — CBC WITH DIFFERENTIAL/PLATELET
Basophils Absolute: 0.1 10*3/uL (ref 0.0–0.2)
Basos: 1 %
EOS (ABSOLUTE): 0.5 10*3/uL — ABNORMAL HIGH (ref 0.0–0.4)
Eos: 5 %
Hematocrit: 39.5 % (ref 37.5–51.0)
Hemoglobin: 13.4 g/dL (ref 13.0–17.7)
Immature Grans (Abs): 0.1 10*3/uL (ref 0.0–0.1)
Immature Granulocytes: 1 %
Lymphocytes Absolute: 2.5 10*3/uL (ref 0.7–3.1)
Lymphs: 27 %
MCH: 33.5 pg — ABNORMAL HIGH (ref 26.6–33.0)
MCHC: 33.9 g/dL (ref 31.5–35.7)
MCV: 99 fL — ABNORMAL HIGH (ref 79–97)
Monocytes Absolute: 1 10*3/uL — ABNORMAL HIGH (ref 0.1–0.9)
Monocytes: 11 %
Neutrophils Absolute: 5.2 10*3/uL (ref 1.4–7.0)
Neutrophils: 55 %
Platelets: 273 10*3/uL (ref 150–450)
RBC: 4 x10E6/uL — ABNORMAL LOW (ref 4.14–5.80)
RDW: 12.9 % (ref 11.6–15.4)
WBC: 9.3 10*3/uL (ref 3.4–10.8)

## 2021-12-11 LAB — PSA: Prostate Specific Ag, Serum: 0.2 ng/mL (ref 0.0–4.0)

## 2021-12-11 LAB — LIPID PANEL
Chol/HDL Ratio: 2.1 ratio (ref 0.0–5.0)
Cholesterol, Total: 154 mg/dL (ref 100–199)
HDL: 74 mg/dL (ref >39)
LDL Chol Calc (NIH): 65 mg/dL (ref 0–99)
Triglycerides: 82 mg/dL (ref 0–149)
VLDL Cholesterol Cal: 15 mg/dL (ref 5–40)

## 2021-12-11 LAB — CARDIOVASCULAR RISK ASSESSMENT

## 2021-12-12 ENCOUNTER — Other Ambulatory Visit: Payer: Medicaid Other

## 2021-12-12 DIAGNOSIS — I10 Essential (primary) hypertension: Secondary | ICD-10-CM | POA: Diagnosis not present

## 2021-12-12 NOTE — Progress Notes (Signed)
Pt's K=5.8. Notified Dr Smith Robert (anesthesia) who wants to repeat the lab prior to surgery. Called patient and told him we need to repeat. He doesn't want to come to [place] (from [place]) to have lab drawn today and says "I'll get my primary Dr to do it today." Told him the lab is a "Basic Metabolic Panel" and the repeat will be required prior to surgery on Monday. Pt assures me he can get this done through his primary MD and is going to call now.

## 2021-12-13 ENCOUNTER — Other Ambulatory Visit: Payer: Self-pay

## 2021-12-13 ENCOUNTER — Encounter (HOSPITAL_BASED_OUTPATIENT_CLINIC_OR_DEPARTMENT_OTHER): Payer: Self-pay | Admitting: Emergency Medicine

## 2021-12-13 ENCOUNTER — Telehealth: Payer: Self-pay | Admitting: Nurse Practitioner

## 2021-12-13 ENCOUNTER — Emergency Department (HOSPITAL_BASED_OUTPATIENT_CLINIC_OR_DEPARTMENT_OTHER)
Admission: EM | Admit: 2021-12-13 | Discharge: 2021-12-13 | Disposition: A | Discharge status: Home / Self Care | Payer: Medicaid Other | Attending: Emergency Medicine | Admitting: Emergency Medicine

## 2021-12-13 DIAGNOSIS — E119 Type 2 diabetes mellitus without complications: Secondary | ICD-10-CM | POA: Insufficient documentation

## 2021-12-13 DIAGNOSIS — Z794 Long term (current) use of insulin: Secondary | ICD-10-CM | POA: Diagnosis not present

## 2021-12-13 DIAGNOSIS — M25569 Pain in unspecified knee: Secondary | ICD-10-CM | POA: Insufficient documentation

## 2021-12-13 DIAGNOSIS — J449 Chronic obstructive pulmonary disease, unspecified: Secondary | ICD-10-CM | POA: Diagnosis not present

## 2021-12-13 DIAGNOSIS — Z79899 Other long term (current) drug therapy: Secondary | ICD-10-CM | POA: Insufficient documentation

## 2021-12-13 DIAGNOSIS — I2510 Atherosclerotic heart disease of native coronary artery without angina pectoris: Secondary | ICD-10-CM | POA: Diagnosis not present

## 2021-12-13 DIAGNOSIS — H9313 Tinnitus, bilateral: Secondary | ICD-10-CM | POA: Insufficient documentation

## 2021-12-13 DIAGNOSIS — R42 Dizziness and giddiness: Secondary | ICD-10-CM | POA: Insufficient documentation

## 2021-12-13 DIAGNOSIS — R799 Abnormal finding of blood chemistry, unspecified: Secondary | ICD-10-CM | POA: Diagnosis present

## 2021-12-13 LAB — BASIC METABOLIC PANEL
BUN/Creatinine Ratio: 9 — ABNORMAL LOW (ref 10–24)
BUN: 9 mg/dL (ref 8–27)
CO2: 21 mmol/L (ref 20–29)
Calcium: 9.3 mg/dL (ref 8.6–10.2)
Chloride: 93 mmol/L — ABNORMAL LOW (ref 96–106)
Creatinine, Ser: 0.95 mg/dL (ref 0.76–1.27)
Glucose: 215 mg/dL — ABNORMAL HIGH (ref 70–99)
Potassium: 6.5 mmol/L (ref 3.5–5.2)
Sodium: 126 mmol/L — ABNORMAL LOW (ref 134–144)
eGFR: 90 mL/min/{1.73_m2} (ref >59)

## 2021-12-13 LAB — CBC WITH DIFFERENTIAL/PLATELET
Abs Immature Granulocytes: 0.04 10*3/uL (ref 0.00–0.07)
Basophils Absolute: 0.1 10*3/uL (ref 0.0–0.1)
Basophils Relative: 1 %
Eosinophils Absolute: 0.3 10*3/uL (ref 0.0–0.5)
Eosinophils Relative: 4 %
HCT: 35.7 % — ABNORMAL LOW (ref 39.0–52.0)
Hemoglobin: 12.8 g/dL — ABNORMAL LOW (ref 13.0–17.0)
Immature Granulocytes: 1 %
Lymphocytes Relative: 33 %
Lymphs Abs: 2.7 10*3/uL (ref 0.7–4.0)
MCH: 34.2 pg — ABNORMAL HIGH (ref 26.0–34.0)
MCHC: 35.9 g/dL (ref 30.0–36.0)
MCV: 95.5 fL (ref 80.0–100.0)
Monocytes Absolute: 0.8 10*3/uL (ref 0.1–1.0)
Monocytes Relative: 10 %
Neutro Abs: 4.3 10*3/uL (ref 1.7–7.7)
Neutrophils Relative %: 51 %
Platelets: 265 10*3/uL (ref 150–400)
RBC: 3.74 MIL/uL — ABNORMAL LOW (ref 4.22–5.81)
RDW: 12.6 % (ref 11.5–15.5)
WBC: 8.3 10*3/uL (ref 4.0–10.5)
nRBC: 0 % (ref 0.0–0.2)

## 2021-12-13 LAB — COMPREHENSIVE METABOLIC PANEL
ALT: 26 U/L (ref 0–44)
AST: 31 U/L (ref 15–41)
Albumin: 3.4 g/dL — ABNORMAL LOW (ref 3.5–5.0)
Alkaline Phosphatase: 65 U/L (ref 38–126)
Anion gap: 6 (ref 5–15)
BUN: 15 mg/dL (ref 8–23)
CO2: 23 mmol/L (ref 22–32)
Calcium: 8.4 mg/dL — ABNORMAL LOW (ref 8.9–10.3)
Chloride: 99 mmol/L (ref 98–111)
Creatinine, Ser: 1.14 mg/dL (ref 0.61–1.24)
GFR, Estimated: >60 mL/min (ref >60)
Glucose, Bld: 330 mg/dL — ABNORMAL HIGH (ref 70–99)
Potassium: 4.4 mmol/L (ref 3.5–5.1)
Sodium: 128 mmol/L — ABNORMAL LOW (ref 135–145)
Total Bilirubin: 1 mg/dL (ref 0.3–1.2)
Total Protein: 6.3 g/dL — ABNORMAL LOW (ref 6.5–8.1)

## 2021-12-13 LAB — MAGNESIUM: Magnesium: 1.6 mg/dL — ABNORMAL LOW (ref 1.7–2.4)

## 2021-12-13 MED ORDER — MAGNESIUM SULFATE 2 GM/50ML IV SOLN
2.0000 g | Freq: Once | INTRAVENOUS | Status: AC
  Administered 2021-12-13: 2 g via INTRAVENOUS
  Filled 2021-12-13: qty 50

## 2021-12-13 MED ORDER — HYDROCODONE-ACETAMINOPHEN 5-325 MG PO TABS
1.0000 | ORAL_TABLET | Freq: Once | ORAL | Status: AC
  Administered 2021-12-13: 1 via ORAL
  Filled 2021-12-13: qty 1

## 2021-12-13 MED ORDER — SODIUM CHLORIDE 0.9 % IV SOLN: INTRAVENOUS | Status: DC | PRN

## 2021-12-13 MED ORDER — HYDROCODONE-ACETAMINOPHEN 5-325 MG PO TABS: 2.0000 | ORAL_TABLET | Freq: Two times a day (BID) | ORAL | 0 refills | Status: DC | PRN

## 2021-12-13 NOTE — ED Provider Notes (Signed)
[facility] EMERGENC[facility] DEPARTMENT Provider Note   CSN: [id number] Arrival date & time: 12/13/21  1717     History  Chief Complaint  Patient presents with   Dizziness    Gabriel Gomez is a 61 y.o. male.  HPI     61 year old male comes in with chief complaint of abnormal labs.  He states that the main reason he is in the emergency room is because his doctor or anesthesiologist called him and said his potassium was too high, that he needed to come to the ER.  He however does notice that for the last few days he has been having some episodes of dizziness, described as unsteadiness, that is more pronounced today than usual.  He also has mild ringing in the ear, no new hearing loss.  Patient has history of CAD, diabetes, COPD and is supposed to get knee procedure done soon.  He does not have any kidney disease.  Review of system is negative for any palpitations, myalgias, nausea, vomiting, diarrhea, reduced p.o. intake  Home Medications Prior to Admission medications   Medication Sig Start Date End Date Taking? Authorizing Provider  HYDROcodone-acetaminophen (NORCO/VICODIN) 5-325 MG tablet Take 2 tablets by mouth every 12 (twelve) hours as needed. 12/13/21  Yes Varney Biles, MD  ACCU-CHEK GUIDE test strip USE ONE strip FOUR TIMES DAILY 12/04/21   Marge Duncans, PA-C  Accu-Chek Softclix Lancets lancets Use as instructed 06/25/21   Marge Duncans, PA-C  albuterol (VENTOLIN HFA) 108 (90 Base) MCG/ACT inhaler Inhale 2 puffs into the lungs every 6 (six) hours as needed for wheezing or shortness of breath. 12/10/21   Marge Duncans, PA-C  atorvastatin (LIPITOR) 80 MG tablet TAKE ONE TABLET BY MOUTH ONCE DAILY AT 6pm 12/04/21   Marge Duncans, PA-C  Blood Glucose Monitoring Suppl (ACCU-CHEK GUIDE) w/Device KIT To use to check glucose qd 06/16/21   Marge Duncans, PA-C  cyclobenzaprine (FLEXERIL) 5 MG tablet 1 po bid prn 12/10/21   Marge Duncans, PA-C  DULoxetine (CYMBALTA) 60 MG capsule Take 60 mg by mouth  daily. 08/04/21   [provider]  furosemide (LASIX) 20 MG tablet Take 1 tablet (20 mg total) by mouth daily. 08/01/21   Rip Harbour, NP  insulin aspart protamine - aspart (NOVOLOG 70/30 MIX) (70-30) 100 UNIT/ML FlexPen Novolog Mix 70-30 FlexPen U-100 Insulin 100 unit/mL subcutaneous pen  Inject by subcutaneous route.    [provider]  LANTUS SOLOSTAR 100 UNIT/ML Solostar Pen Inject 30 Units into the skin at bedtime. Patient taking differently: 35 Units at bedtime. 10/19/21   Cox, Elnita Maxwell, MD  meloxicam (MOBIC) 7.5 MG tablet Take 1 tablet (7.5 mg total) by mouth daily. 10/21/21   [name], MD  metoprolol tartrate (LOPRESSOR) 25 MG tablet TAKE ONE TABLET BY MOUTH ONCE DAILY 12/04/21   Marge Duncans, PA-C  nitroGLYCERIN (NITROSTAT) 0.4 MG SL tablet Place 1 tablet (0.4 mg total) under the tongue every 5 (five) minutes x 3 doses as needed for chest pain. 05/30/18   Barrett, Evelene Croon, PA-C  NOVOLOG FLEXPEN 100 UNIT/ML FlexPen inject 15 units EVERY MORNING, 10 units AT LUNCH AND 10 units EVERY EVENING 02/10/21   Marge Duncans, PA-C  omeprazole (PRILOSEC) 20 MG capsule TAKE ONE CAPSULE BY MOUTH ONCE DAILY AS NEEDED FOR reflux 11/17/21   Marge Duncans, PA-C  pregabalin (LYRICA) 75 MG capsule TAKE ONE CAPSULE BY MOUTH TWICE DAILY 09/03/21   Marge Duncans, PA-C  ramipril (ALTACE) 5 MG capsule TAKE ONE CAPSULE BY MOUTH  ONCE DAILY 12/04/21   Marge Duncans, PA-C  SURE COMFORT PEN NEEDLES 32G X 4 MM MISC AS DIRECTED FOUR TIMES DAILY 11/03/21   Marge Duncans, PA-C      Allergies    Patient has no known allergies.    Review of Systems   Review of Systems  All other systems reviewed and are negative.   Physical Exam Updated Vital Signs BP (!) 198/84   Pulse 72   Temp 97.8 F (36.6 C) (Oral)   Resp 18   Ht _0  (1.778 m)   Wt 87.1 kg   SpO2 98%   BMI 27.55 kg/m  Physical Exam Vitals and nursing note reviewed.  Constitutional:      Appearance: He is well-developed.  HENT:     Head:  Atraumatic.  Eyes:     Extraocular Movements: Extraocular movements intact.     Pupils: Pupils are equal, round, and reactive to light.  Cardiovascular:     Rate and Rhythm: Normal rate.  Pulmonary:     Effort: Pulmonary effort is normal.  Musculoskeletal:     Cervical back: Neck supple.  Skin:    General: Skin is warm.  Neurological:     Mental Status: He is alert and oriented to person, place, and time.     Cranial Nerves: No cranial nerve deficit.     Sensory: No sensory deficit.     Motor: No weakness.     Coordination: Coordination normal.     Gait: Gait normal.     ED Results / Procedures / Treatments   Labs (all labs ordered are listed, but only abnormal results are displayed) Labs Reviewed  CBC WITH DIFFERENTIAL/PLATELET - Abnormal; Notable for the following components:      Result Value   RBC 3.74 (*)    Hemoglobin 12.8 (*)    HCT 35.7 (*)    MCH 34.2 (*)    All other components within normal limits  COMPREHENSIVE METABOLIC PANEL - Abnormal; Notable for the following components:   Sodium 128 (*)    Glucose, Bld 330 (*)    Calcium 8.4 (*)    Total Protein 6.3 (*)    Albumin 3.4 (*)    All other components within normal limits  MAGNESIUM - Abnormal; Notable for the following components:   Magnesium 1.6 (*)    All other components within normal limits  CBG MONITORING, ED    EKG None  Radiology No results found.  Procedures Procedures    Medications Ordered in ED Medications  0.9 %  sodium chloride infusion ( Intravenous New Bag/Given 12/13/21 1842)  magnesium sulfate IVPB 2 g 50 mL (2 g Intravenous New Bag/Given 12/13/21 1843)    ED Course/ Medical Decision Making/ A&P                           Medical Decision Making Risk Prescription drug management.   This patient presents to the ED with chief complaint(s) of abnormal lab with pertinent past medical history of CAD, diabetes, COPD which further complicates the presenting complaint. The  complaint involves an extensive differential diagnosis and also carries with it a high risk of complications and morbidity.    Patient also mentioned dizziness while here, but he denies any vertigo, ataxia, diplopia, dysarthria, dysphagia.  His neuro exam is nonfocal.  He indicates that the main reason why he is here is the phone call from his PCP, he wants to make sure  his potassium is fine so that he can get his surgery on Monday.  He is not too concerned about the dizziness, as in general it is a chronic symptom.  The differential diagnosis includes hyperkalemia secondary to AKI, rhabdomyolysis. Patient however denies any cramping and has no musculoskeletal pain and denies any excessive strenuous activity.  I reviewed patient's lab.  It appears that the sample was not hemolyzed and potassium was 6.5.  His initial EKG does have some peaked T waves type finding, but there is no other lab abnormality.  Unclear why he would have hyperkalemia at the outpatient setting.  We will get a repeat BMP before we react to the potassium.  Lab error is certainly possible.  The initial plan is to repeat CBC and metabolic profile   Additional history obtained: Additional history obtained from family Records reviewed previous admission documents  Independent labs interpretation:  The following labs were independently interpreted: Patient's lab today reveals potassium as normal.   Treatment and Reassessment: Results of the ER work-up discussed with the patient.  Discussed with him considering repeat metabolic profile if he wants to be 100% sure, but patient is very comfortable with the lab results here.  He states that he has had multiple instances where he had received a call for abnormal labs and then the repeat lab is normal.  He is slightly aggravated with those errors, and just wants to go home and get some rest.  He has no renal failure, no good reason to have hyperkalemia.  Reviewed patient's  medication, no red flags that either.  Safe for discharge.  Final Clinical Impression(s) / ED Diagnoses Final diagnoses:  Hypomagnesemia  Tinnitus of both ears  Knee pain, unspecified chronicity, unspecified laterality    Rx / DC Orders ED Discharge Orders          Ordered    HYDROcodone-acetaminophen (NORCO/VICODIN) 5-325 MG tablet  Every 12 hours PRN        12/13/21 1955              Varney Biles, MD 12/14/21 1721

## 2021-12-13 NOTE — Discharge Instructions (Addendum)
You are seen in the ER for abnormal labs.  Your potassium level in the emergency room is normal.  Your magnesium was slightly low, we have replaced it.  No other concerning findings including renal function.  You are also mentioned ringing in the ears.  If your symptoms persist, consider calling ENT doctor at the number provided for further evaluation.  Return to the emergency room if you start having hearing loss, severe headache, neck pain, unsteady gait.

## 2021-12-13 NOTE — Telephone Encounter (Signed)
Pt has critically high K+ 6.5, scheduled for knee arthroscopy on 12/15/21 with Dr Rodell Perna. Recommended pt go to [facility] to have treatment for hyperkalemia. Notified pt's daughter Octaviano Glow, on duty provider at [facility], and answering service with Dr Eldridge Abrahams office.

## 2021-12-13 NOTE — ED Triage Notes (Signed)
Pt arrives pov, slow gait, reports referred by pcp for hyperkalemia. Pt also c/o "feeling lightheaded. Pt denies CP. Sched. For knee surgery on Monday

## 2021-12-13 NOTE — ED Notes (Signed)
ED Provider at bedside. 

## 2021-12-14 NOTE — Progress Notes (Signed)
Dr. Lorin Mercy: see [facility] visit as his potassium was better and may be able to proceed with knee surgery after all on Monday. Blood count normal.  Liver function normal.  Kidney function normal.  Sodium low, but improved.  Potassium was high. Repeated potassium level was 6.5 on repeat cmp on Saturday 8/5. Send to med center in [place] by Jerrell Belfast, NP.  Cholesterol: good. At goal.  HBA1C: 7.5. Please confirm patient's insulin regimen. Patient med list: shows lantus and novolog, but also novolog 70/30. He should not be on both of these. Please let me know what he is taking, so I can adjust.  PSA normal.

## 2021-12-15 ENCOUNTER — Ambulatory Visit (HOSPITAL_BASED_OUTPATIENT_CLINIC_OR_DEPARTMENT_OTHER): Payer: Medicaid Other | Admitting: Anesthesiology

## 2021-12-15 ENCOUNTER — Encounter (HOSPITAL_BASED_OUTPATIENT_CLINIC_OR_DEPARTMENT_OTHER)
Admission: RE | Disposition: A | Discharge status: Home / Self Care | Payer: Self-pay | Source: Home / Self Care | Attending: Orthopaedic Surgery

## 2021-12-15 ENCOUNTER — Ambulatory Visit (HOSPITAL_BASED_OUTPATIENT_CLINIC_OR_DEPARTMENT_OTHER)
Admission: RE | Admit: 2021-12-15 | Discharge: 2021-12-15 | Disposition: A | Discharge status: Home / Self Care | Payer: Medicaid Other | Attending: Orthopaedic Surgery | Admitting: Orthopaedic Surgery

## 2021-12-15 ENCOUNTER — Other Ambulatory Visit: Payer: Self-pay

## 2021-12-15 DIAGNOSIS — F17210 Nicotine dependence, cigarettes, uncomplicated: Secondary | ICD-10-CM | POA: Insufficient documentation

## 2021-12-15 DIAGNOSIS — Z01818 Encounter for other preprocedural examination: Secondary | ICD-10-CM

## 2021-12-15 DIAGNOSIS — J449 Chronic obstructive pulmonary disease, unspecified: Secondary | ICD-10-CM | POA: Insufficient documentation

## 2021-12-15 DIAGNOSIS — K219 Gastro-esophageal reflux disease without esophagitis: Secondary | ICD-10-CM | POA: Insufficient documentation

## 2021-12-15 DIAGNOSIS — M1990 Unspecified osteoarthritis, unspecified site: Secondary | ICD-10-CM | POA: Diagnosis not present

## 2021-12-15 DIAGNOSIS — S83511A Sprain of anterior cruciate ligament of right knee, initial encounter: Secondary | ICD-10-CM

## 2021-12-15 DIAGNOSIS — S83241D Other tear of medial meniscus, current injury, right knee, subsequent encounter: Secondary | ICD-10-CM | POA: Insufficient documentation

## 2021-12-15 DIAGNOSIS — E119 Type 2 diabetes mellitus without complications: Secondary | ICD-10-CM | POA: Diagnosis not present

## 2021-12-15 DIAGNOSIS — M94261 Chondromalacia, right knee: Secondary | ICD-10-CM

## 2021-12-15 DIAGNOSIS — M23322 Other meniscus derangements, posterior horn of medial meniscus, left knee: Secondary | ICD-10-CM | POA: Diagnosis not present

## 2021-12-15 DIAGNOSIS — I10 Essential (primary) hypertension: Secondary | ICD-10-CM | POA: Insufficient documentation

## 2021-12-15 DIAGNOSIS — I2510 Atherosclerotic heart disease of native coronary artery without angina pectoris: Secondary | ICD-10-CM | POA: Insufficient documentation

## 2021-12-15 DIAGNOSIS — X58XXXD Exposure to other specified factors, subsequent encounter: Secondary | ICD-10-CM | POA: Insufficient documentation

## 2021-12-15 HISTORY — DX: Chronic obstructive pulmonary disease, unspecified: J44.9

## 2021-12-15 HISTORY — PX: KNEE ARTHROSCOPY: SHX127

## 2021-12-15 HISTORY — DX: Unspecified osteoarthritis, unspecified site: M19.90

## 2021-12-15 HISTORY — PX: CHONDROPLASTY: SHX5177

## 2021-12-15 LAB — GLUCOSE, CAPILLARY
Glucose-Capillary: 177 mg/dL — ABNORMAL HIGH (ref 70–99)
Glucose-Capillary: 165 mg/dL — ABNORMAL HIGH (ref 70–99)

## 2021-12-15 SURGERY — ARTHROSCOPY, KNEE
Anesthesia: General | Site: Knee | Laterality: Right

## 2021-12-15 MED ORDER — BUPIVACAINE-EPINEPHRINE (PF) 0.25% -1:200000 IJ SOLN
INTRAMUSCULAR | Status: AC
  Filled 2021-12-15: qty 30

## 2021-12-15 MED ORDER — HYDROMORPHONE HCL 1 MG/ML IJ SOLN
0.2500 mg | INTRAMUSCULAR | Status: DC | PRN
  Administered 2021-12-15 (×3): 0.5 mg via INTRAVENOUS

## 2021-12-15 MED ORDER — OXYCODONE-ACETAMINOPHEN 5-325 MG PO TABS: 1.0000 | ORAL_TABLET | Freq: Four times a day (QID) | ORAL | 0 refills | Status: DC | PRN

## 2021-12-15 MED ORDER — MIDAZOLAM HCL 2 MG/2ML IJ SOLN
INTRAMUSCULAR | Status: DC | PRN
  Administered 2021-12-15: 2 mg via INTRAVENOUS

## 2021-12-15 MED ORDER — ONDANSETRON HCL 4 MG/2ML IJ SOLN
INTRAMUSCULAR | Status: DC | PRN
  Administered 2021-12-15: 4 mg via INTRAVENOUS

## 2021-12-15 MED ORDER — MIDAZOLAM HCL 2 MG/2ML IJ SOLN
INTRAMUSCULAR | Status: AC
  Filled 2021-12-15: qty 2

## 2021-12-15 MED ORDER — PROMETHAZINE HCL 25 MG/ML IJ SOLN: 6.2500 mg | INTRAMUSCULAR | Status: DC | PRN

## 2021-12-15 MED ORDER — CEFAZOLIN SODIUM-DEXTROSE 2-3 GM-%(50ML) IV SOLR
INTRAVENOUS | Status: DC | PRN
  Administered 2021-12-15: 2 g via INTRAVENOUS

## 2021-12-15 MED ORDER — BUPIVACAINE-EPINEPHRINE (PF) 0.5% -1:200000 IJ SOLN
INTRAMUSCULAR | Status: AC
  Filled 2021-12-15: qty 30

## 2021-12-15 MED ORDER — BUPIVACAINE-EPINEPHRINE (PF) 0.5% -1:200000 IJ SOLN
INTRAMUSCULAR | Status: DC | PRN
  Administered 2021-12-15: 30 mL

## 2021-12-15 MED ORDER — SODIUM CHLORIDE 0.9 % IR SOLN
Status: DC | PRN
  Administered 2021-12-15: 3000 mL

## 2021-12-15 MED ORDER — LACTATED RINGERS IV SOLN: INTRAVENOUS | Status: DC

## 2021-12-15 MED ORDER — CEFAZOLIN SODIUM-DEXTROSE 2-4 GM/100ML-% IV SOLN: 2.0000 g | INTRAVENOUS | Status: DC

## 2021-12-15 MED ORDER — LIDOCAINE HCL (CARDIAC) PF 100 MG/5ML IV SOSY
PREFILLED_SYRINGE | INTRAVENOUS | Status: DC | PRN
  Administered 2021-12-15: 100 mg via INTRAVENOUS

## 2021-12-15 MED ORDER — LIDOCAINE 2% (20 MG/ML) 5 ML SYRINGE
INTRAMUSCULAR | Status: AC
  Filled 2021-12-15: qty 5

## 2021-12-15 MED ORDER — DEXAMETHASONE SODIUM PHOSPHATE 10 MG/ML IJ SOLN
INTRAMUSCULAR | Status: AC
  Filled 2021-12-15: qty 1

## 2021-12-15 MED ORDER — BUPIVACAINE HCL (PF) 0.25 % IJ SOLN
INTRAMUSCULAR | Status: AC
  Filled 2021-12-15: qty 30

## 2021-12-15 MED ORDER — PROPOFOL 10 MG/ML IV BOLUS
INTRAVENOUS | Status: DC | PRN
  Administered 2021-12-15: 200 mg via INTRAVENOUS

## 2021-12-15 MED ORDER — FENTANYL CITRATE (PF) 100 MCG/2ML IJ SOLN
INTRAMUSCULAR | Status: AC
  Filled 2021-12-15: qty 2

## 2021-12-15 MED ORDER — OXYCODONE HCL 5 MG PO TABS
5.0000 mg | ORAL_TABLET | Freq: Once | ORAL | Status: AC | PRN
  Administered 2021-12-15: 5 mg via ORAL

## 2021-12-15 MED ORDER — ONDANSETRON HCL 4 MG/2ML IJ SOLN
INTRAMUSCULAR | Status: AC
  Filled 2021-12-15: qty 2

## 2021-12-15 MED ORDER — PROPOFOL 10 MG/ML IV BOLUS
INTRAVENOUS | Status: AC
  Filled 2021-12-15: qty 20

## 2021-12-15 MED ORDER — PHENYLEPHRINE HCL (PRESSORS) 10 MG/ML IV SOLN
INTRAVENOUS | Status: DC | PRN
  Administered 2021-12-15 (×2): 80 ug via INTRAVENOUS
  Administered 2021-12-15: 40 ug via INTRAVENOUS

## 2021-12-15 MED ORDER — LACTATED RINGERS IV SOLN: INTRAVENOUS | Status: DC | PRN

## 2021-12-15 MED ORDER — OXYCODONE HCL 5 MG/5ML PO SOLN: 5.0000 mg | Freq: Once | ORAL | Status: AC | PRN

## 2021-12-15 MED ORDER — AMISULPRIDE (ANTIEMETIC) 5 MG/2ML IV SOLN: 10.0000 mg | Freq: Once | INTRAVENOUS | Status: DC | PRN

## 2021-12-15 MED ORDER — OXYCODONE HCL 5 MG PO TABS
ORAL_TABLET | ORAL | Status: AC
  Filled 2021-12-15: qty 1

## 2021-12-15 MED ORDER — FENTANYL CITRATE (PF) 100 MCG/2ML IJ SOLN
INTRAMUSCULAR | Status: DC | PRN
  Administered 2021-12-15: 100 ug via INTRAVENOUS

## 2021-12-15 MED ORDER — HYDROMORPHONE HCL 1 MG/ML IJ SOLN
INTRAMUSCULAR | Status: AC
  Filled 2021-12-15: qty 0.5

## 2021-12-15 MED ORDER — CEFAZOLIN SODIUM-DEXTROSE 2-4 GM/100ML-% IV SOLN
INTRAVENOUS | Status: AC
  Filled 2021-12-15: qty 100

## 2021-12-15 MED ORDER — HYDROMORPHONE HCL 1 MG/ML IJ SOLN
INTRAMUSCULAR | Status: AC
  Filled 2021-12-15: qty 1

## 2021-12-15 MED ORDER — DEXAMETHASONE SODIUM PHOSPHATE 10 MG/ML IJ SOLN
INTRAMUSCULAR | Status: DC | PRN
  Administered 2021-12-15: 4 mg via INTRAVENOUS

## 2021-12-15 MED ORDER — MEPERIDINE HCL 25 MG/ML IJ SOLN: 6.2500 mg | INTRAMUSCULAR | Status: DC | PRN

## 2021-12-15 SURGICAL SUPPLY — 40 items
APL SKNCLS STERI-STRIP NONHPOA (GAUZE/BANDAGES/DRESSINGS) ×1
BENZOIN TINCTURE PRP APPL 2/3 (GAUZE/BANDAGES/DRESSINGS) ×2 IMPLANT
BLADE CLIPPER SURG (BLADE) ×1 IMPLANT
BLADE EXCALIBUR 4.0X13 (MISCELLANEOUS) IMPLANT
BNDG ELASTIC 6X5.8 VLCR STR LF (GAUZE/BANDAGES/DRESSINGS) ×4 IMPLANT
DISSECTOR  3.8MM X 13CM (MISCELLANEOUS) ×2
DISSECTOR 3.8MM X 13CM (MISCELLANEOUS) ×1 IMPLANT
DRAPE ARTHROSCOPY W/POUCH 90 (DRAPES) ×2 IMPLANT
DRSG TEGADERM 4X4.75 (GAUZE/BANDAGES/DRESSINGS) ×2 IMPLANT
DURAPREP 26ML APPLICATOR (WOUND CARE) ×2 IMPLANT
ELECT MENISCUS 165MM 90D (ELECTRODE) IMPLANT
ELECT REM PT RETURN 9FT ADLT (ELECTROSURGICAL) ×2
ELECTRODE REM PT RTRN 9FT ADLT (ELECTROSURGICAL) IMPLANT
GAUZE SPONGE 4X4 12PLY STRL (GAUZE/BANDAGES/DRESSINGS) ×2 IMPLANT
GLOVE BIO SURGEON STRL SZ 6.5 (GLOVE) ×1 IMPLANT
GLOVE BIO SURGEON STRL SZ7.5 (GLOVE) ×2 IMPLANT
GLOVE BIOGEL PI IND STRL 7.0 (GLOVE) IMPLANT
GLOVE BIOGEL PI IND STRL 8 (GLOVE) ×1 IMPLANT
GLOVE BIOGEL PI INDICATOR 7.0 (GLOVE) ×3
GLOVE BIOGEL PI INDICATOR 8 (GLOVE) ×1
GLOVE SURG SS PI 6.5 STRL IVOR (GLOVE) ×2 IMPLANT
GOWN STRL REUS W/ TWL LRG LVL3 (GOWN DISPOSABLE) IMPLANT
GOWN STRL REUS W/TWL LRG LVL3 (GOWN DISPOSABLE) ×2
HOLDER KNEE FOAM BLUE (MISCELLANEOUS) ×2 IMPLANT
MANIFOLD NEPTUNE II (INSTRUMENTS) ×1 IMPLANT
PACK ARTHROSCOPY DSU (CUSTOM PROCEDURE TRAY) ×2 IMPLANT
PACK BASIN DAY SURGERY FS (CUSTOM PROCEDURE TRAY) ×2 IMPLANT
PAD CAST 4YDX4 CTTN HI CHSV (CAST SUPPLIES) ×1 IMPLANT
PADDING CAST ABS 4INX4YD NS (CAST SUPPLIES)
PADDING CAST ABS 6INX4YD NS (CAST SUPPLIES)
PADDING CAST ABS COTTON 4X4 ST (CAST SUPPLIES) IMPLANT
PADDING CAST ABS COTTON 6X4 NS (CAST SUPPLIES) IMPLANT
PADDING CAST COTTON 4X4 STRL (CAST SUPPLIES) ×2
PADDING CAST COTTON 6X4 STRL (CAST SUPPLIES) ×2 IMPLANT
PENCIL SMOKE EVACUATOR (MISCELLANEOUS) IMPLANT
STRIP CLOSURE SKIN 1/4X4 (GAUZE/BANDAGES/DRESSINGS) ×2 IMPLANT
TOWEL GREEN STERILE FF (TOWEL DISPOSABLE) ×2 IMPLANT
TUBING ARTHROSCOPY IRRIG 16FT (MISCELLANEOUS) ×2 IMPLANT
WATER STERILE IRR 1000ML POUR (IV SOLUTION) ×1 IMPLANT
WRAP KNEE MAXI GEL POST OP (GAUZE/BANDAGES/DRESSINGS) ×1 IMPLANT

## 2021-12-15 NOTE — Op Note (Signed)
Pre-op diagnosis: Right knee posterior medial meniscal posterior horn tear with medial compartment chondromalacia.  Postop diagnosis: Right knee medial meniscus intact, medial compartment chondromalacia, partial old ACL tear.  Procedure: Diagnostic and operative arthroscopy right knee.  Medial compartment chondroplasty.  (Not down to bone)  Surgeon: Rodell Perna, MD  Anesthesia: General +30 cc Marcaine and end of case.  Findings: Preop MRI showed undersurface tear of the posterior horn of the medial meniscus.  Arthroscopic evaluation showed meniscus was intact.  There was some fraying of the medial meniscus anteriorly and the extensive grade 3 changes and some areas of grade 4 changes on the medial compartment with flap tears on both medial femoral condyle and medial tibial articular surface which were debrided.  Procedure: After induction of general anesthesia orotracheal base and timeout procedure leg holder hair clipping prepping with DuraPrep the usual arthroscopic sheets trace pouch were applied.  Timeout procedure was completed.  No antibiotics were indicated.  After timeout inflows placed to the scope lateral parapatellar tendon portal.  Medial parapatellar tendon portal was used for probing and arthroscopic debridement.  Knee was sequentially inspected.  Patellofemoral joint showed some mild fraying of the patella which was smoothed with a shaver.  Lateral compartment showed's slight fraying of the edge of the lateral meniscus articular cartilage was intact on both sides no arthritis lateral compartment.  PCL was normal ACL showed old tear off the femoral attachment with scarring down the posterior capsule and PCL.  Probing the ACL showed there was mild laxity and there was a trace pivot shift under general anesthesia.  Medial compartment showed medial chondromalacia with combination of grade 3 and grade 4 changes on the tibial side as well as femoral side.  Shaver 4 mm was used for debriding  smoothing areas particularly at the articular cartilage junction and with flap tears.  Meniscus was carefully probed.  The anterior portion of meniscus had some fraying which was trimmed with a shaver.  Posterior meniscus was carefully inspected scope was placed past through the notch back rotated in the posterior and was carefully inspected as it was probed undersurface was intact there was no tearing the meniscus would not sublux into the knee.  No meniscectomy or partial meniscectomy was acquired and continued debridement smoothing the medial tibial and femoral articular cartilage is smooth as possible.  Knee was thoroughly lavaged.  Suctioned dry tincture benzoin Steri-Strips and Tegaderm for reports marked infiltration 30 cc 4 x 4's ABD web roll and Ace wrap was applied for postoperative dressing patient tolerated the procedure well transferred covering stable condition.

## 2021-12-15 NOTE — Anesthesia Postprocedure Evaluation (Signed)
Anesthesia Post Note  Patient: Gabriel Gomez  Procedure(s) Performed: RIGHT KNEE ARTHROSCOPY (Right: Knee) MEDIAL COMPARTMENT CHONDROPLASTY (Right: Knee)     Patient location during evaluation: PACU Anesthesia Type: General Level of consciousness: awake and alert Pain management: pain level controlled Vital Signs Assessment: post-procedure vital signs reviewed and stable Respiratory status: spontaneous breathing, nonlabored ventilation and respiratory function stable Cardiovascular status: blood pressure returned to baseline and stable Postop Assessment: no apparent nausea or vomiting Anesthetic complications: no   No notable events documented.  Last Vitals:  Vitals:   12/15/21 0856 12/15/21 0912  BP: (!) 141/68 127/63  Pulse: 71 73  Resp: 15 17  Temp:  (!) 36.1 C  SpO2: 94% 95%    Last Pain:  Vitals:   12/15/21 0909  PainSc: Sharon Micaela Stith

## 2021-12-15 NOTE — H&P (Signed)
Assessment & Plan: Visit Diagnoses:  1. Other tear of medial meniscus of right knee, unspecified whether old or current tear, subsequent encounter       Plan: Reviewed his MRI and he does have medial meniscal tear pathology but also some medial compartment arthritis we discussed with him that arthroscopy for arthritis is generally not effective and variable in outcome.  His plain radiographs show maintained joint space.  His lateral compartment and patellofemoral compartment are normal.  He has had repetitive catching in his knee which may be related to his medial meniscal tear.  He has had previous trochanteric injection or injection in his hip for total hip arthroplasty and states that shot his sugar to 500 requiring admission.  Patient does not want intra-articular injection in his knee.  He has been using a cane taken anti-inflammatories and has had persistent catching symptoms.  We discussed them outpatient arthroscopy with partial meniscectomy may give him some improvement in his knee function catching and some relief of intermittent locking.  We discussed outpatient arthroscopy ports keeping his knee elevated after the surgery to decrease swelling and speed his recovery.  Patient relates that he has a hard time staying at the house and likes to be on the go all the time.  Discussion with patient and family members and they are requesting that we proceed with outpatient knee arthroscopy.  Procedure discussed questions elicited and answered.   Follow-Up Instructions: No follow-ups on file.    Orders:  No orders of the defined types were placed in this encounter.   No orders of the defined types were placed in this encounter.        Procedures: No procedures performed     Clinical Data: No additional findings.     Subjective:    Chief Complaint  Patient presents with   Right Leg - Pain, Follow-up      HPI 61 year old male returns with ongoing problems with his right knee with  medial joint line pain.  He complains of swelling and pain in the popliteal region intermittent catching in his knee and points to the medial joint line where he has the catching.  He has had arterial studies which show some decrease arterial flow in the left leg but right leg tested normal.  He has had past history of free vascularized fibular graft to his left hip that lasted 15 to 20 years and then ultimately had total hip arthroplasty done in [place] which continues to do well.  Patient has no pain with sitting but right knee medially hurts whenever he walks and is repetitive catching is a problem.  Patient is here with family and we reviewed his MRI scan again today.   Review of Systems positive for type 2 diabetes on insulin positive for GERD PAD left lower extremity.  Current smoker.  All the systems noncontributory to HPI.     Objective: Vital Signs: BP 135/71   Pulse 63   Ht '5\' 8"'$  (1.727 m)   Wt 191 lb (86.6 kg)   BMI 29.04 kg/m    Physical Exam Constitutional:      Appearance: He is well-developed.  HENT:     Head: Normocephalic and atraumatic.     Right Ear: External ear normal.     Left Ear: External ear normal.  Eyes:     Pupils: Pupils are equal, round, and reactive to light.  Neck:     Thyroid: No thyromegaly.     Trachea: No tracheal  deviation.  Cardiovascular:     Rate and Rhythm: Normal rate.  Pulmonary:     Effort: Pulmonary effort is normal.     Breath sounds: No wheezing.  Abdominal:     General: Bowel sounds are normal.     Palpations: Abdomen is soft.  Musculoskeletal:     Cervical back: Neck supple.  Skin:    General: Skin is warm and dry.     Capillary Refill: Capillary refill takes less than 2 seconds.  Neurological:     Mental Status: He is alert and oriented to person, place, and time.  Psychiatric:        Behavior: Behavior normal.        Thought Content: Thought content normal.        Judgment: Judgment normal.        Ortho Exam patient  has right knee medial joint line tenderness tiny plica nontender.  No pain with patellofemoral loading.  Varus valgus and cruciate ligament testing is normal.  He has medial joint line tenderness mid and posterior joint line worse.  Palpable Baker's cyst which is tender.   Specialty Comments:  No specialty comments available.   Imaging: Study Result   Narrative & Impression  CLINICAL DATA:  Medial and posterior knee pain, swelling x 1 month,   EXAM: MRI OF THE RIGHT KNEE WITHOUT CONTRAST   TECHNIQUE: Multiplanar, multisequence MR imaging of the knee was performed. No intravenous contrast was administered.   COMPARISON:  None Available.   FINDINGS: MENISCI   Medial: Small undersurface tear of the posterior horn-body junction of the medial meniscus.   Lateral: Fraying of the free edge of the posterior horn of the lateral meniscus.   LIGAMENTS   Cruciates: ACL and PCL are intact.   Collaterals: Medial collateral ligament is intact. Lateral collateral ligament complex is intact.   CARTILAGE   Patellofemoral:  Chondromalacia of the patellar apex.   Medial: High-grade partial-thickness cartilage loss of the anterior aspect of the weight-bearing surface of the medial femoral condyle. Partial-thickness cartilage loss of the medial tibial plateau.   Lateral:  No chondral defect.   JOINT: Moderate joint effusion. Joint effusion. Normal Hoffa's fat-pad. No plical thickening.   POPLITEAL FOSSA: Popliteus tendon is intact. Small Baker's cyst. Small amount of fluid superficial to the medial gastrocnemius muscle likely reflecting a partially ruptured Baker's cyst.   EXTENSOR MECHANISM: Intact quadriceps tendon. Intact patellar tendon. Intact lateral patellar retinaculum. Intact medial patellar retinaculum. Intact MPFL.   BONES: No aggressive osseous lesion. No fracture or dislocation.   Other: No fluid collection or hematoma. Muscles are normal.   IMPRESSION: 1. Small  undersurface tear of the posterior horn-body junction of the medial meniscus. 2. High-grade partial-thickness cartilage loss of the anterior aspect of the weight-bearing surface of the medial femoral condyle. Partial-thickness cartilage loss of the medial tibial plateau. 3. Moderate joint effusion. 4. Small partially ruptured Baker's cyst.     Electronically Signed   By: Kathreen Devoid M.D.   On: 10/13/2021 16:21        PMFS History:     Patient Active Problem List    Diagnosis Date Noted   Right knee meniscal tear 10/21/2021   Body mass index (BMI) 27.0-27.9, adult 08/02/2020   Peripheral polyneuropathy 07/01/2020   Low back pain with sciatica 06/10/2020   Acute laryngopharyngitis 05/21/2020   Lumbar radiculopathy 03/07/2020   Bulging lumbar disc 03/07/2020   Abnormal laboratory test 11/15/2019   Prostate cancer screening 11/15/2019  Need for tetanus, diphtheria, and acellular pertussis (Tdap) vaccine 11/06/2019   Asymptomatic microscopic hematuria 11/06/2019   Acute left-sided low back pain with left-sided sciatica 11/06/2019   Mixed hyperlipidemia 08/03/2019   Type 2 diabetes mellitus without complication, with long-term current use of insulin ([place]) 08/03/2019   Essential hypertension, benign 08/03/2019   Hyponatremia 05/29/2018   Leukocytosis 05/29/2018   IDDM (insulin dependent diabetes mellitus) 05/29/2018   GERD (gastroesophageal reflux disease) 05/29/2018   Unstable angina ([place]) 05/28/2018        Past Medical History:  Diagnosis Date   Coronary artery disease     Diabetes mellitus without complication (HCC)     GERD (gastroesophageal reflux disease)           Family History  Problem Relation Age of Onset   CAD Father           Past Surgical History:  Procedure Laterality Date   LEFT HEART CATH AND CORONARY ANGIOGRAPHY N/A 05/30/2018    Procedure: LEFT HEART CATH AND CORONARY ANGIOGRAPHY;  Surgeon: Sherren Mocha, MD;  Location: [facility];   Service: Cardiovascular;  Laterality: N/A;    Social History         Occupational History   Not on file  Tobacco Use   Smoking status: Every Day      Packs/day: 1.00      Types: Cigarettes   Smokeless tobacco: Never  Substance and Sexual Activity   Alcohol use: Not Currently   Drug use: Never   Sexual activity: Yes      Partners: Female

## 2021-12-15 NOTE — Transfer of Care (Signed)
Immediate Anesthesia Transfer of Care Note  Patient: Gabriel Gomez  Procedure(s) Performed: RIGHT KNEE ARTHROSCOPY (Right: Knee) MEDIAL COMPARTMENT CHONDROPLASTY (Right: Knee)  Patient Location: PACU  Anesthesia Type:General  Level of Consciousness: awake and alert   Airway & Oxygen Therapy: Patient Spontanous Breathing and Patient connected to face mask oxygen  Post-op Assessment: Report given to RN and Post -op Vital signs reviewed and stable  Post vital signs: Reviewed and stable  Last Vitals:  Vitals Value Taken Time  BP 144/77 12/15/21 0821  Temp    Pulse 69 12/15/21 0822  Resp 16 12/15/21 0822  SpO2 98 % 12/15/21 0822  Vitals shown include unvalidated device data.  Last Pain:  Vitals:   12/15/21 0632  PainSc: 8       Patients Stated Pain Goal: 3 (44/81/85 6314)  Complications: No notable events documented.

## 2021-12-15 NOTE — Anesthesia Procedure Notes (Signed)
Procedure Name: LMA Insertion Date/Time: 12/15/2021 8:24 AM  Performed by: Verita Lamb, CRNAPre-anesthesia Checklist: Patient identified, Emergency Drugs available, Suction available and Patient being monitored Patient Re-evaluated:Patient Re-evaluated prior to induction Oxygen Delivery Method: Circle system utilized Preoxygenation: Pre-oxygenation with 100% oxygen Induction Type: IV induction Ventilation: Mask ventilation without difficulty LMA: LMA inserted LMA Size: 4.0 Number of attempts: 1 Airway Equipment and Method: Bite block Placement Confirmation: positive ETCO2 Tube secured with: Tape Dental Injury: Teeth and Oropharynx as per pre-operative assessment

## 2021-12-15 NOTE — Discharge Instructions (Addendum)
  Post Anesthesia Home Care Instructions  Activity: Get plenty of rest for the remainder of the day. A responsible individual must stay with you for 24 hours following the procedure.  For the next 24 hours, DO NOT: -Drive a car -Paediatric nurse -Drink alcoholic beverages -Take any medication unless instructed by your physician -Make any legal decisions or sign important papers.  Meals: Start with liquid foods such as gelatin or soup. Progress to regular foods as tolerated. Avoid greasy, spicy, heavy foods. If nausea and/or vomiting occur, drink only clear liquids until the nausea and/or vomiting subsides. Call your physician if vomiting continues.  Special Instructions/Symptoms: Your throat may feel dry or sore from the anesthesia or the breathing tube placed in your throat during surgery. If this causes discomfort, gargle with warm salt water. The discomfort should disappear within 24 hours.  If you had a scopolamine patch placed behind your ear for the management of post- operative nausea and/or vomiting:  1. The medication in the patch is effective for 72 hours, after which it should be removed.  Wrap patch in a tissue and discard in the trash. Wash hands thoroughly with soap and water. 2. You may remove the patch earlier than 72 hours if you experience unpleasant side effects which may include dry mouth, dizziness or visual disturbances. 3. Avoid touching the patch. Wash your hands with soap and water after contact with the patch.      You can unwrap your dressing 1 day after surgery down to the clear plastic that looks like Saran wrap.  Do not remove this.  Will apply Saran wrap with Steri-Strips and an X form underneath and there is normally some blood that looks like grape juice present.  This is sterile secure and you can shower and blot it dry with a towel and then rewrap one of the 2 Ace wraps over the top.  Leave dressing on until you see Dr. Lorin Mercy in 1 week.  Elevate your knee  keeping it above your heart most the time for the first several days and use ice intermittently on and off to help with pain and swelling.  Crutches will be provided and you can use them if needed.

## 2021-12-15 NOTE — Anesthesia Preprocedure Evaluation (Signed)
Anesthesia Evaluation  Patient identified by MRN, date of birth, ID band Patient awake    Reviewed: Allergy & Precautions, NPO status , Patient's Chart, lab work & pertinent test results  Airway Mallampati: II  TM Distance: >3 FB Neck ROM: Full    Dental no notable dental hx.    Pulmonary COPD, Current Smoker and Patient abstained from smoking.,    Pulmonary exam normal breath sounds clear to auscultation       Cardiovascular hypertension, + CAD  Normal cardiovascular exam Rhythm:Regular Rate:Normal     Neuro/Psych negative neurological ROS  negative psych ROS   GI/Hepatic Neg liver ROS, GERD  ,  Endo/Other  negative endocrine ROSdiabetes, Type 2  Renal/GU negative Renal ROS  negative genitourinary   Musculoskeletal  (+) Arthritis , Osteoarthritis,    Abdominal   Peds negative pediatric ROS (+)  Hematology negative hematology ROS (+)   Anesthesia Other Findings   Reproductive/Obstetrics negative OB ROS                             Anesthesia Physical Anesthesia Plan  ASA: 3  Anesthesia Plan: General   Post-op Pain Management: Dilaudid IV   Induction: Intravenous  PONV Risk Score and Plan: 1 and Ondansetron and Treatment may vary due to age or medical condition  Airway Management Planned: LMA  Additional Equipment:   Intra-op Plan:   Post-operative Plan: Extubation in OR  Informed Consent: I have reviewed the patients History and Physical, chart, labs and discussed the procedure including the risks, benefits and alternatives for the proposed anesthesia with the patient or authorized representative who has indicated his/her understanding and acceptance.     Dental advisory given  Plan Discussed with: CRNA  Anesthesia Plan Comments:         Anesthesia Quick Evaluation

## 2021-12-15 NOTE — Interval H&P Note (Signed)
History and Physical Interval Note:  12/15/2021 7:26 AM  Gabriel Gomez  has presented today for surgery, with the diagnosis of right knee medial meniscal tear.  The various methods of treatment have been discussed with the patient and family. After consideration of risks, benefits and other options for treatment, the patient has consented to  Procedure(s): RIGHT KNEE ARTHROSCOPY WITH PARTIAL MEDIAL MENISCECTOMY (Right) as a surgical intervention.  The patient's history has been reviewed, patient examined, no change in status, stable for surgery.  I have reviewed the patient's chart and labs.  Questions were answered to the patient's satisfaction.     [name]

## 2021-12-16 ENCOUNTER — Encounter (HOSPITAL_BASED_OUTPATIENT_CLINIC_OR_DEPARTMENT_OTHER): Payer: Self-pay | Admitting: Orthopaedic Surgery

## 2021-12-17 ENCOUNTER — Other Ambulatory Visit: Payer: Self-pay | Admitting: Physician Assistant

## 2021-12-17 NOTE — Progress Notes (Signed)
error 

## 2021-12-23 ENCOUNTER — Encounter: Payer: Self-pay | Admitting: Orthopaedic Surgery

## 2021-12-23 ENCOUNTER — Other Ambulatory Visit: Payer: Self-pay | Admitting: Physician Assistant

## 2021-12-23 ENCOUNTER — Ambulatory Visit (INDEPENDENT_AMBULATORY_CARE_PROVIDER_SITE_OTHER): Payer: Medicaid Other | Admitting: Orthopaedic Surgery

## 2021-12-23 VITALS — BP 148/63 | HR 66 | Ht 69.0 in | Wt 184.0 lb

## 2021-12-23 DIAGNOSIS — S83241D Other tear of medial meniscus, current injury, right knee, subsequent encounter: Secondary | ICD-10-CM

## 2021-12-23 MED ORDER — OXYCODONE-ACETAMINOPHEN 5-325 MG PO TABS: 1.0000 | ORAL_TABLET | Freq: Four times a day (QID) | ORAL | 0 refills | Status: AC | PRN

## 2021-12-23 NOTE — Progress Notes (Unsigned)
Post-Op Visit Note   Patient: Gabriel Gomez           Date of Birth: Dec 03, 1960           MRN: [id number] Visit Date: 12/23/2021 PCP: Marge Duncans, PA-C   Assessment & Plan: Postop right knee medial compartment chondroplasty.  Meniscus was intact.  He had old partial ACL tear which she was not aware of that was not noted on MRI scan.  Oxycodone additional 30 tablets sent in.  He can begin weightbearing as tolerated with his walker and then progressed to a cane recheck 1 month.  Chief Complaint:  Chief Complaint  Patient presents with   Right Knee - Routine Post Op    12/15/2021 right knee arthroscopy, medial compartment chondroplasty   Visit Diagnoses:  1. Other tear of medial meniscus of right knee, unspecified whether old or current tear, subsequent encounter     Plan: We reviewed MRI photos meniscus was intact.  Medial compartment chondroplasty performed.  Continue anti-inflammatory.  30 tablets sent in for Percocet for postop pain and return in 1 month.  Follow-Up Instructions: Return in about 1 month (around 01/23/2022).   Orders:  No orders of the defined types were placed in this encounter.  Meds ordered this encounter  Medications   oxyCODONE-acetaminophen (PERCOCET) 5-325 MG tablet    Sig: Take 1 tablet by mouth every 6 (six) hours as needed for severe pain.    Dispense:  30 tablet    Refill:  0    Post op pain    Imaging: No results found.  PMFS History: Patient Active Problem List   Diagnosis Date Noted   Right knee meniscal tear 10/21/2021   Body mass index (BMI) 27.0-27.9, adult 08/02/2020   Peripheral polyneuropathy 07/01/2020   Low back pain with sciatica 06/10/2020   Acute laryngopharyngitis 05/21/2020   Lumbar radiculopathy 03/07/2020   Bulging lumbar disc 03/07/2020   Abnormal laboratory test 11/15/2019   Prostate cancer screening 11/15/2019   Need for tetanus, diphtheria, and acellular pertussis (Tdap) vaccine 11/06/2019   Asymptomatic  microscopic hematuria 11/06/2019   Acute left-sided low back pain with left-sided sciatica 11/06/2019   Mixed hyperlipidemia 08/03/2019   Type 2 diabetes mellitus without complication, with long-term current use of insulin ([place]) 08/03/2019   Essential hypertension, benign 08/03/2019   Hyponatremia 05/29/2018   Leukocytosis 05/29/2018   IDDM (insulin dependent diabetes mellitus) 05/29/2018   GERD (gastroesophageal reflux disease) 05/29/2018   Unstable angina ([place]) 05/28/2018   Past Medical History:  Diagnosis Date   Arthritis    COPD (chronic obstructive pulmonary disease) ([place])    Coronary artery disease    Diabetes mellitus without complication (HCC)    GERD (gastroesophageal reflux disease)     Family History  Problem Relation Age of Onset   CAD Father     Past Surgical History:  Procedure Laterality Date   CHONDROPLASTY Right 12/15/2021   Procedure: MEDIAL COMPARTMENT CHONDROPLASTY;  Surgeon: [name], MD;  Location: [facility];  Service: Orthopedics;  Laterality: Right;   JOINT REPLACEMENT Left    KNEE ARTHROSCOPY Right 12/15/2021   Procedure: RIGHT KNEE ARTHROSCOPY;  Surgeon: [name], MD;  Location: [facility];  Service: Orthopedics;  Laterality: Right;   LEFT HEART CATH AND CORONARY ANGIOGRAPHY N/A 05/30/2018   Procedure: LEFT HEART CATH AND CORONARY ANGIOGRAPHY;  Surgeon: Sherren Mocha, MD;  Location: [facility];  Service: Cardiovascular;  Laterality: N/A;   liver laceration  1979   Social History   Occupational History   Not on file  Tobacco Use   Smoking status: Every Day    Packs/day: 1.00    Types: Cigarettes   Smokeless tobacco: Never  Substance and Sexual Activity   Alcohol use: Yes    Comment: social beer   Drug use: Never   Sexual activity: Yes    Partners: Female

## 2021-12-25 ENCOUNTER — Encounter: Payer: Self-pay | Admitting: Orthopaedic Surgery

## 2022-01-20 DIAGNOSIS — S2242XA Multiple fractures of ribs, left side, initial encounter for closed fracture: Secondary | ICD-10-CM | POA: Diagnosis not present

## 2022-01-20 DIAGNOSIS — J984 Other disorders of lung: Secondary | ICD-10-CM | POA: Diagnosis not present

## 2022-01-20 DIAGNOSIS — R0781 Pleurodynia: Secondary | ICD-10-CM | POA: Diagnosis not present

## 2022-01-20 DIAGNOSIS — M79622 Pain in left upper arm: Secondary | ICD-10-CM | POA: Diagnosis not present

## 2022-01-21 ENCOUNTER — Telehealth: Payer: Self-pay

## 2022-01-21 DIAGNOSIS — D485 Neoplasm of uncertain behavior of skin: Secondary | ICD-10-CM | POA: Diagnosis not present

## 2022-01-21 DIAGNOSIS — L578 Other skin changes due to chronic exposure to nonionizing radiation: Secondary | ICD-10-CM | POA: Diagnosis not present

## 2022-01-21 DIAGNOSIS — L820 Inflamed seborrheic keratosis: Secondary | ICD-10-CM | POA: Diagnosis not present

## 2022-01-21 DIAGNOSIS — L814 Other melanin hyperpigmentation: Secondary | ICD-10-CM | POA: Diagnosis not present

## 2022-01-21 DIAGNOSIS — D225 Melanocytic nevi of trunk: Secondary | ICD-10-CM | POA: Diagnosis not present

## 2022-01-21 NOTE — Telephone Encounter (Signed)
Patient called and stated that he went to urgent care yesterday for a fall and x-ray showed that he cracked two ribs. Urgent care said for him to follow up in two days with provider. I put patient on schedule for Tuesday.

## 2022-01-27 ENCOUNTER — Encounter: Payer: Medicaid Other | Admitting: Orthopaedic Surgery

## 2022-01-27 ENCOUNTER — Ambulatory Visit: Payer: Medicaid Other | Admitting: Physician Assistant

## 2022-02-25 DIAGNOSIS — H903 Sensorineural hearing loss, bilateral: Secondary | ICD-10-CM

## 2022-02-25 HISTORY — DX: Sensorineural hearing loss, bilateral: H90.3

## 2022-03-10 ENCOUNTER — Other Ambulatory Visit: Payer: Self-pay | Admitting: Physician Assistant

## 2022-03-11 DIAGNOSIS — J988 Other specified respiratory disorders: Secondary | ICD-10-CM | POA: Insufficient documentation

## 2022-03-11 HISTORY — DX: Other specified respiratory disorders: J98.8

## 2022-04-08 ENCOUNTER — Ambulatory Visit: Payer: Medicaid Other | Admitting: Physician Assistant

## 2022-04-21 DIAGNOSIS — E782 Mixed hyperlipidemia: Secondary | ICD-10-CM | POA: Insufficient documentation

## 2022-04-21 DIAGNOSIS — E1142 Type 2 diabetes mellitus with diabetic polyneuropathy: Secondary | ICD-10-CM | POA: Insufficient documentation

## 2022-04-21 DIAGNOSIS — R109 Unspecified abdominal pain: Secondary | ICD-10-CM

## 2022-04-21 HISTORY — DX: Mixed hyperlipidemia: E78.2

## 2022-04-21 HISTORY — DX: Unspecified abdominal pain: R10.9

## 2022-04-21 HISTORY — DX: Type 2 diabetes mellitus with diabetic polyneuropathy: E11.42

## 2022-04-23 DIAGNOSIS — H6993 Unspecified Eustachian tube disorder, bilateral: Secondary | ICD-10-CM | POA: Insufficient documentation

## 2022-04-23 DIAGNOSIS — I639 Cerebral infarction, unspecified: Secondary | ICD-10-CM | POA: Insufficient documentation

## 2022-04-23 HISTORY — DX: Unspecified eustachian tube disorder, bilateral: H69.93

## 2022-04-23 HISTORY — DX: Cerebral infarction, unspecified: I63.9

## 2022-04-27 DIAGNOSIS — R3915 Urgency of urination: Secondary | ICD-10-CM

## 2022-04-27 DIAGNOSIS — R300 Dysuria: Secondary | ICD-10-CM

## 2022-04-27 HISTORY — DX: Dysuria: R30.0

## 2022-04-27 HISTORY — DX: Urgency of urination: R39.15

## 2022-05-11 DIAGNOSIS — R0781 Pleurodynia: Secondary | ICD-10-CM | POA: Insufficient documentation

## 2022-05-11 HISTORY — DX: Pleurodynia: R07.81

## 2022-05-27 ENCOUNTER — Other Ambulatory Visit: Payer: Self-pay | Admitting: Physician Assistant

## 2022-06-24 ENCOUNTER — Other Ambulatory Visit: Payer: Self-pay | Admitting: Physician Assistant

## 2022-07-02 ENCOUNTER — Other Ambulatory Visit: Payer: Self-pay | Admitting: Physician Assistant

## 2022-08-06 ENCOUNTER — Other Ambulatory Visit: Payer: Self-pay | Admitting: Physician Assistant

## 2022-09-22 ENCOUNTER — Other Ambulatory Visit: Payer: Self-pay | Admitting: Physician Assistant

## 2022-09-22 DIAGNOSIS — J060 Acute laryngopharyngitis: Secondary | ICD-10-CM

## 2022-09-25 DIAGNOSIS — M25511 Pain in right shoulder: Secondary | ICD-10-CM | POA: Insufficient documentation

## 2022-09-25 HISTORY — DX: Pain in right shoulder: M25.511

## 2022-10-01 DIAGNOSIS — I739 Peripheral vascular disease, unspecified: Secondary | ICD-10-CM

## 2022-10-01 HISTORY — DX: Peripheral vascular disease, unspecified: I73.9

## 2023-01-07 DIAGNOSIS — G8929 Other chronic pain: Secondary | ICD-10-CM | POA: Insufficient documentation

## 2023-01-07 HISTORY — DX: Other chronic pain: G89.29

## 2023-01-25 ENCOUNTER — Other Ambulatory Visit: Payer: Self-pay | Admitting: Family Medicine

## 2023-06-29 DIAGNOSIS — E559 Vitamin D deficiency, unspecified: Secondary | ICD-10-CM

## 2023-06-29 HISTORY — DX: Hypomagnesemia: E83.42

## 2023-06-29 HISTORY — DX: Vitamin D deficiency, unspecified: E55.9

## 2023-07-06 DIAGNOSIS — R2689 Other abnormalities of gait and mobility: Secondary | ICD-10-CM | POA: Insufficient documentation

## 2023-07-06 DIAGNOSIS — R269 Unspecified abnormalities of gait and mobility: Secondary | ICD-10-CM | POA: Insufficient documentation

## 2023-07-06 DIAGNOSIS — R29898 Other symptoms and signs involving the musculoskeletal system: Secondary | ICD-10-CM | POA: Insufficient documentation

## 2023-07-06 HISTORY — DX: Other symptoms and signs involving the musculoskeletal system: R29.898

## 2023-07-06 HISTORY — DX: Unspecified abnormalities of gait and mobility: R26.9

## 2023-07-06 HISTORY — DX: Other abnormalities of gait and mobility: R26.89

## 2023-10-02 ENCOUNTER — Other Ambulatory Visit: Payer: Self-pay

## 2023-10-02 ENCOUNTER — Emergency Department (HOSPITAL_COMMUNITY)

## 2023-10-02 ENCOUNTER — Observation Stay (HOSPITAL_COMMUNITY)
Admission: EM | Admit: 2023-10-02 | Discharge: 2023-10-03 | Discharge status: Against Medical Advice | Attending: Family Medicine | Admitting: Family Medicine

## 2023-10-02 DIAGNOSIS — Z8673 Personal history of transient ischemic attack (TIA), and cerebral infarction without residual deficits: Secondary | ICD-10-CM | POA: Diagnosis not present

## 2023-10-02 DIAGNOSIS — I498 Other specified cardiac arrhythmias: Secondary | ICD-10-CM | POA: Insufficient documentation

## 2023-10-02 DIAGNOSIS — Z794 Long term (current) use of insulin: Secondary | ICD-10-CM | POA: Diagnosis not present

## 2023-10-02 DIAGNOSIS — S93402A Sprain of unspecified ligament of left ankle, initial encounter: Secondary | ICD-10-CM | POA: Insufficient documentation

## 2023-10-02 DIAGNOSIS — E1149 Type 2 diabetes mellitus with other diabetic neurological complication: Secondary | ICD-10-CM

## 2023-10-02 DIAGNOSIS — I152 Hypertension secondary to endocrine disorders: Secondary | ICD-10-CM | POA: Insufficient documentation

## 2023-10-02 DIAGNOSIS — Z87891 Personal history of nicotine dependence: Secondary | ICD-10-CM | POA: Diagnosis not present

## 2023-10-02 DIAGNOSIS — Z79899 Other long term (current) drug therapy: Secondary | ICD-10-CM | POA: Diagnosis not present

## 2023-10-02 DIAGNOSIS — I951 Orthostatic hypotension: Secondary | ICD-10-CM

## 2023-10-02 DIAGNOSIS — I480 Paroxysmal atrial fibrillation: Secondary | ICD-10-CM | POA: Diagnosis not present

## 2023-10-02 DIAGNOSIS — Z5321 Procedure and treatment not carried out due to patient leaving prior to being seen by health care provider: Secondary | ICD-10-CM | POA: Insufficient documentation

## 2023-10-02 DIAGNOSIS — E876 Hypokalemia: Secondary | ICD-10-CM

## 2023-10-02 DIAGNOSIS — I2510 Atherosclerotic heart disease of native coronary artery without angina pectoris: Secondary | ICD-10-CM | POA: Diagnosis not present

## 2023-10-02 DIAGNOSIS — J449 Chronic obstructive pulmonary disease, unspecified: Secondary | ICD-10-CM | POA: Insufficient documentation

## 2023-10-02 DIAGNOSIS — I442 Atrioventricular block, complete: Secondary | ICD-10-CM

## 2023-10-02 DIAGNOSIS — Z7901 Long term (current) use of anticoagulants: Secondary | ICD-10-CM | POA: Diagnosis not present

## 2023-10-02 DIAGNOSIS — W19XXXA Unspecified fall, initial encounter: Secondary | ICD-10-CM | POA: Insufficient documentation

## 2023-10-02 DIAGNOSIS — E1159 Type 2 diabetes mellitus with other circulatory complications: Secondary | ICD-10-CM | POA: Insufficient documentation

## 2023-10-02 DIAGNOSIS — I739 Peripheral vascular disease, unspecified: Secondary | ICD-10-CM | POA: Insufficient documentation

## 2023-10-02 DIAGNOSIS — R55 Syncope and collapse: Principal | ICD-10-CM | POA: Diagnosis present

## 2023-10-02 DIAGNOSIS — I10 Essential (primary) hypertension: Secondary | ICD-10-CM | POA: Diagnosis not present

## 2023-10-02 DIAGNOSIS — Z789 Other specified health status: Secondary | ICD-10-CM

## 2023-10-02 DIAGNOSIS — G8929 Other chronic pain: Secondary | ICD-10-CM | POA: Diagnosis not present

## 2023-10-02 DIAGNOSIS — M79672 Pain in left foot: Secondary | ICD-10-CM | POA: Insufficient documentation

## 2023-10-02 HISTORY — DX: Syncope and collapse: R55

## 2023-10-02 HISTORY — DX: Sprain of unspecified ligament of left ankle, initial encounter: S93.402A

## 2023-10-02 HISTORY — DX: Hypokalemia: E87.6

## 2023-10-02 HISTORY — DX: Other specified health status: Z78.9

## 2023-10-02 HISTORY — DX: Hypertension secondary to endocrine disorders: I15.2

## 2023-10-02 HISTORY — DX: Atrioventricular block, complete: I44.2

## 2023-10-02 HISTORY — DX: Type 2 diabetes mellitus with other diabetic neurological complication: E11.49

## 2023-10-02 LAB — URINALYSIS, ROUTINE W REFLEX MICROSCOPIC
Bacteria, UA: NONE SEEN
Bilirubin Urine: NEGATIVE
Glucose, UA: 50 mg/dL — AB
Hgb urine dipstick: NEGATIVE
Ketones, ur: NEGATIVE mg/dL
Leukocytes,Ua: NEGATIVE
Nitrite: NEGATIVE
Protein, ur: 100 mg/dL — AB
Specific Gravity, Urine: 1.005 (ref 1.005–1.030)
pH: 7 (ref 5.0–8.0)

## 2023-10-02 LAB — CBC WITH DIFFERENTIAL/PLATELET
Abs Immature Granulocytes: 0.05 10*3/uL (ref 0.00–0.07)
Basophils Absolute: 0.1 10*3/uL (ref 0.0–0.1)
Basophils Relative: 1 %
Eosinophils Absolute: 0.2 10*3/uL (ref 0.0–0.5)
Eosinophils Relative: 2 %
HCT: 34.2 % — ABNORMAL LOW (ref 39.0–52.0)
Hemoglobin: 11.9 g/dL — ABNORMAL LOW (ref 13.0–17.0)
Immature Granulocytes: 1 %
Lymphocytes Relative: 29 %
Lymphs Abs: 2.8 10*3/uL (ref 0.7–4.0)
MCH: 32.6 pg (ref 26.0–34.0)
MCHC: 34.8 g/dL (ref 30.0–36.0)
MCV: 93.7 fL (ref 80.0–100.0)
Monocytes Absolute: 0.7 10*3/uL (ref 0.1–1.0)
Monocytes Relative: 8 %
Neutro Abs: 5.8 10*3/uL (ref 1.7–7.7)
Neutrophils Relative %: 59 %
Platelets: 321 10*3/uL (ref 150–400)
RBC: 3.65 MIL/uL — ABNORMAL LOW (ref 4.22–5.81)
RDW: 12 % (ref 11.5–15.5)
WBC: 9.6 10*3/uL (ref 4.0–10.5)
nRBC: 0 % (ref 0.0–0.2)

## 2023-10-02 LAB — I-STAT CHEM 8, ED
BUN: 12 mg/dL (ref 8–23)
Calcium, Ion: 1.18 mmol/L (ref 1.15–1.40)
Chloride: 101 mmol/L (ref 98–111)
Creatinine, Ser: 1.3 mg/dL — ABNORMAL HIGH (ref 0.61–1.24)
Glucose, Bld: 120 mg/dL — ABNORMAL HIGH (ref 70–99)
HCT: 36 % — ABNORMAL LOW (ref 39.0–52.0)
Hemoglobin: 12.2 g/dL — ABNORMAL LOW (ref 13.0–17.0)
Potassium: 3.3 mmol/L — ABNORMAL LOW (ref 3.5–5.1)
Sodium: 139 mmol/L (ref 135–145)
TCO2: 25 mmol/L (ref 22–32)

## 2023-10-02 LAB — COMPREHENSIVE METABOLIC PANEL WITH GFR
ALT: 19 U/L (ref 0–44)
AST: 16 U/L (ref 15–41)
Albumin: 3 g/dL — ABNORMAL LOW (ref 3.5–5.0)
Alkaline Phosphatase: 67 U/L (ref 38–126)
Anion gap: 10 (ref 5–15)
BUN: 11 mg/dL (ref 8–23)
CO2: 24 mmol/L (ref 22–32)
Calcium: 8.5 mg/dL — ABNORMAL LOW (ref 8.9–10.3)
Chloride: 103 mmol/L (ref 98–111)
Creatinine, Ser: 1.21 mg/dL (ref 0.61–1.24)
GFR, Estimated: >60 mL/min (ref >60)
Glucose, Bld: 122 mg/dL — ABNORMAL HIGH (ref 70–99)
Potassium: 3.2 mmol/L — ABNORMAL LOW (ref 3.5–5.1)
Sodium: 137 mmol/L (ref 135–145)
Total Bilirubin: 0.6 mg/dL (ref 0.0–1.2)
Total Protein: 5.6 g/dL — ABNORMAL LOW (ref 6.5–8.1)

## 2023-10-02 LAB — CBG MONITORING, ED: Glucose-Capillary: 110 mg/dL — ABNORMAL HIGH (ref 70–99)

## 2023-10-02 LAB — TROPONIN I (HIGH SENSITIVITY)
Troponin I (High Sensitivity): 8 ng/L (ref <18)
Troponin I (High Sensitivity): 9 ng/L (ref <18)

## 2023-10-02 LAB — I-STAT CG4 LACTIC ACID, ED: Lactic Acid, Venous: 0.6 mmol/L (ref 0.5–1.9)

## 2023-10-02 MED ORDER — ATORVASTATIN CALCIUM 80 MG PO TABS
80.0000 mg | ORAL_TABLET | Freq: Every day | ORAL | Status: DC
  Administered 2023-10-03: 80 mg via ORAL
  Filled 2023-10-02: qty 1

## 2023-10-02 MED ORDER — ACETAMINOPHEN 325 MG PO TABS
650.0000 mg | ORAL_TABLET | Freq: Four times a day (QID) | ORAL | Status: DC
  Administered 2023-10-02 – 2023-10-03 (×2): 650 mg via ORAL
  Filled 2023-10-02 (×2): qty 2

## 2023-10-02 MED ORDER — INSULIN GLARGINE-YFGN 100 UNIT/ML ~~LOC~~ SOLN
25.0000 [IU] | Freq: Every day | SUBCUTANEOUS | Status: DC
  Administered 2023-10-03: 25 [IU] via SUBCUTANEOUS
  Filled 2023-10-02: qty 0.25

## 2023-10-02 MED ORDER — DULOXETINE HCL 60 MG PO CPEP
60.0000 mg | ORAL_CAPSULE | Freq: Every day | ORAL | Status: DC
  Administered 2023-10-03: 60 mg via ORAL
  Filled 2023-10-02: qty 1

## 2023-10-02 MED ORDER — ALBUTEROL SULFATE (2.5 MG/3ML) 0.083% IN NEBU: 3.0000 mL | INHALATION_SOLUTION | Freq: Four times a day (QID) | RESPIRATORY_TRACT | Status: DC | PRN

## 2023-10-02 MED ORDER — METOPROLOL TARTRATE 25 MG PO TABS: 25.0000 mg | ORAL_TABLET | Freq: Two times a day (BID) | ORAL | Status: DC

## 2023-10-02 MED ORDER — OXYCODONE-ACETAMINOPHEN 5-325 MG PO TABS
1.0000 | ORAL_TABLET | Freq: Once | ORAL | Status: AC
  Administered 2023-10-02: 1 via ORAL
  Filled 2023-10-02: qty 1

## 2023-10-02 MED ORDER — PANTOPRAZOLE SODIUM 40 MG PO TBEC
40.0000 mg | DELAYED_RELEASE_TABLET | Freq: Every day | ORAL | Status: DC
  Administered 2023-10-03: 40 mg via ORAL
  Filled 2023-10-02: qty 1

## 2023-10-02 MED ORDER — APIXABAN 5 MG PO TABS
5.0000 mg | ORAL_TABLET | Freq: Two times a day (BID) | ORAL | Status: DC
  Administered 2023-10-02 – 2023-10-03 (×2): 5 mg via ORAL
  Filled 2023-10-02 (×2): qty 1

## 2023-10-02 MED ORDER — SODIUM CHLORIDE 0.9 % IV BOLUS
500.0000 mL | Freq: Once | INTRAVENOUS | Status: AC
  Administered 2023-10-02: 500 mL via INTRAVENOUS

## 2023-10-02 MED ORDER — INSULIN ASPART 100 UNIT/ML IJ SOLN
0.0000 [IU] | Freq: Three times a day (TID) | INTRAMUSCULAR | Status: DC
  Administered 2023-10-03: 3 [IU] via SUBCUTANEOUS
  Administered 2023-10-03: 7 [IU] via SUBCUTANEOUS
  Administered 2023-10-03: 3 [IU] via SUBCUTANEOUS

## 2023-10-02 MED ORDER — METOPROLOL TARTRATE 25 MG PO TABS
25.0000 mg | ORAL_TABLET | Freq: Two times a day (BID) | ORAL | Status: DC
  Administered 2023-10-02: 25 mg via ORAL
  Filled 2023-10-02: qty 1

## 2023-10-02 NOTE — Assessment & Plan Note (Signed)
 Per cardiology this is typically a benign rhythm that would not cause syncope.  But can be an indication of ischemia.  Reassuringly troponins trended flat and patient is not having symptoms of ACS. -Cardiology consulted, recommendations appreciated -Cardiac CT

## 2023-10-02 NOTE — ED Notes (Signed)
 Pt assisted to put on blue scrub pants.

## 2023-10-02 NOTE — Consult Note (Signed)
 . Cardiology Admission History and Physical:   Patient ID: Gabriel Gomez MRN: [id number]; DOB: 1961/02/24   Admission date: 10/02/2023  Primary Care Provider: Patient, No Pcp Per Primary Cardiologist: Hazle Lites, MD  Primary Electrophysiologist:  None   Chief Complaint:   Wide complex rhythm  Patient Profile:   Gabriel Gomez is a 63 y.o. male with underlying diabetes, dyslipidemia, hypertension, prior CVA, paroxysmal atrial fibrillation  History of Present Illness:   Gabriel Gomez presents to the emergency room with his daughter and his wife. He states that he stood up today and fainted within a few minutes of standing. This is his second syncopal episode in last 48 hours.  Both under the same circumstances. He was recently admitted in [place] for a GI illness to which she had a lot of vomiting and as his family would describe some irritation of his esophagus. At that point in time he had a transient episode of atrial fibrillation that lasted for a few minutes. However he was started on anticoagulation given had an unexplained stroke in his past. During that admission he had an echo which showed normal LVEF, no regional wall motion abnormalities. Since his discharge he has had changes to his blood pressure medications at assist with his hypertension.  He states that for the last few weeks he has been having significant lightheadedness upon standing. He has also noticed his blood pressure dropping with standing.  While in the emergency department he went into a wide-complex rhythm at a rate of 66 bpm. Due to his rhythm and his syncopal episode cardiology was consulted for diagnosis and management. Of note, he states that after syncopal episode today he did have some chest pain. But he had not had chest pain prior to today. Occasionally gets lower extremity swelling.  He does not experience shortness of breath with exertion.   Past Medical History:  Diagnosis Date   Arthritis    COPD  (chronic obstructive pulmonary disease) (HCC)    Coronary artery disease    Diabetes mellitus without complication (HCC)    GERD (gastroesophageal reflux disease)     Past Surgical History:  Procedure Laterality Date   CHONDROPLASTY Right 12/15/2021   Procedure: MEDIAL COMPARTMENT CHONDROPLASTY;  Surgeon: Adah Acron, MD;  Location: [facility];  Service: Orthopedics;  Laterality: Right;   JOINT REPLACEMENT Left    KNEE ARTHROSCOPY Right 12/15/2021   Procedure: RIGHT KNEE ARTHROSCOPY;  Surgeon: Adah Acron, MD;  Location: [facility];  Service: Orthopedics;  Laterality: Right;   LEFT HEART CATH AND CORONARY ANGIOGRAPHY N/A 05/30/2018   Procedure: LEFT HEART CATH AND CORONARY ANGIOGRAPHY;  Surgeon: Arnoldo Lapping, MD;  Location: [facility];  Service: Cardiovascular;  Laterality: N/A;   liver laceration  1979     Medications Prior to Admission: Prior to Admission medications   Medication Sig Start Date End Date Taking? Authorizing Provider  ACCU-CHEK GUIDE test strip USE ONE strip FOUR TIMES DAILY 06/24/22   Cyndi Drain, PA-C  Accu-Chek Softclix Lancets lancets Use as instructed 06/25/21   Cyndi Drain, PA-C  albuterol  (VENTOLIN  HFA) 108 (90 Base) MCG/ACT inhaler Inhale 2 puffs into the lungs every 6 (six) hours as needed for wheezing or shortness of breath. 09/23/22   CoxBurleigh Carp, MD  atorvastatin  (LIPITOR ) 80 MG tablet TAKE ONE TABLET BY MOUTH ONCE DAILY AT 6pm 12/04/21   Cyndi Drain, PA-C  Blood Glucose Monitoring Suppl (ACCU-CHEK GUIDE) w/Device KIT To use to check glucose qd  06/16/21   Cyndi Drain, PA-C  cyclobenzaprine  (FLEXERIL ) 5 MG tablet 1 po bid prn 12/10/21   Cyndi Drain, PA-C  DULoxetine (CYMBALTA) 60 MG capsule Take 60 mg by mouth daily. 08/04/21   [provider]  furosemide  (LASIX ) 20 MG tablet Take 1 tablet (20 mg total) by mouth daily. 08/01/21   Allegra Arch, NP  LANTUS  SOLOSTAR 100 UNIT/ML Solostar Pen Inject 30 Units into the  skin at bedtime. Patient taking differently: 35 Units at bedtime. 10/19/21   Cox, Burleigh Carp, MD  meloxicam  (MOBIC ) 7.5 MG tablet Take 1 tablet (7.5 mg total) by mouth daily. 10/21/21   Adah Acron, MD  metoprolol  tartrate (LOPRESSOR ) 25 MG tablet TAKE ONE TABLET BY MOUTH ONCE DAILY 12/04/21   Cyndi Drain, PA-C  nitroGLYCERIN  (NITROSTAT ) 0.4 MG SL tablet Place 1 tablet (0.4 mg total) under the tongue every 5 (five) minutes x 3 doses as needed for chest pain. 05/30/18   Barrett, Andra Kava, PA-C  NOVOLOG  FLEXPEN 100 UNIT/ML FlexPen inject 15 units EVERY MORNING, 10 units AT LUNCH AND 10 units EVERY EVENING 02/10/21   Davis, Sara, PA-C  omeprazole  (PRILOSEC) 20 MG capsule TAKE ONE CAPSULE BY MOUTH ONCE DAILY AS NEEDED FOR reflux 11/17/21   Cyndi Drain, PA-C  pregabalin  (LYRICA ) 75 MG capsule TAKE ONE CAPSULE BY MOUTH TWICE DAILY 09/03/21   Cyndi Drain, PA-C  ramipril  (ALTACE ) 5 MG capsule TAKE ONE CAPSULE BY MOUTH ONCE DAILY 03/10/22   Cyndi Drain, PA-C  SURE COMFORT PEN NEEDLES 32G X 4 MM MISC AS DIRECTED FOUR TIMES DAILY 01/25/23   Mercy Stall, MD     Allergies:    Allergies  Allergen Reactions   Meloxicam  Swelling    Bil legs   Morphine  Other (See Comments)    Causes severe confusion and aggression   Tape Other (See Comments)    Not an allergy, daughter wants it added because it tears his skin    Social History:   Social History   Socioeconomic History   Marital status: Married    Spouse name: Not on file   Number of children: Not on file   Years of education: Not on file   Highest education level: Not on file  Occupational History   Not on file  Tobacco Use   Smoking status: Every Day    Current packs/day: 1.00    Types: Cigarettes   Smokeless tobacco: Never  Substance and Sexual Activity   Alcohol use: Yes    Comment: social beer   Drug use: Never   Sexual activity: Yes    Partners: Female  Other Topics Concern   Not on file  Social History Narrative   Not on file   Social  Drivers of Health   Financial Resource Strain: Not on file  Food Insecurity: Low Risk  (09/17/2023)   Received from [facility]   Hunger Vital Sign    Worried About Running Out of Food in the Last Year: Never true    Ran Out of Food in the Last Year: Never true  Transportation Needs: No Transportation Needs (09/17/2023)   Received from [facility]    In the past 12 months, has lack of reliable transportation kept you from medical appointments, meetings, work or from getting things needed for daily living? : No  Physical Activity: Not on file  Stress: Not on file  Social Connections: Not on file  Intimate Partner Violence: Not on file    Family History:  The patient's family history includes CAD in his father.    Review of Systems: [y] = yes, [ ]  = no    General: Weight gain [ ] ; Weight loss [ ] ; Anorexia [ ] ; Fatigue [ ] ; Fever [ ] ; Chills [ ] ; Weakness [ ]   Cardiac: Chest pain/pressure [ ] ; Resting SOB [ ] ; Exertional SOB [ ] ; Orthopnea [ ] ; Pedal Edema [ ] ; Palpitations [ ] ; Syncope [ y ]; Presyncope [ y ]; Paroxysmal nocturnal dyspnea[ ]   Pulmonary: Cough [ ] ; Wheezing[ ] ; Hemoptysis[ ] ; Sputum [ ] ; Snoring [ ]   GI: Vomiting[ ] ; Dysphagia[ ] ; Melena[ ] ; Hematochezia [ ] ; Heartburn[ ] ; Abdominal pain [ ] ; Constipation [ ] ; Diarrhea [ ] ; BRBPR [ ]   GU: Hematuria[ ] ; Dysuria [ ] ; Nocturia[ ]   Vascular: Pain in legs with walking [ ] ; Pain in feet with lying flat [ ] ; Non-healing sores [ ] ; Stroke [ ] ; TIA [ ] ; Slurred speech [ ] ;  Neuro: Headaches[ ] ; Vertigo[ ] ; Seizures[ ] ; Paresthesias[ ] ;Blurred vision [ ] ; Diplopia [ ] ; Vision changes [ ]   Ortho/Skin: Arthritis [ ] ; Joint pain [ ] ; Muscle pain [ ] ; Joint swelling [ ] ; Back Pain [ ] ; Rash [ ]   Psych: Depression[ ] ; Anxiety[ ]   Heme: Bleeding problems [ ] ; Clotting disorders [ ] ; Anemia [ ]   Endocrine: Diabetes [ ] ; Thyroid dysfunction[ ]   Physical Exam/Data:   Vitals:   10/02/23 1816 10/02/23 1818 10/02/23  1845 10/02/23 1900  BP: (!) 151/76 (!) 110/52 (!) 173/89 (!) 199/80  Pulse: 64 64 60 62  Resp: (!) 21 20 13 13   Temp:      TempSrc:      SpO2: 100% 100% 100% 100%  Weight:      Height:       No intake or output data in the 24 hours ending 10/02/23 1948 Filed Weights   10/02/23 1511  Weight: 81.6 kg   Body mass index is 26.58 kg/m.  General:  Well nourished, well developed, in no acute distress.  Diagonal ear sinus present HEENT: normal Lymph: no adenopathy Neck: no JVD Endocrine:  No thryomegaly Vascular: Diminished pedal pulses Cardiac:  normal S1, S2; RRR; no murmur  Lungs:  clear to auscultation bilaterally, no wheezing, rhonchi or rales  Abd: soft, nontender, no hepatomegaly  Ext: no edema Musculoskeletal:  No deformities, BUE and BLE strength normal and equal Skin: warm and dry  Psych:  Normal affect    EKG:  EKG 10/02/2023 at 18:41- Accelerated idioventricular rhythm at 66 bpm. Both EKGs completed at 18:41 without significant changes. Personally reviewed.   EKG 10/02/2023 at 16:42- Sinus rhythm with LAFB and iRBBB. No ST segment changes or TWI. Personally reviewed.   Relevant CV Studies: January 2020 left heart catheterization Prox RCA lesion is 30% stenosed. Mid RCA lesion is 30% stenosed. Dist RCA lesion is 40% stenosed. Dist LM to Ost LAD lesion is 40% stenosed. Prox LAD lesion is 40% stenosed. Ost 1st Diag lesion is 30% stenosed. Prox Cx to Mid Cx lesion is 40% stenosed. Post Atrio lesion is 100% stenosed  February 2023Echo GLS -16.3. Left ventricular ejection fraction, by estimation, is 60 to  65%. The left ventricle has normal function. The left ventricle has no  regional wall motion abnormalities. Left ventricular diastolic parameters  are consistent with Grade II  diastolic dysfunction (pseudonormalization).   2. Right ventricular systolic function is normal. The right ventricular  size is normal.   3. The mitral valve is  normal in structure. No  evidence of mitral valve  regurgitation. No evidence of mitral stenosis.   4. Mild calcifications of non coroary cusp with no stenosis.. The aortic  valve is normal in structure. There is mild calcification of the aortic  valve. There is mild thickening of the aortic valve. Aortic valve  regurgitation is not visualized. No aortic   stenosis is present.   5. The inferior vena cava is normal in size with greater than 50%  respiratory variability, suggesting right atrial pressure of 3 mmHg.   Laboratory Data:  Chemistry Recent Labs  Lab 10/02/23 1548 10/02/23 1701  NA 137 139  K 3.2* 3.3*  CL 103 101  CO2 24  --   GLUCOSE 122* 120*  BUN 11 12  CREATININE 1.21 1.30*  CALCIUM  8.5*  --   GFRNONAA >60  --   ANIONGAP 10  --     Recent Labs  Lab 10/02/23 1548  PROT 5.6*  ALBUMIN 3.0*  AST 16  ALT 19  ALKPHOS 67  BILITOT 0.6   Hematology Recent Labs  Lab 10/02/23 1548 10/02/23 1701  WBC 9.6  --   RBC 3.65*  --   HGB 11.9* 12.2*  HCT 34.2* 36.0*  MCV 93.7  --   MCH 32.6  --   MCHC 34.8  --   RDW 12.0  --   PLT 321  --    Cardiac Enzymes hstroponin 8,9     Radiology/Studies:  DG Ankle Complete Left Result Date: 10/02/2023 CLINICAL DATA:  Fell, pain, swelling EXAM: LEFT ANKLE COMPLETE - 3+ VIEW; LEFT FOOT - COMPLETE 3+ VIEW COMPARISON:  None Available. FINDINGS: Left ankle: Frontal, oblique, and lateral views of the left ankle are obtained. Prior proximal fibular resection. No acute displaced fracture, subluxation, or dislocation. Joint spaces are well preserved. Soft tissues are unremarkable. Left foot: Frontal, oblique, and lateral views are obtained. No acute displaced fracture, subluxation, or dislocation. Moderate osteoarthritis of the first metatarsophalangeal joint. Mild osteoarthritis throughout the interphalangeal joints. Soft tissues are unremarkable. IMPRESSION: 1. 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 2. No acute  displaced fractures. Electronically Signed   By: Bobbye Burrow M.D.   On: 10/02/2023 17:09   DG Foot Complete Left Result Date: 10/02/2023 CLINICAL DATA:  Marvell Slider, pain, swelling EXAM: LEFT ANKLE COMPLETE - 3+ VIEW; LEFT FOOT - COMPLETE 3+ VIEW COMPARISON:  None Available. FINDINGS: Left ankle: Frontal, oblique, and lateral views of the left ankle are obtained. Prior proximal fibular resection. No acute displaced fracture, subluxation, or dislocation. Joint spaces are well preserved. Soft tissues are unremarkable. Left foot: Frontal, oblique, and lateral views are obtained. No acute displaced fracture, subluxation, or dislocation. Moderate osteoarthritis of the first metatarsophalangeal joint. Mild osteoarthritis throughout the interphalangeal joints. Soft tissues are unremarkable. IMPRESSION: 1. 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 2. No acute displaced fractures. Electronically Signed   By: Bobbye Burrow M.D.   On: 10/02/2023 17:09   CT Thoracic Spine Wo Contrast Result Date: 10/02/2023 CLINICAL DATA:  Marvell Slider, back trauma, near syncope EXAM: CT THORACIC SPINE WITHOUT CONTRAST TECHNIQUE: Multidetector CT images of the thoracic were obtained using the standard protocol without intravenous contrast. RADIATION DOSE REDUCTION: This exam was performed according to the departmental dose-optimization program which includes automated exposure control, adjustment of the mA and/or kV according to patient size and/or use of iterative reconstruction technique. COMPARISON:  11/30/2022 FINDINGS: Alignment: Alignment is anatomic. Vertebrae: No acute fracture or focal pathologic  process. Paraspinal and other soft tissues: Paraspinal soft tissues are unremarkable. Dependent hypoventilatory changes are seen within the lung bases. Disc levels: No significant spondylosis or facet hypertrophy. No bony encroachment upon the central canal or neural foramina. Reconstructed images demonstrate  no additional findings. IMPRESSION: 1. No acute thoracic spine fracture. Electronically Signed   By: Bobbye Burrow M.D.   On: 10/02/2023 16:49   CT CERVICAL SPINE WO CONTRAST Result Date: 10/02/2023 CLINICAL DATA:  Marvell Slider, neck trauma EXAM: CT CERVICAL SPINE WITHOUT CONTRAST TECHNIQUE: Multidetector CT imaging of the cervical spine was performed without intravenous contrast. Multiplanar CT image reconstructions were also generated. RADIATION DOSE REDUCTION: This exam was performed according to the departmental dose-optimization program which includes automated exposure control, adjustment of the mA and/or kV according to patient size and/or use of iterative reconstruction technique. COMPARISON:  None Available. FINDINGS: Alignment: Alignment is anatomic. Skull base and vertebrae: No acute fracture. No primary bone lesion or focal pathologic process. Soft tissues and spinal canal: No prevertebral fluid or swelling. No visible canal hematoma. Disc levels: Mild multilevel spondylosis most pronounced at C5-6 and C6-7. Upper chest: Airway is patent.  Lung apices are clear. Other: Reconstructed images demonstrate no additional findings. IMPRESSION: 1. No acute cervical spine fracture. Electronically Signed   By: Bobbye Burrow M.D.   On: 10/02/2023 16:35   CT HEAD WO CONTRAST Result Date: 10/02/2023 CLINICAL DATA:  Marvell Slider, near syncope, head trauma EXAM: CT HEAD WITHOUT CONTRAST TECHNIQUE: Contiguous axial images were obtained from the base of the skull through the vertex without intravenous contrast. RADIATION DOSE REDUCTION: This exam was performed according to the departmental dose-optimization program which includes automated exposure control, adjustment of the mA and/or kV according to patient size and/or use of iterative reconstruction technique. COMPARISON:  05/28/2018 FINDINGS: Brain: No acute infarct or hemorrhage. Lateral ventricles and midline structures are unremarkable. No acute extra-axial fluid  collections. No mass effect. Vascular: No hyperdense vessel or unexpected calcification. Skull: Normal. Negative for fracture or focal lesion. Sinuses/Orbits: Polypoid mucosal thickening versus mucous retention cyst left maxillary sinus. Remaining paranasal sinuses are clear. Other: None. IMPRESSION: 1. No acute intracranial process. Electronically Signed   By: Bobbye Burrow M.D.   On: 10/02/2023 16:33    Assessment and Plan:   Mr. Paule presents to the emergency department room after syncopal episode in the setting of orthostatic hypotension. While in the emergency department he went into an accelerated idioventricular rhythm. AIVR is a benign rhythm and is not likely related to the symptoms he came in with.This rhythm is commonly seen in the setting of ischemia.His coronary angiogram from 2020 did reveal nonobstructive CAD. His baseline EKG is without signs of ischemia. He did have chest pain today, but it was after his syncopal episode. Would recommend Cardiac CT to evaluate for obstructive disease.  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 - Hstroponin 8,9  - Obtain cardiac CT    Signed, Renelda Carry, MD  10/02/2023 7:48 PM

## 2023-10-02 NOTE — Assessment & Plan Note (Signed)
 BPH-hold Flomax History of stroke-not currently on aspirin , likely in the setting of recent esophagitis, and is on Eliquis PAF-continue Eliquis 5 mg twice daily History of smoking-albuterol  as needed Esophagitis-continue Protonix  40 mg daily HLD-continue atorvastatin  80 mg daily

## 2023-10-02 NOTE — ED Notes (Signed)
 Gabriel Gomez, daughter at bedside and requests a phone call when pt is admitted. Contact number has been updated.

## 2023-10-02 NOTE — Assessment & Plan Note (Signed)
 No recent A1c.  Previously controlled.  Home regimen includes Lantus  25 units, NovoLog  15 - 10 - 10 with each meal. - Continue Lantus  25 units - AM A1c - Hold home NovoLog  - Sensitive sliding scale with meals - Hold home pregabalin  for peripheral neuropathy given syncope and concern for sedation - Continue duloxetine 60 mg daily

## 2023-10-02 NOTE — Assessment & Plan Note (Signed)
 Systolically elevated 170s to 190s.  Mildly elevated diastolic.  Notable decrease with standing to 110/52.  - Hold home lisinopril - May continue metoprolol  for now but low threshold to discontinue if symptoms persist with ambulation

## 2023-10-02 NOTE — Assessment & Plan Note (Signed)
 Exam consistent with left ankle sprain. - Lace up ankle brace - PT/OT evaluation tomorrow - Tylenol  as needed for pain - Hold home tramadol  due to sedation

## 2023-10-02 NOTE — H&P (Signed)
 Hospital Admission History and Physical Service Pager: [phone]  Patient name: Gabriel Gomez Medical record number: [id number] Date of Birth: [birth year] Age: 63 y.o. Gender: male  Primary Care Provider: Patient, No Pcp Per Consultants: Cardiology Code Status: Full, which was confirmed with family if patient unable to confirm   Preferred Emergency Contact: Raeanne Bull, daughter, [phone]  Chief Complaint: Syncope  Assessment and Plan: Gabriel Gomez is a 63 y.o. male presenting with syncope episodes. Differential for this patient's presentation of this includes arrhythmia, orthostatic hypotension, vasovagal syncope, seizure, ACS.  In this case patient's syncope is likely secondary to orthostasis in the setting of antihypertensive medication adjustments.  Idioventricular rhythm seen on EKG per cardiology unlikely to cause syncope.  There is no evidence of ACS.  No clear seizure episode history.  He does have a history of atrial fibrillation; however, heart is controlled on has been primarily in sinus rhythm.  There is likely a component of peripheral neuropathy contributing.  Assessment & Plan Syncope and collapse Vital signs stable outside of hypertension noted below.  As above suspect orthostatic cause versus cardiac or neurological. - Admit to family teaching service, attending Dr. Alverna John, med/tele - ECHO - Orthostatics - Continuous cardiac monitoring - Vitals per floor - Consider spot EEG - Hold antihypertensives Hypertension associated with diabetes (HCC) Systolically elevated 170s to 190s.  Mildly elevated diastolic.  Notable decrease with standing to 110/52.  - Hold home lisinopril - May continue metoprolol  for now but low threshold to discontinue if symptoms persist with ambulation Hypokalemia Potassium 3.2. - Replete as indicated Type 2 diabetes mellitus with neurological complications (HCC) No recent A1c.  Previously controlled.  Home regimen includes Lantus   25 units, NovoLog  15 - 10 - 10 with each meal. - Continue Lantus  25 units - AM A1c - Hold home NovoLog  - Sensitive sliding scale with meals - Hold home pregabalin  for peripheral neuropathy given syncope and concern for sedation - Continue duloxetine 60 mg daily Accelerated idioventricular rhythm (HCC) Per cardiology this is typically a benign rhythm that would not cause syncope.  But can be an indication of ischemia.  Reassuringly troponins trended flat and patient is not having symptoms of ACS. -Cardiology consulted, recommendations appreciated -Cardiac CT Left ankle sprain Exam consistent with left ankle sprain. - Lace up ankle brace - PT/OT evaluation tomorrow - Tylenol  as needed for pain - Hold home tramadol  due to sedation Chronic health problem BPH-hold Flomax History of stroke-not currently on aspirin , likely in the setting of recent esophagitis, and is on Eliquis PAF-continue Eliquis 5 mg twice daily History of smoking-albuterol  as needed Esophagitis-continue Protonix  40 mg daily HLD-continue atorvastatin  80 mg daily    FEN/GI: Soft diet VTE Prophylaxis: Eliquis  Disposition: Med tele  History of Present Illness:  Gabriel Gomez is a 63 y.o. male presenting with multiple syncopal episodes.  Reports he got up this morning and was working on his garden and then the back of his neck started to hurt really bad. Remembers going back up to the house but then does not have memory of what else happened.  Went to use the bathroom, got up and passed out. Reports it happened so quick he is not sure what happened, states he unzipped his pants and was out. Daughter had to push the door and get him up. Had another similar episode that occurred yesterday as well. Was in a store walking up to the counter and all the sudden was "on the floor". Does not remember any  symptoms prior to passing out. Unsure if he lost consciousness. When he woke up, someone was helping him up at the store.  Feels like he is staying hydrated, drinking water and gatorade. Unsure if something like this has happened before.   Has been having a lot of issues with swelling in ankle and legs. Daughter helps him with he meds. Feels better than when he came in but his ankle is hurting a lot.  Episode of throwing up blood 2 weeks ago.  Neuropathy in feet really bad. Feels unstable on his feet. Feels like his legs give out, has been falling a lot. Had episode of chills on Thursday night. Denies bladder and bowel incontinence. Has had some episodes of staring off into space.   Daughter states he BP was low earlier in the day and he felt very weak. Recently had he BP meds changed.   Admitted at [facility] on 5/7. Echo showed EF 55-60%, mild LVH. Had CTAP, showed abnormal thickening of esophagus. Diagnosed with esophagitis. Endoscopy showed gray/white discoloration that did not come off, biopsy taken.   Active at baseline per daughter. Walks with a cane.   In the ED, patient was hypertensive with otherwise stable vitals, lab results showed normal troponins, mild hypokalemia, and were otherwise unremarkable.  CT head neck thoracic spine showed no evidence of fracture.  EKG showed idioventricular rhythm, cardiology was consulted.  They recommended cardiac CT.   Review Of Systems: Per HPI   Pertinent Past Medical History: Hypertension GERD CAD Type 2 diabetes, insulin  dependent (A1c 7.7 in 5/6) Hyperlipidemia Chronic low back pain CVA, residual left facial numbness Memory impairment Afib Alcohol use disorder Tobacco use Remainder reviewed in history tab.   Pertinent Past Surgical History: Knee surgery Left heart cath Left joint replacement Remainder reviewed in history tab.   Pertinent Social History: Tobacco use: Current smoker - 3/4 pack, smoking for the past 50 years Alcohol use: None Other Substance use: None Lives with wife  Pertinent Family History: Father CAD  Remainder  reviewed in history tab.   Important Outpatient Medications: Albuterol  Eliquis 5mg  BID Pantoprazole  40mg  daily Sucralfate QID Tramadol  25mg  q6h prn (taking) Lisinopril 40mg  daily Metoprolol  25mg  BID Atorvastatin  80mg  daily Bethanechol 50mg  daily Zyretc  Duloxetine 60mg  daily Novolog  15-10-10 Lantus  25U daily Pregabalin  100mg  BID Tamsulosin 0.4mg  BID  Remainder reviewed in medication history.   Objective: BP (!) 199/80   Pulse 62   Temp 97.8 F (36.6 C) (Oral)   Resp 13   Ht 5\' 9"  (1.753 m)   Wt 81.6 kg   SpO2 100%   BMI 26.58 kg/m  Exam: General: A&O, NAD, lying comfortably in hospital bed  HEENT: No sign of trauma, EOM grossly intact, moist mucous membranes Cardiac: RRR, no m/r/g Respiratory: CTAB, normal WOB, no w/c/r GI: Soft, NTTP, non-distended, no rebound or guarding Extremities: NTTP, no peripheral edema.  Left ankle mild swelling at the medial joint line, no obvious deformity or erythema.  Diffusely tender to palpation along the medial aspect of the ankle including the medial malleoli, ATFL, significant weakness of dorsiflexion and plantarflexion (residual from stroke), 2+ dorsalis pedis pulse Neuro: Cranial nerves II through XII intact, upper extremity strength bilaterally 5 out of 5, right lower extremity 5 out of 5, left lower extremity strength 4/5 moves all four extremities appropriately. Psych: Appropriate mood and affect   Labs:  CBC BMET  Recent Labs  Lab 10/02/23 1548 10/02/23 1701  WBC 9.6  --   HGB 11.9* 12.2*  HCT 34.2* 36.0*  PLT 321  --    Recent Labs  Lab 10/02/23 1548 10/02/23 1701  NA 137 139  K 3.2* 3.3*  CL 103 101  CO2 24  --   BUN 11 12  CREATININE 1.21 1.30*  GLUCOSE 122* 120*  CALCIUM  8.5*  --      Troponin - 8->9 UA - protein 100, glucose 50 Lactic acid - 0.6  EKG: Idioventricular rhythm   Imaging Studies Performed:  CT Head - 1. No acute intracranial process.  CT Neck - 1. No acute cervical spine fracture.   CT Thoracic Spine - 1. No acute thoracic spine fracture.   DG Foot/Ankle Left 1. 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 2. No acute displaced fractures.  Ivin Marrow, MD 10/02/2023, 8:57 PM PGY-2,  Family Medicine  FPTS Intern pager: [phone], text pages welcome Secure chat group [facility] Teaching Service

## 2023-10-02 NOTE — Assessment & Plan Note (Signed)
 Vital signs stable outside of hypertension noted below.  As above suspect orthostatic cause versus cardiac or neurological. - Admit to family teaching service, attending Dr. Alverna John, med/tele - ECHO - Orthostatics - Continuous cardiac monitoring - Vitals per floor - Consider spot EEG - Hold antihypertensives

## 2023-10-02 NOTE — ED Provider Notes (Signed)
 [place] EMERGENCY DEPARTMENT AT [facility] Provider Note   CSN: [id number] Arrival date & time: 10/02/23  1457     History  Chief Complaint  Patient presents with   Altered Mental Status   Fatigue        Near Syncope    Gabriel Gomez is a 63 y.o. male.  Patient is a 63 year old male who presents after a syncopal episode.  He has a history of diabetes, CVA, hypertension, peripheral arterial disease, chronic back pain.  Per chart review, he was admitted to an outside facility from May 4 to May 8 for hematemesis.  He has been on a clear liquid diet since then.  His daughter states that he has been overall weak since that hospitalization and has had some issues of being a little bit more off balance.  They increased his blood pressure per report during this hospitalization and will have episodes where he stands up too fast and they think his blood pressure drops.  He has also been having some increased confusion over the last couple of weeks.  He was out tilling his garden this morning and suddenly felt bad.  He felt like he had to go lay down and felt like he might pass out.  His wife was able to get him into the bed.  He took a little nap and then when he got up he started walking to the bathroom and the family heard a big thump.  They went to check on him and he was on the ground.  He was not very responsive at that time.  When EMS got there he was confused but his mental status is improved and route.  They have not noticed any shaking spells although when he was driving to [place] for a funeral recently, they did notice that he had an episode where he suddenly was not responding.  He was staring off into space.  He was a little confused after this and then got back to his baseline.  He denies any new vision changes.  No new speech deficits.  No new numbness or weakness to his extremities.  He has neuropathy with chronic numbness.  He does say that he hurt his left foot and  ankle during the fall but denies any other injuries.  He says his back hurts and his neck hurts but he is not sure if that is from the fall.  He does complain of a headache but he says that he frequently has headaches.  He does say that he has feelings of his heart racing at times.  Per his daughter, he had an episode of atrial fibrillation during his recent hospitalization.       Home Medications Prior to Admission medications   Medication Sig Start Date End Date Taking? Authorizing Provider  ACCU-CHEK GUIDE test strip USE ONE strip FOUR TIMES DAILY 06/24/22   Cyndi Drain, PA-C  Accu-Chek Softclix Lancets lancets Use as instructed 06/25/21   Cyndi Drain, PA-C  albuterol  (VENTOLIN  HFA) 108 (90 Base) MCG/ACT inhaler Inhale 2 puffs into the lungs every 6 (six) hours as needed for wheezing or shortness of breath. 09/23/22   Cox, Burleigh Carp, MD  atorvastatin  (LIPITOR ) 80 MG tablet TAKE ONE TABLET BY MOUTH ONCE DAILY AT 6pm 12/04/21   Cyndi Drain, PA-C  Blood Glucose Monitoring Suppl (ACCU-CHEK GUIDE) w/Device KIT To use to check glucose qd 06/16/21   Cyndi Drain, PA-C  cyclobenzaprine  (FLEXERIL ) 5 MG tablet 1 po bid prn 12/10/21  Cyndi Drain, PA-C  DULoxetine (CYMBALTA) 60 MG capsule Take 60 mg by mouth daily. 08/04/21   [provider]  furosemide  (LASIX ) 20 MG tablet Take 1 tablet (20 mg total) by mouth daily. 08/01/21   Allegra Arch, NP  LANTUS  SOLOSTAR 100 UNIT/ML Solostar Pen Inject 30 Units into the skin at bedtime. Patient taking differently: 35 Units at bedtime. 10/19/21   CoxBurleigh Carp, MD  meloxicam  (MOBIC ) 7.5 MG tablet Take 1 tablet (7.5 mg total) by mouth daily. 10/21/21   Adah Acron, MD  metoprolol  tartrate (LOPRESSOR ) 25 MG tablet TAKE ONE TABLET BY MOUTH ONCE DAILY 12/04/21   Cyndi Drain, PA-C  nitroGLYCERIN  (NITROSTAT ) 0.4 MG SL tablet Place 1 tablet (0.4 mg total) under the tongue every 5 (five) minutes x 3 doses as needed for chest pain. 05/30/18   Barrett, Andra Kava, PA-C   NOVOLOG  FLEXPEN 100 UNIT/ML FlexPen inject 15 units EVERY MORNING, 10 units AT LUNCH AND 10 units EVERY EVENING 02/10/21   Davis, Sara, PA-C  omeprazole  (PRILOSEC) 20 MG capsule TAKE ONE CAPSULE BY MOUTH ONCE DAILY AS NEEDED FOR reflux 11/17/21   Cyndi Drain, PA-C  pregabalin  (LYRICA ) 75 MG capsule TAKE ONE CAPSULE BY MOUTH TWICE DAILY 09/03/21   Cyndi Drain, PA-C  ramipril  (ALTACE ) 5 MG capsule TAKE ONE CAPSULE BY MOUTH ONCE DAILY 03/10/22   Cyndi Drain, PA-C  SURE COMFORT PEN NEEDLES 32G X 4 MM MISC AS DIRECTED FOUR TIMES DAILY 01/25/23   Mercy Stall, MD      Allergies    Meloxicam , Morphine , and Tape    Review of Systems   Review of Systems  Constitutional:  Positive for fatigue. Negative for chills, diaphoresis and fever.  HENT:  Negative for congestion, rhinorrhea and sneezing.   Eyes: Negative.   Respiratory:  Negative for cough, chest tightness and shortness of breath.   Cardiovascular:  Positive for palpitations. Negative for chest pain and leg swelling.  Gastrointestinal:  Negative for abdominal pain, blood in stool, diarrhea, nausea and vomiting.  Genitourinary:  Negative for difficulty urinating, flank pain, frequency and hematuria.  Musculoskeletal:  Positive for arthralgias, back pain and neck pain.  Skin:  Negative for rash.  Neurological:  Positive for weakness (Generalized) and headaches. Negative for dizziness, speech difficulty and numbness.    Physical Exam Updated Vital Signs BP (!) 199/80   Pulse 62   Temp 97.8 F (36.6 C) (Oral)   Resp 13   Ht 5\' 9"  (1.753 m)   Wt 81.6 kg   SpO2 100%   BMI 26.58 kg/m  Physical Exam Constitutional:      Appearance: He is well-developed.  HENT:     Head: Normocephalic and atraumatic.  Eyes:     Pupils: Pupils are equal, round, and reactive to light.  Neck:     Comments: Positive tenderness to the cervical spine.  There is pain to the lower thoracic spine.  No pain to the lumbosacral spine.  No step-offs or  deformities. Cardiovascular:     Rate and Rhythm: Normal rate and regular rhythm.     Heart sounds: Normal heart sounds.  Pulmonary:     Effort: Pulmonary effort is normal. No respiratory distress.     Breath sounds: Normal breath sounds. No wheezing or rales.  Chest:     Chest wall: No tenderness.  Abdominal:     General: Bowel sounds are normal.     Palpations: Abdomen is soft.     Tenderness: There is no abdominal  tenderness. There is no guarding or rebound.  Musculoskeletal:        General: Normal range of motion.     Comments: Tenderness to the left ankle and left foot.  No obvious deformity.  Minimal swelling.  There is a small abrasion to the dorsum of the foot.  Unable to palpate pedal pulses but his feet are warm bilaterally  Lymphadenopathy:     Cervical: No cervical adenopathy.  Skin:    General: Skin is warm and dry.     Findings: No rash.  Neurological:     General: No focal deficit present.     Mental Status: He is alert and oriented to person, place, and time.     Comments: Overall weak, no facial drooping or slurred speech.  No focal deficits appreciated.     ED Results / Procedures / Treatments   Labs (all labs ordered are listed, but only abnormal results are displayed) Labs Reviewed  COMPREHENSIVE METABOLIC PANEL WITH GFR - Abnormal; Notable for the following components:      Result Value   Potassium 3.2 (*)    Glucose, Bld 122 (*)    Calcium  8.5 (*)    Total Protein 5.6 (*)    Albumin 3.0 (*)    All other components within normal limits  CBC WITH DIFFERENTIAL/PLATELET - Abnormal; Notable for the following components:   RBC 3.65 (*)    Hemoglobin 11.9 (*)    HCT 34.2 (*)    All other components within normal limits  URINALYSIS, ROUTINE W REFLEX MICROSCOPIC - Abnormal; Notable for the following components:   Color, Urine STRAW (*)    Glucose, UA 50 (*)    Protein, ur 100 (*)    All other components within normal limits  CBG MONITORING, ED -  Abnormal; Notable for the following components:   Glucose-Capillary 110 (*)    All other components within normal limits  I-STAT CHEM 8, ED - Abnormal; Notable for the following components:   Potassium 3.3 (*)    Creatinine, Ser 1.30 (*)    Glucose, Bld 120 (*)    Hemoglobin 12.2 (*)    HCT 36.0 (*)    All other components within normal limits  I-STAT CG4 LACTIC ACID, ED  TROPONIN I (HIGH SENSITIVITY)  TROPONIN I (HIGH SENSITIVITY)    EKG EKG Interpretation Date/Time:  Saturday Oct 02 2023 18:41:56 EDT Ventricular Rate:  66 PR Interval:  207 QRS Duration:  180 QT Interval:  515 QTC Calculation: 540 R Axis:   95  Text Interpretation: Complete AV block with wide QRS complex Nonspecific intraventricular conduction delay Confirmed by Hershel Los [phone]) on 10/02/2023 7:25:41 PM  Radiology DG Ankle Complete Left Result Date: 10/02/2023 CLINICAL DATA:  Marvell Slider, pain, swelling EXAM: LEFT ANKLE COMPLETE - 3+ VIEW; LEFT FOOT - COMPLETE 3+ VIEW COMPARISON:  None Available. FINDINGS: Left ankle: Frontal, oblique, and lateral views of the left ankle are obtained. Prior proximal fibular resection. No acute displaced fracture, subluxation, or dislocation. Joint spaces are well preserved. Soft tissues are unremarkable. Left foot: Frontal, oblique, and lateral views are obtained. No acute displaced fracture, subluxation, or dislocation. Moderate osteoarthritis of the first metatarsophalangeal joint. Mild osteoarthritis throughout the interphalangeal joints. Soft tissues are unremarkable. IMPRESSION: 1. 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 2. No acute displaced fractures. Electronically Signed   By: Bobbye Burrow M.D.   On: 10/02/2023 17:09   DG Foot Complete Left Result Date: 10/02/2023 CLINICAL DATA:  Marvell Slider,  pain, swelling EXAM: LEFT ANKLE COMPLETE - 3+ VIEW; LEFT FOOT - COMPLETE 3+ VIEW COMPARISON:  None Available. FINDINGS: Left ankle: Frontal, oblique,  and lateral views of the left ankle are obtained. Prior proximal fibular resection. No acute displaced fracture, subluxation, or dislocation. Joint spaces are well preserved. Soft tissues are unremarkable. Left foot: Frontal, oblique, and lateral views are obtained. No acute displaced fracture, subluxation, or dislocation. Moderate osteoarthritis of the first metatarsophalangeal joint. Mild osteoarthritis throughout the interphalangeal joints. Soft tissues are unremarkable. IMPRESSION: 1. 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 2. No acute displaced fractures. Electronically Signed   By: Bobbye Burrow M.D.   On: 10/02/2023 17:09   CT Thoracic Spine Wo Contrast Result Date: 10/02/2023 CLINICAL DATA:  Marvell Slider, back trauma, near syncope EXAM: CT THORACIC SPINE WITHOUT CONTRAST TECHNIQUE: Multidetector CT images of the thoracic were obtained using the standard protocol without intravenous contrast. RADIATION DOSE REDUCTION: This exam was performed according to the departmental dose-optimization program which includes automated exposure control, adjustment of the mA and/or kV according to patient size and/or use of iterative reconstruction technique. COMPARISON:  11/30/2022 FINDINGS: Alignment: Alignment is anatomic. Vertebrae: No acute fracture or focal pathologic process. Paraspinal and other soft tissues: Paraspinal soft tissues are unremarkable. Dependent hypoventilatory changes are seen within the lung bases. Disc levels: No significant spondylosis or facet hypertrophy. No bony encroachment upon the central canal or neural foramina. Reconstructed images demonstrate no additional findings. IMPRESSION: 1. No acute thoracic spine fracture. Electronically Signed   By: Bobbye Burrow M.D.   On: 10/02/2023 16:49   CT CERVICAL SPINE WO CONTRAST Result Date: 10/02/2023 CLINICAL DATA:  Marvell Slider, neck trauma EXAM: CT CERVICAL SPINE WITHOUT CONTRAST TECHNIQUE: Multidetector CT imaging  of the cervical spine was performed without intravenous contrast. Multiplanar CT image reconstructions were also generated. RADIATION DOSE REDUCTION: This exam was performed according to the departmental dose-optimization program which includes automated exposure control, adjustment of the mA and/or kV according to patient size and/or use of iterative reconstruction technique. COMPARISON:  None Available. FINDINGS: Alignment: Alignment is anatomic. Skull base and vertebrae: No acute fracture. No primary bone lesion or focal pathologic process. Soft tissues and spinal canal: No prevertebral fluid or swelling. No visible canal hematoma. Disc levels: Mild multilevel spondylosis most pronounced at C5-6 and C6-7. Upper chest: Airway is patent.  Lung apices are clear. Other: Reconstructed images demonstrate no additional findings. IMPRESSION: 1. No acute cervical spine fracture. Electronically Signed   By: Bobbye Burrow M.D.   On: 10/02/2023 16:35   CT HEAD WO CONTRAST Result Date: 10/02/2023 CLINICAL DATA:  Marvell Slider, near syncope, head trauma EXAM: CT HEAD WITHOUT CONTRAST TECHNIQUE: Contiguous axial images were obtained from the base of the skull through the vertex without intravenous contrast. RADIATION DOSE REDUCTION: This exam was performed according to the departmental dose-optimization program which includes automated exposure control, adjustment of the mA and/or kV according to patient size and/or use of iterative reconstruction technique. COMPARISON:  05/28/2018 FINDINGS: Brain: No acute infarct or hemorrhage. Lateral ventricles and midline structures are unremarkable. No acute extra-axial fluid collections. No mass effect. Vascular: No hyperdense vessel or unexpected calcification. Skull: Normal. Negative for fracture or focal lesion. Sinuses/Orbits: Polypoid mucosal thickening versus mucous retention cyst left maxillary sinus. Remaining paranasal sinuses are clear. Other: None. IMPRESSION: 1. No acute  intracranial process. Electronically Signed   By: Bobbye Burrow M.D.   On: 10/02/2023 16:33    Procedures Procedures    Medications Ordered in ED  Medications  oxyCODONE -acetaminophen  (PERCOCET/ROXICET) 5-325 MG per tablet 1 tablet (1 tablet Oral Given 10/02/23 1811)  sodium chloride  0.9 % bolus 500 mL (500 mLs Intravenous New Bag/Given 10/02/23 1849)    ED Course/ Medical Decision Making/ A&P                                 Medical Decision Making Amount and/or Complexity of Data Reviewed Labs: ordered. Radiology: ordered.  Risk Prescription drug management. Decision regarding hospitalization.   Patient is a 63 year old who presents after a syncopal episode.  He has been having some episodes of an accelerated idioventricular rhythm here in the ED.  He does not appear to be super symptomatic with this.  He also has some concerns for possible seizure activity.  His labs are nonconcerning.  Head CT does not show any acute abnormality.  X-rays of his chest as well as left ankle and left foot do not show any acute abnormality.  These were interpreted by me and confirmed by the radiologist.  No focal neurologic deficits.  Discussed with cardiology who is seen the patient and recommends a cardiac CT to be done tomorrow.  Discussed with the family medicine resident he will admit the patient for further treatment.  Final Clinical Impression(s) / ED Diagnoses Final diagnoses:  Syncope, unspecified syncope type  Accelerated idioventricular rhythm [facility])    Rx / DC Orders ED Discharge Orders     None         Hershel Los, MD 10/02/23 2137

## 2023-10-02 NOTE — ED Triage Notes (Signed)
 PT BIB [facility] from home for altered mental status. Pt has burning pain in back. Family reports stroke like symptoms that have been occurring for last two days. Family reports that patient had a syncopal episode approximately 2 hours ago.   *Pt has been on liquid diet for approximately 2 weeks ago.   Pt has no cardiac hx per ems.  Hx stroke, with right side deficits.  Pt started eliquis 2 weeks ago  132/82  Cbg 81 100 % RA  18 G LAC

## 2023-10-02 NOTE — ED Notes (Signed)
 Daughter Gabriel Gomez [phone] would like an update asap and to inform the nurse about his current medications, which she has

## 2023-10-02 NOTE — Assessment & Plan Note (Signed)
 Potassium 3.2. - Replete as indicated

## 2023-10-03 ENCOUNTER — Observation Stay (HOSPITAL_COMMUNITY)

## 2023-10-03 ENCOUNTER — Encounter (HOSPITAL_COMMUNITY): Payer: Self-pay | Admitting: Family Medicine

## 2023-10-03 ENCOUNTER — Other Ambulatory Visit: Payer: Self-pay

## 2023-10-03 DIAGNOSIS — E876 Hypokalemia: Secondary | ICD-10-CM | POA: Diagnosis not present

## 2023-10-03 DIAGNOSIS — R569 Unspecified convulsions: Secondary | ICD-10-CM

## 2023-10-03 DIAGNOSIS — Z5321 Procedure and treatment not carried out due to patient leaving prior to being seen by health care provider: Secondary | ICD-10-CM | POA: Diagnosis not present

## 2023-10-03 DIAGNOSIS — R55 Syncope and collapse: Secondary | ICD-10-CM | POA: Diagnosis not present

## 2023-10-03 DIAGNOSIS — S93402A Sprain of unspecified ligament of left ankle, initial encounter: Secondary | ICD-10-CM | POA: Diagnosis not present

## 2023-10-03 LAB — BASIC METABOLIC PANEL WITH GFR
Anion gap: 8 (ref 5–15)
BUN: 10 mg/dL (ref 8–23)
CO2: 24 mmol/L (ref 22–32)
Calcium: 8.4 mg/dL — ABNORMAL LOW (ref 8.9–10.3)
Chloride: 102 mmol/L (ref 98–111)
Creatinine, Ser: 1.05 mg/dL (ref 0.61–1.24)
GFR, Estimated: >60 mL/min (ref >60)
Glucose, Bld: 218 mg/dL — ABNORMAL HIGH (ref 70–99)
Potassium: 4 mmol/L (ref 3.5–5.1)
Sodium: 134 mmol/L — ABNORMAL LOW (ref 135–145)

## 2023-10-03 LAB — CBC
HCT: 33.4 % — ABNORMAL LOW (ref 39.0–52.0)
Hemoglobin: 11.8 g/dL — ABNORMAL LOW (ref 13.0–17.0)
MCH: 33 pg (ref 26.0–34.0)
MCHC: 35.3 g/dL (ref 30.0–36.0)
MCV: 93.3 fL (ref 80.0–100.0)
Platelets: 310 10*3/uL (ref 150–400)
RBC: 3.58 MIL/uL — ABNORMAL LOW (ref 4.22–5.81)
RDW: 11.9 % (ref 11.5–15.5)
WBC: 8.4 10*3/uL (ref 4.0–10.5)
nRBC: 0 % (ref 0.0–0.2)

## 2023-10-03 LAB — TSH: TSH: 2.189 u[IU]/mL (ref 0.350–4.500)

## 2023-10-03 LAB — MAGNESIUM: Magnesium: 1.4 mg/dL — ABNORMAL LOW (ref 1.7–2.4)

## 2023-10-03 LAB — GLUCOSE, CAPILLARY
Glucose-Capillary: 227 mg/dL — ABNORMAL HIGH (ref 70–99)
Glucose-Capillary: 228 mg/dL — ABNORMAL HIGH (ref 70–99)
Glucose-Capillary: 307 mg/dL — ABNORMAL HIGH (ref 70–99)

## 2023-10-03 LAB — HEMOGLOBIN A1C
Hgb A1c MFr Bld: 8.4 % — ABNORMAL HIGH (ref 4.8–5.6)
Mean Plasma Glucose: 194.38 mg/dL

## 2023-10-03 LAB — HIV ANTIBODY (ROUTINE TESTING W REFLEX): HIV Screen 4th Generation wRfx: NONREACTIVE

## 2023-10-03 MED ORDER — MAGNESIUM SULFATE 2 GM/50ML IV SOLN
2.0000 g | Freq: Once | INTRAVENOUS | Status: AC
  Administered 2023-10-03: 2 g via INTRAVENOUS
  Filled 2023-10-03: qty 50

## 2023-10-03 MED ORDER — POTASSIUM CHLORIDE CRYS ER 20 MEQ PO TBCR
40.0000 meq | EXTENDED_RELEASE_TABLET | Freq: Once | ORAL | Status: AC
  Administered 2023-10-03: 40 meq via ORAL
  Filled 2023-10-03: qty 2

## 2023-10-03 MED ORDER — ENSURE ENLIVE PO LIQD
237.0000 mL | Freq: Two times a day (BID) | ORAL | Status: DC
  Administered 2023-10-03: 237 mL via ORAL

## 2023-10-03 MED ORDER — ORAL CARE MOUTH RINSE: 15.0000 mL | OROMUCOSAL | Status: DC | PRN

## 2023-10-03 MED ORDER — TAMSULOSIN HCL 0.4 MG PO CAPS: 0.4000 mg | ORAL_CAPSULE | Freq: Every day | ORAL | 0 refills | Status: DC

## 2023-10-03 MED ORDER — PREGABALIN 100 MG PO CAPS: 100.0000 mg | ORAL_CAPSULE | Freq: Every day | ORAL | Status: DC

## 2023-10-03 MED ORDER — TRAMADOL HCL 50 MG PO TABS
25.0000 mg | ORAL_TABLET | Freq: Four times a day (QID) | ORAL | Status: DC | PRN
  Administered 2023-10-03 (×2): 25 mg via ORAL
  Filled 2023-10-03 (×2): qty 1

## 2023-10-03 MED ORDER — ACETAMINOPHEN 500 MG PO TABS
1000.0000 mg | ORAL_TABLET | Freq: Four times a day (QID) | ORAL | Status: DC
  Administered 2023-10-03: 1000 mg via ORAL
  Filled 2023-10-03 (×2): qty 2

## 2023-10-03 MED ORDER — METOPROLOL TARTRATE 25 MG PO TABS: 12.5000 mg | ORAL_TABLET | Freq: Two times a day (BID) | ORAL | 0 refills | Status: DC

## 2023-10-03 MED ORDER — METOPROLOL TARTRATE 25 MG PO TABS
25.0000 mg | ORAL_TABLET | Freq: Two times a day (BID) | ORAL | Status: DC
  Administered 2023-10-03: 25 mg via ORAL
  Filled 2023-10-03: qty 1

## 2023-10-03 MED ORDER — PREGABALIN 100 MG PO CAPS
100.0000 mg | ORAL_CAPSULE | Freq: Every day | ORAL | Status: DC
  Administered 2023-10-03: 100 mg via ORAL
  Filled 2023-10-03 (×2): qty 1

## 2023-10-03 NOTE — Progress Notes (Signed)
 Orthopedic Tech Progress Note Patient Details:  Gabriel Gomez 05-18-1960 [id number]  Ortho Devices Type of Ortho Device: CAM walker Ortho Device/Splint Location: LLE Ortho Device/Splint Interventions: Ordered, Application, Adjustment   Post Interventions Patient Tolerated: Well, Ambulated well Instructions Provided: Care of device, Adjustment of device  [name] 10/03/2023, 6:53 PM

## 2023-10-03 NOTE — Assessment & Plan Note (Addendum)
 Stable. 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 Possibly related to seizure-like activity? 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  - ECHO - Orthostatics - Consider spot EEG - Hold antihypertensives

## 2023-10-03 NOTE — Evaluation (Signed)
 Physical Therapy Evaluation Patient Details Name: Gabriel Gomez MRN: [id number] DOB: June 01, 1960 Today's Date: 10/03/2023  History of Present Illness  63 yo presenting 5/24 with AMS, reports of with multiple syncopal episodes; fall with L ankle injury. Admitted for management of syncope, found to have accelerated idioventricular rhythm, cardiology planning for outpatient 2D echo. PMH/PSH includes: CVA, HTN, PAD, BPH, PAF, esophagitis, HLD.   Clinical Impression  Pt in bed upon arrival of PT, agreeable to evaluation at this time. Prior to admission the pt was ambulating with use of SPC, reports 3-4 recent falls but typically active and enjoys gardening outside. The pt presents with significant pain in L ankle, specifically L heel to the touch, and posterior to lateral malleolus. He presents with significant limitations in movement of L toes, limited DF/PF, and eversion more limited than inversion. Pt reports numbness to calf which is his baseline after stroke. Pt needing min-modA with mobility initially, but improved with cues for hand placement and additional reps. He was able to complete ~20 ft ambulation in the room with min-modA (x2 L knee buckling needing increased assist) and use of RW to minimize wt through LLE. The pt did have significant drops in BP with position changes and exertion (see below). Recommend continued skilled PT to address activity tolerance, L ankle strength and mobility, and frequent falls. Recommend more supportive boot for L foot as well as ted hose with mobility to help improve BP control.   VITALS:  - supine in bed - BP: 144/69 (92); HR: 54bpm - sitting EOB - BP: 100/65 (76); HR: 57bpm - standing - BP: 83/50 (60); HR: 60bpm - standing after 3 min - BP: 100/51 (66); HR: 62bpm - sitting EOB - BP: 102/51 (66); HR: 56bpm - standing after ambulation - BP: 87/51 (62); HR: bpm - sitting in recliner - BP: 128/66 (83); HR: 54bpm      If plan is discharge home, recommend the  following: A lot of help with walking and/or transfers;A lot of help with bathing/dressing/bathroom;Assistance with cooking/housework;Assist for transportation;Help with stairs or ramp for entrance   Can travel by private vehicle        Equipment Recommendations Rolling walker (2 wheels);Other (comment) (tub bench, ted hose)  Recommendations for Other Services       Functional Status Assessment Patient has had a recent decline in their functional status and demonstrates the ability to make significant improvements in function in a reasonable and predictable amount of time.     Precautions / Restrictions Precautions Precautions: Fall Precaution/Restrictions Comments: frequent falls, orthostatic Required Braces or Orthoses: Other Brace Other Brace: ankle brace L ankle Restrictions Weight Bearing Restrictions Per Provider Order: No      Mobility  Bed Mobility Overal bed mobility: Needs Assistance Bed Mobility: Supine to Sit     Supine to sit: Min assist     General bed mobility comments: minA to complete, pt able to manage LE without assistance. mild LOB to R    Transfers Overall transfer level: Needs assistance Equipment used: Rolling walker (2 wheels) Transfers: Sit to/from Stand, Bed to chair/wheelchair/BSC Sit to Stand: Mod assist, Min assist   Step pivot transfers: Min assist       General transfer comment: initially modA to stand, cues to push from bed, then minA to steady with UE support on RW    Ambulation/Gait Ambulation/Gait assistance: Min assist, Mod assist Gait Distance (Feet): 20 Feet Assistive device: Rolling walker (2 wheels) Gait Pattern/deviations: Step-through pattern, Decreased step length -  right, Decreased stance time - left, Decreased weight shift to left, Knees buckling Gait velocity: decreased Gait velocity interpretation: <1.31 ft/sec, indicative of household ambulator   General Gait Details: pt with x2 buckling in L knee due to pain,  limited wt acceptance on LLE but able to manage with UE support.  Stairs Stairs:  (given handout for WC on stairs)            Balance Overall balance assessment: Needs assistance, History of Falls Sitting-balance support: No upper extremity supported, Feet supported Sitting balance-Leahy Scale: Good     Standing balance support: During functional activity, Bilateral upper extremity supported Standing balance-Leahy Scale: Fair Standing balance comment: can static stand without UE support, BUE support for gait and pain management                             Pertinent Vitals/Pain Pain Assessment Pain Assessment: 0-10 Pain Score: 7  Pain Location: L ankle, toes, heel Pain Descriptors / Indicators: Discomfort, Sharp Pain Intervention(s): Limited activity within patient's tolerance, Monitored during session, Repositioned    Home Living Family/patient expects to be discharged to:: Private residence Living Arrangements: Spouse/significant other Available Help at Discharge: Family;Available 24 hours/day Type of Home: House Home Access: Stairs to enter Entrance Stairs-Rails: Right;Left;Can reach both Entrance Stairs-Number of Steps: 4-5   Home Layout: One level Home Equipment: Cane - single point;Shower seat;Hand held shower head      Prior Function Prior Level of Function : Independent/Modified Independent;History of Falls (last six months);Driving             Mobility Comments: use of SPC, 3-4 falls needing assist from family to get up. likes to be active, out in yard ADLs Comments: pt reports independence, copoking simple meals, working in yard     Extremity/Trunk Assessment   Upper Extremity Assessment Upper Extremity Assessment: Defer to OT evaluation    Lower Extremity Assessment Lower Extremity Assessment: LLE deficits/detail LLE Deficits / Details: hx of stroke with L sided deficits. pt reports numbness and tingling to L shin that is not new from  fall. pain to ankle with limited ability to point/flex toes and significant weakness with DF/PF and ankle eversion compared to R ankle. pain to touch around lateral malleolus and heel LLE: Unable to fully assess due to pain LLE Sensation: decreased light touch LLE Coordination: WNL    Cervical / Trunk Assessment Cervical / Trunk Assessment: Normal  Communication   Communication Communication: Impaired Factors Affecting Communication: Hearing impaired    Cognition Arousal: Alert Behavior During Therapy: Flat affect                           PT - Cognition Comments: pt able to answer questions, family reports at baseline, pt flat ad reports he doesn't feel like himself. following simple commands Following commands: Impaired Following commands impaired: Follows one step commands with increased time     Cueing Cueing Techniques: Verbal cues     General Comments General comments (skin integrity, edema, etc.): orthostatic, from 144/69 (92) to 83/50 (60) after standing    Exercises     Assessment/Plan    PT Assessment Patient needs continued PT services  PT Problem List Decreased strength;Decreased activity tolerance;Decreased balance;Pain       PT Treatment Interventions DME instruction;Gait training;Stair training;Functional mobility training;Therapeutic activities;Therapeutic exercise;Balance training;Patient/family education    PT Goals (Current goals can be found in  the Care Plan section)  Acute Rehab PT Goals Patient Stated Goal: attend sister's funeral tomorrow, then return to gardening PT Goal Formulation: With patient Time For Goal Achievement: 10/17/23 Potential to Achieve Goals: Good    Frequency Min 2X/week        AM-PAC PT "6 Clicks" Mobility  Outcome Measure Help needed turning from your back to your side while in a flat bed without using bedrails?: A Little Help needed moving from lying on your back to sitting on the side of a flat bed without  using bedrails?: A Little Help needed moving to and from a bed to a chair (including a wheelchair)?: A Little Help needed standing up from a chair using your arms (e.g., wheelchair or bedside chair)?: A Little Help needed to walk in hospital room?: A Lot Help needed climbing 3-5 steps with a railing? : Total 6 Click Score: 15    End of Session Equipment Utilized During Treatment: Gait belt Activity Tolerance: Patient tolerated treatment well;Patient limited by pain Patient left: in chair;with call bell/phone within reach;with family/visitor present Nurse Communication: Mobility status PT Visit Diagnosis: Unsteadiness on feet (R26.81);Repeated falls (R29.6);Pain Pain - Right/Left: Left Pain - part of body: Ankle and joints of foot    Time: 1452-1556 PT Time Calculation (min) (ACUTE ONLY): 64 min   Charges:   PT Evaluation $PT Eval Moderate Complexity: 1 Mod PT Treatments $Therapeutic Exercise: 8-22 mins PT General Charges $$ ACUTE PT VISIT: 1 Visit         Barnabas Booth, PT, DPT   Acute Rehabilitation Department Office [phone] Secure Chat Communication Preferred  Lona Rist 10/03/2023, 4:21 PM

## 2023-10-03 NOTE — ED Notes (Signed)
 ED TO INPATIENT HANDOFF REPORT  ED Nurse Name and Phone #: D Daxter Paule RN   S Name/Age/Gender Gabriel Gomez 63 y.o. male Room/Bed: 012C/012C  Code Status   Code Status: Full Code  Home/SNF/Other Home Patient oriented to: self Is this baseline? Yes   Triage Complete: Triage complete  Chief Complaint Syncope and collapse [R55]  Triage Note PT BIB [facility] from home for altered mental status. Pt has burning pain in back. Family reports stroke like symptoms that have been occurring for last two days. Family reports that patient had a syncopal episode approximately 2 hours ago.   *Pt has been on liquid diet for approximately 2 weeks ago.   Pt has no cardiac hx per ems.  Hx stroke, with right side deficits.  Pt started eliquis 2 weeks ago  132/82  Cbg 81 100 % RA  18 G LAC    Allergies Allergies  Allergen Reactions   Meloxicam  Swelling    Both legs   Morphine  Other (See Comments)    Causes severe confusion and aggression   Tape Other (See Comments)    Not an allergy, daughter wants it added because it tears his skin    Level of Care/Admitting Diagnosis ED Disposition     ED Disposition  Admit   Condition  --   Comment  Hospital Area: [facility] [[id number]]  Level of Care: Telemetry Medical [104]  May place patient in observation at [facility] or [facility] if equivalent level of care is available:: No  Covid Evaluation: Asymptomatic - no recent exposure (last 10 days) testing not required  Diagnosis: Syncope and collapse [780.2.ICD-9-CM]  Admitting Physician: Ivin Marrow [[id number]]  Attending Physician: Kandis Ormond [[id number]]          B Medical/Surgery History Past Medical History:  Diagnosis Date   Arthritis    COPD (chronic obstructive pulmonary disease) (HCC)    Coronary artery disease    Diabetes mellitus without complication (HCC)    GERD (gastroesophageal reflux disease)    Past Surgical History:  Procedure  Laterality Date   CHONDROPLASTY Right 12/15/2021   Procedure: MEDIAL COMPARTMENT CHONDROPLASTY;  Surgeon: Adah Acron, MD;  Location: [facility];  Service: Orthopedics;  Laterality: Right;   JOINT REPLACEMENT Left    KNEE ARTHROSCOPY Right 12/15/2021   Procedure: RIGHT KNEE ARTHROSCOPY;  Surgeon: Adah Acron, MD;  Location: [facility];  Service: Orthopedics;  Laterality: Right;   LEFT HEART CATH AND CORONARY ANGIOGRAPHY N/A 05/30/2018   Procedure: LEFT HEART CATH AND CORONARY ANGIOGRAPHY;  Surgeon: Arnoldo Lapping, MD;  Location: [facility] INVASIVE CV LAB;  Service: Cardiovascular;  Laterality: N/A;   liver laceration  1979     A IV Location/Drains/Wounds Patient Lines/Drains/Airways Status     Active Line/Drains/Airways     Name Placement date Placement time Site Days   Peripheral IV 10/02/23 18 G Left Antecubital 10/02/23  --  Antecubital  1   Incision (Closed) 12/15/21 Knee Right 12/15/21  0808  -- 657            Intake/Output Last 24 hours  Intake/Output Summary (Last 24 hours) at 10/03/2023 0043 Last data filed at 10/02/2023 2316 Gross per 24 hour  Intake 500 ml  Output --  Net 500 ml    Labs/Imaging Results for orders placed or performed during the hospital encounter of 10/02/23 (from the past 48 hours)  Comprehensive metabolic panel     Status: Abnormal   Collection Time:  10/02/23  3:48 PM  Result Value Ref Range   Sodium 137 135 - 145 mmol/L   Potassium 3.2 (L) 3.5 - 5.1 mmol/L   Chloride 103 98 - 111 mmol/L   CO2 24 22 - 32 mmol/L   Glucose, Bld 122 (H) 70 - 99 mg/dL    Comment: Glucose reference range applies only to samples taken after fasting for at least 8 hours.   BUN 11 8 - 23 mg/dL   Creatinine, Ser 1.61 0.61 - 1.24 mg/dL   Calcium  8.5 (L) 8.9 - 10.3 mg/dL   Total Protein 5.6 (L) 6.5 - 8.1 g/dL   Albumin 3.0 (L) 3.5 - 5.0 g/dL   AST 16 15 - 41 U/L   ALT 19 0 - 44 U/L   Alkaline Phosphatase 67 38 - 126 U/L   Total Bilirubin  0.6 0.0 - 1.2 mg/dL   GFR, Estimated >09 >60 mL/min    Comment: (NOTE) Calculated using the CKD-EPI Creatinine Equation (2021)    Anion gap 10 5 - 15    Comment: Performed at [facility], [address] [place], [place] [address]  CBC with Differential/Platelet     Status: Abnormal   Collection Time: 10/02/23  3:48 PM  Result Value Ref Range   WBC 9.6 4.0 - 10.5 K/uL   RBC 3.65 (L) 4.22 - 5.81 MIL/uL   Hemoglobin 11.9 (L) 13.0 - 17.0 g/dL   HCT 81.1 (L) 91.4 - 78.2 %   MCV 93.7 80.0 - 100.0 fL   MCH 32.6 26.0 - 34.0 pg   MCHC 34.8 30.0 - 36.0 g/dL   RDW 95.6 21.3 - 08.6 %   Platelets 321 150 - 400 K/uL   nRBC 0.0 0.0 - 0.2 %   Neutrophils Relative % 59 %   Neutro Abs 5.8 1.7 - 7.7 K/uL   Lymphocytes Relative 29 %   Lymphs Abs 2.8 0.7 - 4.0 K/uL   Monocytes Relative 8 %   Monocytes Absolute 0.7 0.1 - 1.0 K/uL   Eosinophils Relative 2 %   Eosinophils Absolute 0.2 0.0 - 0.5 K/uL   Basophils Relative 1 %   Basophils Absolute 0.1 0.0 - 0.1 K/uL   Immature Granulocytes 1 %   Abs Immature Granulocytes 0.05 0.00 - 0.07 K/uL    Comment: Performed at [facility], [address], [place], [place] [address]  Troponin I (High Sensitivity)     Status: None   Collection Time: 10/02/23  3:48 PM  Result Value Ref Range   Troponin I (High Sensitivity) 8 <18 ng/L    Comment: (NOTE) Elevated high sensitivity troponin I (hsTnI) values and significant  changes across serial measurements may suggest ACS but many other  chronic and acute conditions are known to elevate hsTnI results.  Refer to the "Links" section for chest pain algorithms and additional  guidance. Performed at [facility], [address], [place], [place] [address]   CBG monitoring, ED     Status: Abnormal   Collection Time: 10/02/23  4:19 PM  Result Value Ref Range   Glucose-Capillary 110 (H) 70 - 99 mg/dL    Comment: Glucose reference range applies only to samples taken after fasting for at least 8  hours.   Comment 1 Notify RN    Comment 2 Document in Chart   I-Stat Lactic Acid     Status: None   Collection Time: 10/02/23  4:58 PM  Result Value Ref Range  Lactic Acid, Venous 0.6 0.5 - 1.9 mmol/L  I-Stat Chem 8, ED     Status: Abnormal   Collection Time: 10/02/23  5:01 PM  Result Value Ref Range   Sodium 139 135 - 145 mmol/L   Potassium 3.3 (L) 3.5 - 5.1 mmol/L   Chloride 101 98 - 111 mmol/L   BUN 12 8 - 23 mg/dL   Creatinine, Ser 4.09 (H) 0.61 - 1.24 mg/dL   Glucose, Bld 811 (H) 70 - 99 mg/dL    Comment: Glucose reference range applies only to samples taken after fasting for at least 8 hours.   Calcium , Ion 1.18 1.15 - 1.40 mmol/L   TCO2 25 22 - 32 mmol/L   Hemoglobin 12.2 (L) 13.0 - 17.0 g/dL   HCT 91.4 (L) 78.2 - 95.6 %  Urinalysis, Routine w reflex microscopic -Urine, Unspecified Source     Status: Abnormal   Collection Time: 10/02/23  6:38 PM  Result Value Ref Range   Color, Urine STRAW (A) YELLOW   APPearance CLEAR CLEAR   Specific Gravity, Urine 1.005 1.005 - 1.030   pH 7.0 5.0 - 8.0   Glucose, UA 50 (A) NEGATIVE mg/dL   Hgb urine dipstick NEGATIVE NEGATIVE   Bilirubin Urine NEGATIVE NEGATIVE   Ketones, ur NEGATIVE NEGATIVE mg/dL   Protein, ur 213 (A) NEGATIVE mg/dL   Nitrite NEGATIVE NEGATIVE   Leukocytes,Ua NEGATIVE NEGATIVE   RBC / HPF 0-5 0 - 5 RBC/hpf   WBC, UA 0-5 0 - 5 WBC/hpf   Bacteria, UA NONE SEEN NONE SEEN   Squamous Epithelial / HPF 0-5 0 - 5 /HPF    Comment: Performed at [facility], [address] [place], [place] [address]  Troponin I (High Sensitivity)     Status: None   Collection Time: 10/02/23  6:38 PM  Result Value Ref Range   Troponin I (High Sensitivity) 9 <18 ng/L    Comment: (NOTE) Elevated high sensitivity troponin I (hsTnI) values and significant  changes across serial measurements may suggest ACS but many other  chronic and acute conditions are known to elevate hsTnI results.  Refer to the "Links" section for chest pain  algorithms and additional  guidance. Performed at [facility], [address], [place], [place] [address]    DG Ankle Complete Left Result Date: 10/02/2023 CLINICAL DATA:  Marvell Slider, pain, swelling EXAM: LEFT ANKLE COMPLETE - 3+ VIEW; LEFT FOOT - COMPLETE 3+ VIEW COMPARISON:  None Available. FINDINGS: Left ankle: Frontal, oblique, and lateral views of the left ankle are obtained. Prior proximal fibular resection. No acute displaced fracture, subluxation, or dislocation. Joint spaces are well preserved. Soft tissues are unremarkable. Left foot: Frontal, oblique, and lateral views are obtained. No acute displaced fracture, subluxation, or dislocation. Moderate osteoarthritis of the first metatarsophalangeal joint. Mild osteoarthritis throughout the interphalangeal joints. Soft tissues are unremarkable. IMPRESSION: 1. 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 2. No acute displaced fractures. Electronically Signed   By: Bobbye Burrow M.D.   On: 10/02/2023 17:09   DG Foot Complete Left Result Date: 10/02/2023 CLINICAL DATA:  Marvell Slider, pain, swelling EXAM: LEFT ANKLE COMPLETE - 3+ VIEW; LEFT FOOT - COMPLETE 3+ VIEW COMPARISON:  None Available. FINDINGS: Left ankle: Frontal, oblique, and lateral views of the left ankle are obtained. Prior proximal fibular resection. No acute displaced fracture, subluxation, or dislocation. Joint spaces are well preserved. Soft tissues are unremarkable. Left foot: Frontal, oblique, and lateral views are obtained. No acute  displaced fracture, subluxation, or dislocation. Moderate osteoarthritis of the first metatarsophalangeal joint. Mild osteoarthritis throughout the interphalangeal joints. Soft tissues are unremarkable. IMPRESSION: 1. 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 2. No acute displaced fractures. Electronically Signed   By: Bobbye Burrow M.D.   On: 10/02/2023 17:09   CT Thoracic Spine Wo  Contrast Result Date: 10/02/2023 CLINICAL DATA:  Marvell Slider, back trauma, near syncope EXAM: CT THORACIC SPINE WITHOUT CONTRAST TECHNIQUE: Multidetector CT images of the thoracic were obtained using the standard protocol without intravenous contrast. RADIATION DOSE REDUCTION: This exam was performed according to the departmental dose-optimization program which includes automated exposure control, adjustment of the mA and/or kV according to patient size and/or use of iterative reconstruction technique. COMPARISON:  11/30/2022 FINDINGS: Alignment: Alignment is anatomic. Vertebrae: No acute fracture or focal pathologic process. Paraspinal and other soft tissues: Paraspinal soft tissues are unremarkable. Dependent hypoventilatory changes are seen within the lung bases. Disc levels: No significant spondylosis or facet hypertrophy. No bony encroachment upon the central canal or neural foramina. Reconstructed images demonstrate no additional findings. IMPRESSION: 1. No acute thoracic spine fracture. Electronically Signed   By: Bobbye Burrow M.D.   On: 10/02/2023 16:49   CT CERVICAL SPINE WO CONTRAST Result Date: 10/02/2023 CLINICAL DATA:  Marvell Slider, neck trauma EXAM: CT CERVICAL SPINE WITHOUT CONTRAST TECHNIQUE: Multidetector CT imaging of the cervical spine was performed without intravenous contrast. Multiplanar CT image reconstructions were also generated. RADIATION DOSE REDUCTION: This exam was performed according to the departmental dose-optimization program which includes automated exposure control, adjustment of the mA and/or kV according to patient size and/or use of iterative reconstruction technique. COMPARISON:  None Available. FINDINGS: Alignment: Alignment is anatomic. Skull base and vertebrae: No acute fracture. No primary bone lesion or focal pathologic process. Soft tissues and spinal canal: No prevertebral fluid or swelling. No visible canal hematoma. Disc levels: Mild multilevel spondylosis most pronounced at  C5-6 and C6-7. Upper chest: Airway is patent.  Lung apices are clear. Other: Reconstructed images demonstrate no additional findings. IMPRESSION: 1. No acute cervical spine fracture. Electronically Signed   By: Bobbye Burrow M.D.   On: 10/02/2023 16:35   CT HEAD WO CONTRAST Result Date: 10/02/2023 CLINICAL DATA:  Marvell Slider, near syncope, head trauma EXAM: CT HEAD WITHOUT CONTRAST TECHNIQUE: Contiguous axial images were obtained from the base of the skull through the vertex without intravenous contrast. RADIATION DOSE REDUCTION: This exam was performed according to the departmental dose-optimization program which includes automated exposure control, adjustment of the mA and/or kV according to patient size and/or use of iterative reconstruction technique. COMPARISON:  05/28/2018 FINDINGS: Brain: No acute infarct or hemorrhage. Lateral ventricles and midline structures are unremarkable. No acute extra-axial fluid collections. No mass effect. Vascular: No hyperdense vessel or unexpected calcification. Skull: Normal. Negative for fracture or focal lesion. Sinuses/Orbits: Polypoid mucosal thickening versus mucous retention cyst left maxillary sinus. Remaining paranasal sinuses are clear. Other: None. IMPRESSION: 1. No acute intracranial process. Electronically Signed   By: Bobbye Burrow M.D.   On: 10/02/2023 16:33    Pending Labs Unresulted Labs (From admission, onward)     Start     Ordered   10/03/23 0600  HIV Antibody (routine testing w rflx)  (HIV Antibody (Routine testing w reflex) panel)  Once,   R        10/02/23 2203   10/03/23 0500  TSH  Tomorrow morning,   R        10/02/23 2203   10/03/23  0500  Basic metabolic panel  Tomorrow morning,   R        10/02/23 2203   10/03/23 0500  CBC  Tomorrow morning,   R        10/02/23 2203   10/03/23 0500  Hemoglobin A1c  Tomorrow morning,   R       Comments: To assess prior glycemic control    10/02/23 2208            Vitals/Pain Today's Vitals    10/02/23 2150 10/02/23 2218 10/02/23 2219 10/02/23 2318  BP:   (!) 178/78   Pulse:   67   Resp:      Temp: 98.1 F (36.7 C)     TempSrc: Oral     SpO2:      Weight:      Height:      PainSc:  7   5     Isolation Precautions No active isolations  Medications Medications  atorvastatin  (LIPITOR ) tablet 80 mg (has no administration in time range)  DULoxetine (CYMBALTA) DR capsule 60 mg (has no administration in time range)  insulin  glargine-yfgn (SEMGLEE ) injection 25 Units (has no administration in time range)  pantoprazole  (PROTONIX ) EC tablet 40 mg (has no administration in time range)  albuterol  (PROVENTIL ) (2.5 MG/3ML) 0.083% nebulizer solution 3 mL (has no administration in time range)  apixaban (ELIQUIS) tablet 5 mg (5 mg Oral Given 10/02/23 2225)  acetaminophen  (TYLENOL ) tablet 650 mg (650 mg Oral Given 10/02/23 2218)  insulin  aspart (novoLOG ) injection 0-9 Units (has no administration in time range)  metoprolol  tartrate (LOPRESSOR ) tablet 25 mg (has no administration in time range)  pregabalin  (LYRICA ) capsule 100 mg (100 mg Oral Given 10/03/23 0019)  oxyCODONE -acetaminophen  (PERCOCET/ROXICET) 5-325 MG per tablet 1 tablet (1 tablet Oral Given 10/02/23 1811)  sodium chloride  0.9 % bolus 500 mL (0 mLs Intravenous Stopped 10/02/23 2316)    Mobility walks with person assist     Focused Assessments Neuro Assessment Handoff:  Swallow screen pass? Yes  Cardiac Rhythm: Idioventricular       Neuro Assessment:   Neuro Checks:      Has TPA been given? No If patient is a Neuro Trauma and patient is going to OR before floor call report to 4N Charge nurse: [phone] or [phone]   R Recommendations: See Admitting Provider Note  Report given to:   Additional Notes: PT was confused per EMS but is A&O now. PT states some left foot pain and ankle brace applied per MD. No acute fractures noted and PT states some low back pain.Aaron Aas VSS

## 2023-10-03 NOTE — Discharge Summary (Signed)
 Family Medicine Teaching [facility] AMA Summary  Patient name: Gabriel Gomez Medical record number: [id number] Date of birth: 01-18-1961 Age: 63 y.o. Gender: male Date of Admission: 10/02/2023  Date of AMA: 10/03/23  Admitting Physician: Ivin Marrow, MD  Primary Care Provider: Patient, No Pcp Per Consultants: Neurology   Indication for Hospitalization: Syncope   Discharge Diagnoses/Problem List:  Principal Problem for Admission:  Other Problems addressed during stay:  Principal Problem:   Syncope and collapse Active Problems:   Hypokalemia   Type 2 diabetes mellitus with neurological complications (HCC)   Chronic health problem   Hypertension associated with diabetes (HCC)   Accelerated idioventricular rhythm (HCC)   Left ankle sprain    Brief Hospital Course:  Gabriel Gomez is a 63 y.o. male presenting with multiple syncopal episodes most likely secondary to orthostatic hypotension v. CAD. Pertinent PMH/PSH includes BPH, PAF, esophagitis, HLD. His hospital course is outlined below:   Syncope 2/2 to Orthostatic Hypotension:  Found down by daughter. This one of multiple episodes of falling or staring off into space. In the ED, patient was hypertensive with otherwise stable vitals, lab results showed normal troponins, mild hypokalemia, and were otherwise unremarkable.  CT head neck thoracic spine showed no evidence of fracture.  EKG showed idioventricular rhythm, cardiology was consulted and recommended cardiac CT. Orthostatic vital signs were positive x2, his home blood pressure medications were held on admission along with Flomax. On exam he had notable weakness on the LLE which prompted and stroke workup. MRI brain wo showed stable lacunar infarcts and new lacunar infarct in left corona radiata since prior study. EEG showed no signs of seizure. Neurology was consulted and thought syncope most likely 2/2 to orthostatic hypotension.  Due to his sister's funeral patient  wanted to leave AMA prior to his MRI with contrast being completed to complete seizure workup. OT saw prior to [facility] and referral to outpatient OT was placed. He received compression stockings and an abdominal binder was ordered.  He left with the following medication adjustments:  DECREASE Metoprolol  to 12.5 mg BID  HOLD Lisinopril 40 mg  DECREASE Flomax to 0.4 mg daily  HOLD Bethanechol 50 mg   Accelerated Idioventricular Rhythm:  Cardiology consulted and thought rhythm could be from CAD. They recommended CTA coronary to assess blockage. Unfortunately result was still pending at time of AMA.   Left Foot Pain:  DG negative for fractures. Given a CAM boot.    PCP Follow Up:  MRI brain with contrast to evaluate for seizure activity  New evidence of lacunar infarct on MRI. Continue with statin and diabetic control. Positive orthostatic hypotension without tachycardia most likely secondary to BB.  Titrate blood pressure to standing Follow up foot pain    Disposition: home   Discharge Condition: AMA, stable  Discharge Exam:  Vitals:   10/03/23 1229 10/03/23 1455  BP: (!) 157/66 (!) 144/69  Pulse: (!) 58 (!) 54  Resp: 18   Temp: 100.3 F (37.9 C)   SpO2: 99%    Chronically ill-appearing, no acute distress Cardio: Regular rate, regular rhythm, no murmurs on exam. Pulm: Clear, no wheezing, no crackles. No increased work of breathing Abdominal: bowel sounds present, soft, non-tender, non-distended Extremities: no peripheral edema  Neuro: alert and oriented x3, speech normal in content, no facial asymmetry, strength intact and equal bilaterally in UE and LE, pupils equal and reactive to light.   Significant Procedures: EEG: negative for seizure   Significant Labs and Imaging:  Recent Labs  Lab 10/02/23 1548 10/02/23 1701 10/03/23 0717  WBC 9.6  --  8.4  HGB 11.9* 12.2* 11.8*  HCT 34.2* 36.0* 33.4*  PLT 321  --  310   Recent Labs  Lab 10/02/23 1548 10/02/23 1701  10/03/23 0717  NA 137 139 134*  K 3.2* 3.3* 4.0  CL 103 101 102  CO2 24  --  24  GLUCOSE 122* 120* 218*  BUN 11 12 10   CREATININE 1.21 1.30* 1.05  CALCIUM  8.5*  --  8.4*  MG  --   --  1.4*  ALKPHOS 67  --   --   AST 16  --   --   ALT 19  --   --   ALBUMIN 3.0*  --   --       CT head WO Contrast: no acute intracranial process  CT C-spine WO Contrast: no acute cervical spine fracture Mild multilevel spondylosis most pronounced at C5-6 and C6-7.  CT T-spine WO Contrast: no acute thoracic spine fracture   DC Ankle Left:  1. 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 2. No acute displaced fractures.  DG Foot Left:  1. 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 2. No acute displaced fractures.  MRI Brain WO Contrast:  1. No acute intracranial abnormality. 2. Stable lacunar infarcts in the left lateral pons and right thalamus. New lacunar infarct in the left corona radiata since the prior study, not acute. 3. Mildly advanced periventricular and scattered subcortical T2 hyperintensities for age, similar to the prior exam.  Results/Tests Pending at Time of Discharge:  MRI head w contrast  CT cardiac scoring   Discharge Medications:  Allergies as of 10/03/2023       Reactions   Meloxicam  Swelling   Both legs   Morphine  Other (See Comments)   Causes severe confusion and aggression   Tape Other (See Comments)   Not an allergy, daughter wants it added because it tears his skin        Medication List     PAUSE taking these medications    bethanechol 50 MG tablet Wait to take this until your doctor or other care provider tells you to start again. Commonly known as: URECHOLINE Take 50 mg by mouth 2 (two) times daily.       STOP taking these medications    lisinopril 40 MG tablet Commonly known as: ZESTRIL   pantoprazole  40 MG tablet Commonly known as: PROTONIX        TAKE  these medications    Accu-Chek Guide test strip Generic drug: glucose blood USE ONE strip FOUR TIMES DAILY   Accu-Chek Guide w/Device Kit To use to check glucose qd   Accu-Chek Softclix Lancets lancets Use as instructed   apixaban 5 MG Tabs tablet Commonly known as: ELIQUIS Take 5 mg by mouth 2 (two) times daily.   atorvastatin  80 MG tablet Commonly known as: LIPITOR  TAKE ONE TABLET BY MOUTH ONCE DAILY AT 6pm What changed: See the new instructions.   DULoxetine 60 MG capsule Commonly known as: CYMBALTA Take 60 mg by mouth daily.   Lantus  SoloStar 100 UNIT/ML Solostar Pen Generic drug: insulin  glargine Inject 30 Units into the skin at bedtime.   metoprolol  tartrate 25 MG tablet Commonly known as: LOPRESSOR  Take 0.5 tablets (12.5 mg total) by mouth 2 (two) times daily. What changed:  how much to take when to take this   nitroGLYCERIN  0.4 MG SL tablet Commonly known as: NITROSTAT   Place 1 tablet (0.4 mg total) under the tongue every 5 (five) minutes x 3 doses as needed for chest pain.   NovoLOG  FlexPen 100 UNIT/ML FlexPen Generic drug: insulin  aspart inject 15 units EVERY MORNING, 10 units AT LUNCH AND 10 units EVERY EVENING   omeprazole  20 MG capsule Commonly known as: PRILOSEC TAKE ONE CAPSULE BY MOUTH ONCE DAILY AS NEEDED FOR reflux   pregabalin  100 MG capsule Commonly known as: LYRICA  Take 100 mg by mouth 2 (two) times daily.   sucralfate 1 GM/10ML suspension Commonly known as: CARAFATE Take 1 g by mouth 4 (four) times daily -  with meals and at bedtime.   Sure Comfort Pen Needles 32G X 4 MM Misc Generic drug: Insulin  Pen Needle AS DIRECTED FOUR TIMES DAILY   tamsulosin 0.4 MG Caps capsule Commonly known as: FLOMAX Take 1 capsule (0.4 mg total) by mouth daily. What changed: when to take this   traMADol  50 MG tablet Commonly known as: ULTRAM  Take 50 mg by mouth every 8 (eight) hours as needed for moderate pain (pain score 4-6).   Ventolin  HFA 108  (90 Base) MCG/ACT inhaler Generic drug: albuterol  Inhale 2 puffs into the lungs every 6 (six) hours as needed for wheezing or shortness of breath.               Durable Medical Equipment  (From admission, onward)           Start     Ordered   10/03/23 1556  For home use only DME Tub bench  Once        10/03/23 1555   10/03/23 1556  For home use only DME Walker rolling  Once       Question Answer Comment  Walker: With 5 Inch Wheels   Patient needs a walker to treat with the following condition Orthostatic hypotension      10/03/23 1555            Discharge Instructions: Please refer to Patient Instructions section of EMR for full details.  Patient was counseled important signs and symptoms that should prompt return to medical care, changes in medications, dietary instructions, activity restrictions, and follow up appointments.   Follow-Up Appointments:   Clem Currier, DO 10/03/2023, 5:47 PM PGY-2, [facility]

## 2023-10-03 NOTE — Assessment & Plan Note (Signed)
 A1c 8.4  Previously controlled.  Home regimen includes Lantus  25 units, NovoLog  15 - 10 - 10 with each meal. - Continue Lantus  25 units - Hold home NovoLog  - Sensitive sliding scale with meals - Hold home pregabalin  for peripheral neuropathy given syncope and concern for sedation - Continue duloxetine 60 mg daily

## 2023-10-03 NOTE — Progress Notes (Signed)
 RNCM spoke to patient's daughter as patient was eating.  Patient's daughter stated he does not want HH and prefers outpatient therapy.  RNCM notified provider.

## 2023-10-03 NOTE — Progress Notes (Addendum)
 Pt wants to be DC, as he will attend his sister's funeral tomorrow 10/04/23. MD was informed and talked to family and patient, but pt still wanted to leave AMA. Instructions about medicine and follow-up was done by MD. Pt signed the LAMA form, kept in the chart. Cam boot for the LLE and Ted hose is ordered prior to DC.

## 2023-10-03 NOTE — Plan of Care (Signed)
 Discussed with primary team  Based on the history documented in the chart, most likely orthostatic hypotension.  At times with cerebral hypoperfusion can even have seizure-like activity that looks very convincing for generalized tonic-clonic seizure but the differences that patients come back to baseline pretty quickly once hypotension is resolved (laying flat with legs up, will continue to shake as long as they are being held up or if their pressure remains low for other reasons).    I do agree with screening with MRI brain (with and without contrast is ideal for seizure workup to make sure nothing is hiding in the leptomeninges, if renal function allows), and a routine EEG.  But if the studies are reassuring and the patient is not having frequent unexplained spells, outpatient follow-up is appropriate  If he is having frequent spells while in the hospital we can consider long-term EEG for spell characterization, in which case please do reach out to neurology for full consultation  Please additionally reach out if any acute abnormalities detected on MRI brain with/without contrast (routine EEG has resulted without evidence for epileptogenicity)  MRI brain resulted and reviewed: 1. No acute intracranial abnormality. 2. Stable lacunar infarcts in the left lateral pons and right thalamus. New lacunar infarct in the left corona radiata since the prior study, not acute. 3. Mildly advanced periventricular and scattered subcortical T2 hyperintensities for age, similar to the prior exam.  These are curbside recommendations based upon the information readily available in the chart on brief review as well as history and examination information provided to me by requesting provider and do not replace a full detailed consult  18  minutes spent in care of this patient, majority in discussion with Dr. Annabell Key via secure chat, who will notify patient of interprofessional consultation

## 2023-10-03 NOTE — Progress Notes (Signed)
 EEG complete - results pending

## 2023-10-03 NOTE — Care Management Obs Status (Signed)
 MEDICARE OBSERVATION STATUS NOTIFICATION   Patient Details  Name: Gabriel Gomez MRN: [id number] Date of Birth: 1960/10/14   Medicare Observation Status Notification Given:  Yes    Mykal Batiz G., RN 10/03/2023, 8:43 AM

## 2023-10-03 NOTE — Assessment & Plan Note (Signed)
 Exam consistent with left ankle sprain. - Lace up ankle brace - PT/OT evaluation  - Tylenol  as needed for pain - Hold home tramadol  due to sedation

## 2023-10-03 NOTE — Progress Notes (Signed)
 Patient BP elevated this morning at 178/73, MAP 104, HR 63.  Patient asymptomatic and sleeping.  Dr Ival Marines made aware, order received to monitor and morning metoprolol  will be given earlier this am.  Will continue to monitor.

## 2023-10-03 NOTE — Evaluation (Signed)
 Occupational Therapy Evaluation Patient Details Name: Gabriel Gomez MRN: [id number] DOB: [birth year] Today's Date: 10/03/2023   History of Present Illness   63 yo presenting 5/24 with AMS, reports of with multiple syncopal episodes; fall with L ankle injury. Admitted for management of syncope, found to have accelerated idioventricular rhythm, cardiology planning for outpatient 2D echo. PMH/PSH includes: CVA, HTN, PAD, BPH, PAF, esophagitis, HLD.     Clinical Impressions PTA pt lives independently with his wife and  and drives. Pt with complaints of L ankle and heel pain when weight bearing. Family ableto assist with ADL tasks after DC. Pt may benefit form use of TED hose and switching to a CAM boot to offload L heel to reduce pain during mobility. Discussed use of tub bench for transfers (pt has Medicaid). Would benefit from education on counter pressure exercises due to syncope. BP 144/69 supine and eventually after activity his BP dropped to 87/51 (standing) - MD made aware. Acute OT will follow to facilitate safe DC home.however no OT needed  after DC.      If plan is discharge home, recommend the following:   A little help with walking and/or transfers;A little help with bathing/dressing/bathroom;Direct supervision/assist for medications management;Assist for transportation     Functional Status Assessment   Patient has had a recent decline in their functional status and demonstrates the ability to make significant improvements in function in a reasonable and predictable amount of time.     Equipment Recommendations   Tub/shower bench     Recommendations for Other Services         Precautions/Restrictions   Precautions Precautions: Fall Precaution/Restrictions Comments: frequent falls, orthostatic Required Braces or Orthoses: Other Brace Other Brace: ankle brace L ankle; asked for Cam Boot if possible due to L heel pain with weight bearing Restrictions Weight  Bearing Restrictions Per Provider Order: No     Mobility Bed Mobility               General bed mobility comments: EOB on entry with PT    Transfers Overall transfer level: Needs assistance Equipment used: Rolling walker (2 wheels) Transfers: Sit to/from Stand, Bed to chair/wheelchair/BSC Sit to Stand: Min assist                  Balance Overall balance assessment: Needs assistance, History of Falls Sitting-balance support: No upper extremity supported, Feet supported Sitting balance-Leahy Scale: Good     Standing balance support: During functional activity, Bilateral upper extremity supported Standing balance-Leahy Scale: Fair Standing balance comment: can static stand without UE support, BUE support for gait and pain management                           ADL either performed or assessed with clinical judgement   ADL Overall ADL's : Needs assistance/impaired Eating/Feeding: Independent   Grooming: Set up;Sitting   Upper Body Bathing: Set up;Sitting   Lower Body Bathing: Minimal assistance;Sit to/from stand   Upper Body Dressing : Set up;Sitting   Lower Body Dressing: Minimal assistance;Sit to/from stand   Toilet Transfer: Contact guard assist;Ambulation   Toileting- Clothing Manipulation and Hygiene: Supervision/safety       Functional mobility during ADLs: Contact guard assist;Rolling walker (2 wheels);Cueing for safety General ADL Comments: Family able to assist at DC     Vision Baseline Vision/History: 1 Wears glasses Vision Assessment?: No apparent visual deficits     Perception  Praxis         Pertinent Vitals/Pain Pain Assessment Pain Assessment: Faces Faces Pain Scale: Hurts little more Pain Location: L ankle, toes, heel Pain Descriptors / Indicators: Discomfort, Sharp Pain Intervention(s): Limited activity within patient's tolerance     Extremity/Trunk Assessment Upper Extremity Assessment Upper Extremity  Assessment: Overall WFL for tasks assessed   Lower Extremity Assessment Lower Extremity Assessment: Defer to PT evaluation LLE Deficits / Details: hx of stroke with L sided deficits. pt reports numbness and tingling to L shin that is not new from fall. pain to ankle with limited ability to point/flex toes and significant weakness with DF/PF and ankle eversion compared to R ankle. pain to touch around lateral malleolus and heel LLE: Unable to fully assess due to pain LLE Sensation: decreased light touch LLE Coordination: WNL   Cervical / Trunk Assessment Cervical / Trunk Assessment: Normal   Communication Communication Communication: Impaired Factors Affecting Communication: Hearing impaired   Cognition Arousal: Alert Behavior During Therapy: Flat affect Cognition: No apparent impairments                               Following commands: Intact       Cueing  General Comments   Cueing Techniques: Verbal cues  44/69 supine and eventually after activity his BP dropped to 87/51 (standing).Educated to keep L ankle elevated/ice, encouraged movement   Exercises  L LE elevated; encouraged ice   Shoulder Instructions      Home Living Family/patient expects to be discharged to:: Private residence Living Arrangements: Spouse/significant other Available Help at Discharge: Family;Available 24 hours/day Type of Home: House Home Access: Stairs to enter [employer] of Steps: 4-5 Entrance Stairs-Rails: Right;Left;Can reach both Home Layout: One level     Bathroom Shower/Tub: Chief Strategy Officer: Standard Bathroom Accessibility: Yes How Accessible: Accessible via walker Home Equipment: Cane - single point;Shower seat;Hand held shower head          Prior Functioning/Environment Prior Level of Function : Independent/Modified Independent;History of Falls (last six months);Driving             Mobility Comments: use of SPC, 3-4 falls  needing assist from family to get up. likes to be active, out in yard ADLs Comments: pt reports independence, copoking simple meals, working in yard    OT Problem List: Decreased strength;Decreased range of motion;Decreased activity tolerance;Impaired balance (sitting and/or standing);Decreased safety awareness;Decreased knowledge of use of DME or AE;Cardiopulmonary status limiting activity;Pain   OT Treatment/Interventions: Self-care/ADL training;Therapeutic exercise;DME and/or AE instruction;Energy conservation;Therapeutic activities;Patient/family education;Balance training      OT Goals(Current goals can be found in the care plan section)   Acute Rehab OT Goals Patient Stated Goal: go to his sister's funeral tomorrow OT Goal Formulation: With patient Time For Goal Achievement: 10/17/23 Potential to Achieve Goals: Good   OT Frequency:  Min 2X/week    Co-evaluation              AM-PAC OT "6 Clicks" Daily Activity     Outcome Measure Help from another person eating meals?: None Help from another person taking care of personal grooming?: A Little Help from another person toileting, which includes using toliet, bedpan, or urinal?: A Little Help from another person bathing (including washing, rinsing, drying)?: A Little Help from another person to put on and taking off regular upper body clothing?: A Little Help from another person to put on and taking  off regular lower body clothing?: A Little 6 Click Score: 19   End of Session Equipment Utilized During Treatment: Gait belt;Rolling walker (2 wheels) Nurse Communication: Mobility status;Other (comment) (DC needs)  Activity Tolerance: Patient tolerated treatment well Patient left: in chair;with call bell/phone within reach;with family/visitor present  OT Visit Diagnosis: Unsteadiness on feet (R26.81);Other abnormalities of gait and mobility (R26.89);Repeated falls (R29.6) (falls due to syncope)                Time:  1610-9604 OT Time Calculation (min): 48 min Charges:  OT General Charges $OT Visit: 1 Visit OT Evaluation $OT Eval Moderate Complexity: 1 Mod  Levita Monical, OT/L   Acute OT Clinical Specialist Acute Rehabilitation Services Pager [phone] Office [phone]   [facility] 10/03/2023, 5:00 PM

## 2023-10-03 NOTE — Discharge Instructions (Signed)
 Gabriel Gomez

## 2023-10-03 NOTE — Assessment & Plan Note (Signed)
 Per cardiology this is typically a benign rhythm that would not cause syncope.  But can be an indication of ischemia.  Reassuringly troponins trended flat and patient is not having symptoms of ACS. -Cardiology consulted, recommendations appreciated -Cardiac CT planned

## 2023-10-03 NOTE — Assessment & Plan Note (Signed)
 Systolically elevated 170s to 190s.  Mildly elevated diastolic.  Notable decrease with standing to 110/52.  - Hold home lisinopril - May continue metoprolol  for now but low threshold to discontinue if symptoms persist with ambulation - Titrate BP to standing

## 2023-10-03 NOTE — Progress Notes (Signed)
 Progress Note  Patient Name: Gabriel Gomez Date of Encounter: 10/03/2023  Primary Cardiologist: Hazle Lites, MD   Subjective   No chest pain or sob. No palpitations. C/o left ankle pain.  Inpatient Medications    Scheduled Meds:  acetaminophen   1,000 mg Oral Q6H   apixaban  5 mg Oral BID   atorvastatin   80 mg Oral Daily   DULoxetine  60 mg Oral Daily   feeding supplement  237 mL Oral BID BM   insulin  aspart  0-9 Units Subcutaneous TID WC   insulin  glargine-yfgn  25 Units Subcutaneous Daily   metoprolol  tartrate  25 mg Oral BID   pantoprazole   40 mg Oral Daily   pregabalin   100 mg Oral Daily   Continuous Infusions:  magnesium  sulfate bolus IVPB     PRN Meds: albuterol , mouth rinse, traMADol    Vital Signs    Vitals:   10/03/23 0137 10/03/23 0215 10/03/23 0510 10/03/23 0805  BP: (!) 168/78 (!) 169/87 (!) 178/73 (!) 165/75  Pulse: 64 (!) 58 63 63  Resp: 18 18 19 18   Temp: 97.9 F (36.6 C) 98.2 F (36.8 C) 97.7 F (36.5 C) 99.3 F (37.4 C)  TempSrc:  Oral  Oral  SpO2: 99%  100% 98%  Weight:      Height:        Intake/Output Summary (Last 24 hours) at 10/03/2023 1028 Last data filed at 10/03/2023 8119 Gross per 24 hour  Intake 500 ml  Output 1500 ml  Net -1000 ml   Filed Weights   10/02/23 1511  Weight: 81.6 kg    Telemetry    Nsr with AIVR - Personally Reviewed  ECG    none - Personally Reviewed  Physical Exam   GEN: No acute distress.   Neck: No JVD Cardiac: RRR, no murmurs, rubs, or gallops.  Respiratory: Clear to auscultation bilaterally. GI: Soft, nontender, non-distended  MS: left ankle in a boot tender and swollen Neuro:  Nonfocal  Psych: Normal affect   Labs    Chemistry Recent Labs  Lab 10/02/23 1548 10/02/23 1701 10/03/23 0717  NA 137 139 134*  K 3.2* 3.3* 4.0  CL 103 101 102  CO2 24  --  24  GLUCOSE 122* 120* 218*  BUN 11 12 10   CREATININE 1.21 1.30* 1.05  CALCIUM  8.5*  --  8.4*  PROT 5.6*  --   --   ALBUMIN  3.0*  --   --   AST 16  --   --   ALT 19  --   --   ALKPHOS 67  --   --   BILITOT 0.6  --   --   GFRNONAA >60  --  >60  ANIONGAP 10  --  8     Hematology Recent Labs  Lab 10/02/23 1548 10/02/23 1701 10/03/23 0717  WBC 9.6  --  8.4  RBC 3.65*  --  3.58*  HGB 11.9* 12.2* 11.8*  HCT 34.2* 36.0* 33.4*  MCV 93.7  --  93.3  MCH 32.6  --  33.0  MCHC 34.8  --  35.3  RDW 12.0  --  11.9  PLT 321  --  310    Cardiac EnzymesNo results for input(s): "TROPONINI" in the last 168 hours. No results for input(s): "TROPIPOC" in the last 168 hours.   BNPNo results for input(s): "BNP", "PROBNP" in the last 168 hours.   DDimer No results for input(s): "DDIMER" in the last 168 hours.  Radiology    DG Ankle Complete Left Result Date: 10/02/2023 CLINICAL DATA:  Marvell Slider, pain, swelling EXAM: LEFT ANKLE COMPLETE - 3+ VIEW; LEFT FOOT - COMPLETE 3+ VIEW COMPARISON:  None Available. FINDINGS: Left ankle: Frontal, oblique, and lateral views of the left ankle are obtained. Prior proximal fibular resection. No acute displaced fracture, subluxation, or dislocation. Joint spaces are well preserved. Soft tissues are unremarkable. Left foot: Frontal, oblique, and lateral views are obtained. No acute displaced fracture, subluxation, or dislocation. Moderate osteoarthritis of the first metatarsophalangeal joint. Mild osteoarthritis throughout the interphalangeal joints. Soft tissues are unremarkable. IMPRESSION: 1. 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 2. No acute displaced fractures. Electronically Signed   By: Bobbye Burrow M.D.   On: 10/02/2023 17:09   DG Foot Complete Left Result Date: 10/02/2023 CLINICAL DATA:  Marvell Slider, pain, swelling EXAM: LEFT ANKLE COMPLETE - 3+ VIEW; LEFT FOOT - COMPLETE 3+ VIEW COMPARISON:  None Available. FINDINGS: Left ankle: Frontal, oblique, and lateral views of the left ankle are obtained. Prior proximal fibular resection. No acute displaced  fracture, subluxation, or dislocation. Joint spaces are well preserved. Soft tissues are unremarkable. Left foot: Frontal, oblique, and lateral views are obtained. No acute displaced fracture, subluxation, or dislocation. Moderate osteoarthritis of the first metatarsophalangeal joint. Mild osteoarthritis throughout the interphalangeal joints. Soft tissues are unremarkable. IMPRESSION: 1. 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 2. No acute displaced fractures. Electronically Signed   By: Bobbye Burrow M.D.   On: 10/02/2023 17:09   CT Thoracic Spine Wo Contrast Result Date: 10/02/2023 CLINICAL DATA:  Marvell Slider, back trauma, near syncope EXAM: CT THORACIC SPINE WITHOUT CONTRAST TECHNIQUE: Multidetector CT images of the thoracic were obtained using the standard protocol without intravenous contrast. RADIATION DOSE REDUCTION: This exam was performed according to the departmental dose-optimization program which includes automated exposure control, adjustment of the mA and/or kV according to patient size and/or use of iterative reconstruction technique. COMPARISON:  11/30/2022 FINDINGS: Alignment: Alignment is anatomic. Vertebrae: No acute fracture or focal pathologic process. Paraspinal and other soft tissues: Paraspinal soft tissues are unremarkable. Dependent hypoventilatory changes are seen within the lung bases. Disc levels: No significant spondylosis or facet hypertrophy. No bony encroachment upon the central canal or neural foramina. Reconstructed images demonstrate no additional findings. IMPRESSION: 1. No acute thoracic spine fracture. Electronically Signed   By: Bobbye Burrow M.D.   On: 10/02/2023 16:49   CT CERVICAL SPINE WO CONTRAST Result Date: 10/02/2023 CLINICAL DATA:  Marvell Slider, neck trauma EXAM: CT CERVICAL SPINE WITHOUT CONTRAST TECHNIQUE: Multidetector CT imaging of the cervical spine was performed without intravenous contrast. Multiplanar CT image reconstructions  were also generated. RADIATION DOSE REDUCTION: This exam was performed according to the departmental dose-optimization program which includes automated exposure control, adjustment of the mA and/or kV according to patient size and/or use of iterative reconstruction technique. COMPARISON:  None Available. FINDINGS: Alignment: Alignment is anatomic. Skull base and vertebrae: No acute fracture. No primary bone lesion or focal pathologic process. Soft tissues and spinal canal: No prevertebral fluid or swelling. No visible canal hematoma. Disc levels: Mild multilevel spondylosis most pronounced at C5-6 and C6-7. Upper chest: Airway is patent.  Lung apices are clear. Other: Reconstructed images demonstrate no additional findings. IMPRESSION: 1. No acute cervical spine fracture. Electronically Signed   By: Bobbye Burrow M.D.   On: 10/02/2023 16:35   CT HEAD WO CONTRAST Result Date: 10/02/2023 CLINICAL DATA:  Marvell Slider, near syncope, head trauma EXAM: CT HEAD WITHOUT  CONTRAST TECHNIQUE: Contiguous axial images were obtained from the base of the skull through the vertex without intravenous contrast. RADIATION DOSE REDUCTION: This exam was performed according to the departmental dose-optimization program which includes automated exposure control, adjustment of the mA and/or kV according to patient size and/or use of iterative reconstruction technique. COMPARISON:  05/28/2018 FINDINGS: Brain: No acute infarct or hemorrhage. Lateral ventricles and midline structures are unremarkable. No acute extra-axial fluid collections. No mass effect. Vascular: No hyperdense vessel or unexpected calcification. Skull: Normal. Negative for fracture or focal lesion. Sinuses/Orbits: Polypoid mucosal thickening versus mucous retention cyst left maxillary sinus. Remaining paranasal sinuses are clear. Other: None. IMPRESSION: 1. No acute intracranial process. Electronically Signed   By: Bobbye Burrow M.D.   On: 10/02/2023 16:33    Cardiac  Studies   none  Patient Profile     63 y.o. male admitted with orthostasis, passing out when he stood up and resulting in a bad left ankle sprain. Also with AIVR on tele.   Assessment & Plan    Syncope - these episodes are related to orthostasis. He had his meds changed. I would allow him to remain hypertensive to prevent more passing out. AIVR - this rhythm is non-specific. He is asymptomatic. I would recommend an outpatient 2D echo down in [place]. His last was 2 years ago and showed normal systolic function. Vascular disease - note a coronary ct scan is ordered. It cannot be done until Tuesday. He is insistent on going to his sister funeral on Tuesday. I think he can be discharged as all enzymes are negative, allowing permissive HTN, and outpatient CT scan could be considered. He has seen Dr. Sandee Crook remotely.  Donnald Fuss     For questions or updates, please contact [facility] Please consult [email or web address] for contact info under Cardiology/STEMI.      Signed, Manya Sells, MD  10/03/2023, 10:28 AM

## 2023-10-03 NOTE — Plan of Care (Signed)

## 2023-10-03 NOTE — Procedures (Signed)
 Routine EEG Report  Gabriel Gomez is a 63 y.o. male with a history of syncope who is undergoing an EEG to evaluate for seizures.  Report: This EEG was acquired with electrodes placed according to the International 10-20 electrode system (including Fp1, Fp2, F3, F4, C3, C4, P3, P4, O1, O2, T3, T4, T5, T6, A1, A2, Fz, Cz, Pz). The following electrodes were missing or displaced: none.  The occipital dominant rhythm was 6-7 Hz. This activity is reactive to stimulation. Drowsiness was manifested by background fragmentation; deeper stages of sleep were not identified. There was no focal slowing. There were no interictal epileptiform discharges. There were no electrographic seizures identified. There was no abnormal response to photic stimulation or hyperventilation.   Impression and clinical correlation: This EEG was obtained while awake and drowsy and is abnormal due to mild-to-moderate diffuse slowing indicative of global cerebral dysfunction. Epileptiform abnormalities were not seen during this recording.  Greg Leaks, MD [facility] [phone]  If 7pm- 7am, please page neurology on call as listed in AMION.

## 2023-10-03 NOTE — Assessment & Plan Note (Signed)
 BPH-hold Flomax History of stroke-not currently on aspirin , likely in the setting of recent esophagitis, and is on Eliquis PAF-continue Eliquis 5 mg twice daily History of smoking-albuterol  as needed Esophagitis-continue Protonix  40 mg daily HLD-continue atorvastatin  80 mg daily

## 2023-10-03 NOTE — Assessment & Plan Note (Signed)
 Potassium 3.2. Mag 1.4 - Replete as indicated

## 2023-10-03 NOTE — Progress Notes (Signed)
 RNCM received DME orders for RW and tub bench.  Patient's insurance will not pay for tub bench.  Brad at [facility] contacted with DME RW order and confirmation received.  RW to be delivered to patient's room prior to d/c.

## 2023-10-03 NOTE — Hospital Course (Addendum)
 Gabriel Gomez is a 63 y.o. male presenting with multiple syncopal episodes most likely secondary to orthostatic hypotension v. CAD. Pertinent PMH/PSH includes BPH, PAF, esophagitis, HLD. His hospital course is outlined below:   Syncope 2/2 to Orthostatic Hypotension:  Found down by daughter. This one of multiple episodes of falling or staring off into space. In the ED, patient was hypertensive with otherwise stable vitals, lab results showed normal troponins, mild hypokalemia, and were otherwise unremarkable.  CT head neck thoracic spine showed no evidence of fracture.  EKG showed idioventricular rhythm, cardiology was consulted and recommended cardiac CT. Orthostatic vital signs were positive x2, his home blood pressure medications were held on admission along with Flomax. On exam he had notable weakness on the LLE which prompted and stroke workup. MRI brain wo showed stable lacunar infarcts and new lacunar infarct in left corona radiata since prior study. EEG showed no signs of seizure. Neurology was consulted and thought syncope most likely 2/2 to orthostatic hypotension.  Due to his sister's funeral patient wanted to leave AMA prior to his MRI with contrast being completed to complete seizure workup. OT saw prior to [facility] and referral to outpatient OT was placed. He received compression stockings and an abdominal binder was ordered.  He left with the following medication adjustments:  DECREASE Metoprolol  to 12.5 mg BID  HOLD Lisinopril 40 mg  DECREASE Flomax to 0.4 mg daily  HOLD Bethanechol 50 mg   Accelerated Idioventricular Rhythm:  Cardiology consulted and thought rhythm could be from CAD. They recommended CTA coronary to assess blockage. Unfortunately result was still pending at time of AMA.   Left Foot Pain:  DG negative for fractures. Given a CAM boot.    PCP Follow Up:  MRI brain with contrast to evaluate for seizure activity  New evidence of lacunar infarct on MRI. Continue with  statin and diabetic control. Positive orthostatic hypotension without tachycardia most likely secondary to BB.  Titrate blood pressure to standing Follow up foot pain

## 2023-10-03 NOTE — Progress Notes (Addendum)
 Daily Progress Note Intern Pager: [phone]  Patient name: Gabriel Gomez Medical record number: [id number] Date of birth: 01/08/1961 Age: 63 y.o. Gender: male  Primary Care Provider: Patient, No Pcp Per Consultants: Cardiology Code Status: Full code   Pt Overview and Major Events to Date:  5/24: Admitted  5/25: Coronary CTA planned, Consulted Neurology   Assessment and Plan:  Gabriel Gomez is a 63 y.o. male presenting with multiple syncopal episodes most likely secondary to orthostatic hypotension v. CAD. Pertinent PMH/PSH includes BPH, PAF, esophagitis, HLD.   Due to the patient having several spells of staring off into space concerning for possible seizure like activity.  A spot EEG has been ordered.  Will also obtain an MRI brain due to chronic left lower extremity weakness with concern for possible stroke.  Will also reach out to neurology team for consult due to abnormal neurological symptoms. Assessment & Plan Syncope and collapse Stable. 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 Possibly related to seizure-like activity? 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  - ECHO - Orthostatics - Consider spot EEG - Hold antihypertensives Hypertension associated with diabetes (HCC) Systolically elevated 170s to 190s.  Mildly elevated diastolic.  Notable decrease with standing to 110/52.  - Hold home lisinopril - May continue metoprolol  for now but low threshold to discontinue if symptoms persist with ambulation - Titrate BP to standing  Hypokalemia Potassium 3.2. Mag 1.4 - Replete as indicated Type 2 diabetes mellitus with neurological complications (HCC) A1c 8.4  Previously controlled.  Home regimen includes Lantus  25 units, NovoLog  15 - 10 - 10 with each meal. - Continue Lantus  25 units - Hold home NovoLog  - Sensitive sliding scale with meals - Hold home pregabalin  for peripheral neuropathy given syncope and concern for sedation - Continue duloxetine 60 mg  daily Accelerated idioventricular rhythm (HCC) Per cardiology this is typically a benign rhythm that would not cause syncope.  But can be an indication of ischemia.  Reassuringly troponins trended flat and patient is not having symptoms of ACS. -Cardiology consulted, recommendations appreciated -Cardiac CT planned  Left ankle sprain Exam consistent with left ankle sprain. - Lace up ankle brace - PT/OT evaluation  - Tylenol  as needed for pain - Hold home tramadol  due to sedation Chronic health problem BPH-hold Flomax History of stroke-not currently on aspirin , likely in the setting of recent esophagitis, and is on Eliquis PAF-continue Eliquis 5 mg twice daily History of smoking-albuterol  as needed Esophagitis-continue Protonix  40 mg daily HLD-continue atorvastatin  80 mg daily  FEN/GI: soft diet PPx: Eliquis  Dispo:Home pending clinical improvement . Barriers include ongoing medical workup.   Subjective:  Did not sleep well last night. Still have symptoms of near-syncope without LOC while in the hospital. Thinks his blood pressure has been over treated.   Objective: Temp:  [97.7 F (36.5 C)-99.3 F (37.4 C)] 99.3 F (37.4 C) (05/25 0805) Pulse Rate:  [57-69] 63 (05/25 0805) Resp:  [13-30] 18 (05/25 0805) BP: (110-200)/(52-89) 165/75 (05/25 0805) SpO2:  [98 %-100 %] 98 % (05/25 0805) Weight:  [81.6 kg] 81.6 kg (05/24 1511) Physical Exam: General: NAD, well-appearing  Cardiovascular: RRR, no murmurs on exam  Respiratory: clear, no increased work of breathing  Abdomen: soft, non-tender Extremities: left lace up ankle brace in place, no peripheral edema   Laboratory: Most recent CBC Lab Results  Component Value Date   WBC 8.4 10/03/2023   HGB 11.8 (L) 10/03/2023   HCT 33.4 (L) 10/03/2023   MCV 93.3 10/03/2023  PLT 310 10/03/2023   Most recent BMP    Latest Ref Rng & Units 10/03/2023    7:17 AM  BMP  Glucose 70 - 99 mg/dL 161   BUN 8 - 23 mg/dL 10   Creatinine  0.96 - 1.24 mg/dL 0.45   Sodium 409 - 811 mmol/L 134   Potassium 3.5 - 5.1 mmol/L 4.0   Chloride 98 - 111 mmol/L 102   CO2 22 - 32 mmol/L 24   Calcium  8.9 - 10.3 mg/dL 8.4     Other pertinent labs A1c 8.4, TSH 2.18  Imaging/Diagnostic Tests: CTA coronary pending   Clem Currier, DO 10/03/2023, 8:53 AM  PGY-2, [facility] FPTS Intern pager: [phone], text pages welcome Secure chat group [facility] Teaching Service

## 2023-10-06 NOTE — Progress Notes (Unsigned)
 Cardiology Clinic Note   Patient Name: Gabriel Gomez Date of Encounter: 10/07/2023  Primary Care Provider:  Manfred Seed, MD Primary Cardiologist:  None  Patient Profile    Gabriel Gomez 63 year old male presents to the clinic today for follow-up evaluation of his syncope.  Past Medical History    Past Medical History:  Diagnosis Date   Arthritis    COPD (chronic obstructive pulmonary disease) (HCC)    Coronary artery disease    Diabetes mellitus without complication (HCC)    GERD (gastroesophageal reflux disease)    Past Surgical History:  Procedure Laterality Date   CHONDROPLASTY Right 12/15/2021   Procedure: MEDIAL COMPARTMENT CHONDROPLASTY;  Surgeon: Adah Acron, MD;  Location: [facility];  Service: Orthopedics;  Laterality: Right;   JOINT REPLACEMENT Left    KNEE ARTHROSCOPY Right 12/15/2021   Procedure: RIGHT KNEE ARTHROSCOPY;  Surgeon: Adah Acron, MD;  Location: [facility];  Service: Orthopedics;  Laterality: Right;   LEFT HEART CATH AND CORONARY ANGIOGRAPHY N/A 05/30/2018   Procedure: LEFT HEART CATH AND CORONARY ANGIOGRAPHY;  Surgeon: Arnoldo Lapping, MD;  Location: [facility] INVASIVE CV LAB;  Service: Cardiovascular;  Laterality: N/A;   liver laceration  1979    Allergies  Allergies  Allergen Reactions   Meloxicam  Swelling    Both legs   Morphine  Other (See Comments)    Causes severe confusion and aggression   Tape Other (See Comments)    Not an allergy, daughter wants it added because it tears his skin    History of Present Illness    Gabriel Gomez has a PMH of vascular disease, syncope, hypertension, insulin -dependent diabetes mellitus, HLD, lumbar radiculopathy, and accelerated idioventricular rhythm.  Underwent cardiac catheterization 1/20 which showed proximal RCA 30%, mid RCA 30%, distal RCA 40% distal left main-ostial LAD 40%, proximal LAD 40% ostial first diagonal 30% proximal circumflex-mid circumflex 40% and  100% posterior anterior lesion (medical management was recommended  He was admitted to the hospital on 10/03/2023 and discharged the same day.  He reported passing out when he stood up which resulted in a bad ankle sprain.  He was diagnosed with orthostatic hypotension.  EP was consulted.  His blood pressure medications were changed.  EP felt that he should be allowed to be hypertensive to prevent future episodes of presyncope or syncope.  For his accelerated idioventricular rhythm outpatient echocardiogram and Aspro was recommended.  It was noted that he had a echocardiogram 2 years prior which showed normal systolic function.  Coronary CT was also ordered for evaluation of his vascular disease.  He was discharged in stable condition.  His cardiac enzymes were negative.  He insisted on going home for his sister's funeral on Tuesday.  He presents to the clinic today for follow-up evaluation and states he continues to notice fluctuations with his blood pressure.  In the clinic today his blood pressure is 156/68.  He reports that yesterday at his PCP office his blood pressure was in the high 90s over 60s.  We reviewed his recent emergency department visit and recommendations from electrophysiology.  He had recent echocardiogram done on 09/15/2023 at [facility] in [place] .  It showed EF of 55 to 60%, left atria mild dilation, and no significant valvular abnormalities.  We reviewed the recommendations for progressive blood pressure.  He does note intermittent episodes of chest pressure with walking.  He reports that he does not have nitroglycerin .  We are awaiting results of coronary  calcium  scoring.  I will continue his current medication therapy, refill his nitroglycerin  and plan follow-up in 3 to 4 months with Dr. Krasowski..  Today he denies  shortness of breath, palpitations, melena, hematuria, hemoptysis, diaphoresis, presyncope, syncope, orthopnea, and PND.   Home Medications     Prior to Admission medications   Medication Sig Start Date End Date Taking? Authorizing Provider  ACCU-CHEK GUIDE test strip USE ONE strip FOUR TIMES DAILY 06/24/22   Cyndi Drain, PA-C  Accu-Chek Softclix Lancets lancets Use as instructed 06/25/21   Cyndi Drain, PA-C  albuterol  (VENTOLIN  HFA) 108 (90 Base) MCG/ACT inhaler Inhale 2 puffs into the lungs every 6 (six) hours as needed for wheezing or shortness of breath. 09/23/22   Cox, Burleigh Carp, MD  apixaban (ELIQUIS) 5 MG TABS tablet Take 5 mg by mouth 2 (two) times daily.    [provider]  atorvastatin  (LIPITOR ) 80 MG tablet TAKE ONE TABLET BY MOUTH ONCE DAILY AT 6pm Patient taking differently: Take 80 mg by mouth every morning. 12/04/21   Cyndi Drain, PA-C  bethanechol (URECHOLINE) 50 MG tablet Take 50 mg by mouth 2 (two) times daily. 09/08/23   [provider]  Blood Glucose Monitoring Suppl (ACCU-CHEK GUIDE) w/Device KIT To use to check glucose qd 06/16/21   Cyndi Drain, PA-C  DULoxetine (CYMBALTA) 60 MG capsule Take 60 mg by mouth daily. 08/04/21   [provider]  LANTUS  SOLOSTAR 100 UNIT/ML Solostar Pen Inject 30 Units into the skin at bedtime. Patient taking differently: Inject 30 Units into the skin at bedtime. 10/19/21   CoxBurleigh Carp, MD  metoprolol  tartrate (LOPRESSOR ) 25 MG tablet Take 0.5 tablets (12.5 mg total) by mouth 2 (two) times daily. 10/03/23   Clem Currier, DO  nitroGLYCERIN  (NITROSTAT ) 0.4 MG SL tablet Place 1 tablet (0.4 mg total) under the tongue every 5 (five) minutes x 3 doses as needed for chest pain. Patient not taking: Reported on 10/02/2023 05/30/18   Barrett, Andra Kava, PA-C  NOVOLOG  FLEXPEN 100 UNIT/ML FlexPen inject 15 units EVERY MORNING, 10 units AT LUNCH AND 10 units EVERY EVENING 02/10/21   Davis, Sara, PA-C  omeprazole  (PRILOSEC) 20 MG capsule TAKE ONE CAPSULE BY MOUTH ONCE DAILY AS NEEDED FOR reflux Patient not taking: Reported on 10/02/2023 11/17/21   Cyndi Drain, PA-C  pregabalin  (LYRICA ) 100  MG capsule Take 100 mg by mouth 2 (two) times daily.    [provider]  sucralfate (CARAFATE) 1 GM/10ML suspension Take 1 g by mouth 4 (four) times daily -  with meals and at bedtime. 10/02/23   [provider]  SURE COMFORT PEN NEEDLES 32G X 4 MM MISC AS DIRECTED FOUR TIMES DAILY 01/25/23   Cox, Burleigh Carp, MD  tamsulosin (FLOMAX) 0.4 MG CAPS capsule Take 1 capsule (0.4 mg total) by mouth daily. 10/03/23   Clem Currier, DO  traMADol  (ULTRAM ) 50 MG tablet Take 50 mg by mouth every 8 (eight) hours as needed for moderate pain (pain score 4-6). 09/24/23   [provider]    Family History    Family History  Problem Relation Age of Onset   CAD Father    He indicated that his mother is deceased. He indicated that his father is deceased.  Social History    Social History   Socioeconomic History   Marital status: Married    Spouse name: Not on file   Number of children: Not on file   Years of education: Not on file   Highest education level:  Not on file  Occupational History   Not on file  Tobacco Use   Smoking status: Every Day    Current packs/day: 1.00    Types: Cigarettes   Smokeless tobacco: Never   Tobacco comments:    10/07/2023 Patient smokes close to a pack daily depending on the day  Substance and Sexual Activity   Alcohol use: Yes    Comment: social beer   Drug use: Never   Sexual activity: Yes    Partners: Female  Other Topics Concern   Not on file  Social History Narrative   Not on file   Social Drivers of Health   Financial Resource Strain: Not on file  Food Insecurity: No Food Insecurity (10/03/2023)   Hunger Vital Sign    Worried About Running Out of Food in the Last Year: Never true    Ran Out of Food in the Last Year: Never true  Transportation Needs: No Transportation Needs (10/03/2023)   PRAPARE - Administrator, Civil Service (Medical): No    Lack of Transportation (Non-Medical): No  Physical Activity: Not on file   Stress: Not on file  Social Connections: Moderately Isolated (10/03/2023)   Social Connection and Isolation Panel [NHANES]    Frequency of Communication with Friends and Family: More than three times a week    Frequency of Social Gatherings with Friends and Family: Once a week    Attends Religious Services: Never    Database administrator or Organizations: No    Attends Banker Meetings: 1 to 4 times per year    Marital Status: Widowed  Intimate Partner Violence: Not At Risk (10/03/2023)   Humiliation, Afraid, Rape, and Kick questionnaire    Fear of Current or Ex-Partner: No    Emotionally Abused: No    Physically Abused: No    Sexually Abused: No     Review of Systems    General:  No chills, fever, night sweats or weight changes.  Cardiovascular:  No chest pain, dyspnea on exertion, edema, orthopnea, palpitations, paroxysmal nocturnal dyspnea. Dermatological: No rash, lesions/masses Respiratory: No cough, dyspnea Urologic: No hematuria, dysuria Abdominal:   No nausea, vomiting, diarrhea, bright red blood per rectum, melena, or hematemesis Neurologic:  No visual changes, wkns, changes in mental status. All other systems reviewed and are otherwise negative except as noted above.  Physical Exam    VS:  BP (!) 156/68 (BP Location: Left Arm, Patient Position: Sitting, Cuff Size: Normal)   Pulse 72   Ht 5\' 10"  (1.778 m)   Wt 189 lb 6.4 oz (85.9 kg)   SpO2 93%   BMI 27.18 kg/m  , BMI Body mass index is 27.18 kg/m. GEN: Well nourished, well developed, in no acute distress. HEENT: normal. Neck: Supple, no JVD, carotid bruits, or masses. Cardiac: RRR, no murmurs, rubs, or gallops. No clubbing, cyanosis, edema.  Radials/DP/PT 2+ and equal bilaterally.  Respiratory:  Respirations regular and unlabored, clear to auscultation bilaterally. GI: Soft, nontender, nondistended, BS + x 4. MS: no deformity or atrophy. Skin: warm and dry, no rash. Neuro:  Strength and sensation  are intact. Psych: Normal affect.  Accessory Clinical Findings    Recent Labs: 10/02/2023: ALT 19 10/03/2023: BUN 10; Creatinine, Ser 1.05; Hemoglobin 11.8; Magnesium  1.4; Platelets 310; Potassium 4.0; Sodium 134; TSH 2.189   Recent Lipid Panel    Component Value Date/Time   CHOL 154 12/10/2021 0852   TRIG 82 12/10/2021 0852   HDL 74 12/10/2021  0852   CHOLHDL 2.1 12/10/2021 0852   CHOLHDL 4.9 05/29/2018 0351   VLDL 29 05/29/2018 0351   LDLCALC 65 12/10/2021 0852       ECG personally reviewed by me today-none today.     CARDIAC CATH: 05/30/2018 Prox RCA lesion is 30% stenosed. Mid RCA lesion is 30% stenosed. Dist RCA lesion is 40% stenosed. Dist LM to Ost LAD lesion is 40% stenosed. Prox LAD lesion is 40% stenosed. Ost 1st Diag lesion is 30% stenosed. Prox Cx to Mid Cx lesion is 40% stenosed. Post Atrio lesion is 100% stenosed.   1. Mild diffuse nonobstructive CAD as outlined above (no lesions greater than 50% in any of the major epicardial vessels) 2. Normal LV function by echo and normal LVEDP 3. There is a distal RCA branch vessel occlusion involving a tiny PLA vessel < 1 mm in caliber   Suspect noncardiac CP. Recommend medical therapy, tobacco cessation for risk reduction.     ECHO: 05/30/2018 - Left ventricle: The cavity size was normal. Wall thickness was   increased in a pattern of mild LVH. Systolic function was normal.   The estimated ejection fraction was in the range of 60% to 65%.   Wall motion was normal; there were no regional wall motion   abnormalities. Features are consistent with a pseudonormal left   ventricular filling pattern, with concomitant abnormal relaxation   and increased filling pressure (grade 2 diastolic dysfunction). - Aortic valve: There was no stenosis. - Mitral valve: Mildly calcified annulus. There was no significant   regurgitation. - Right ventricle: The cavity size was normal. Systolic function   was normal. - Pulmonary  arteries: No complete TR doppler jet so unable to   estimate PA systolic pressure. - Systemic veins: IVC measured 2.5 cm with < 50% respirophasic   variation, suggesting RA pressure 15 mmHg.   Impressions:   - Normal LV size with mild LV hypertrophy. Moderate diastolic   dysfunction. Normal RV size and systolic function. No significant   valvular abnormalities. Dilated IVC suggests elevated RV filling   pressure.     Assessment & Plan   1.  Syncope-denies further episodes of lightheadedness, presyncope or syncope.  Is seen and evaluated by EP in the hospital who recommended submissive hypertension to prevent further episodes.  BP today 156/68. Monitor blood pressure Heart healthy diet  Accelerated idioventricular rhythm-continues to be asymptomatic with this.  Denies episodes of irregular heartbeat or accelerated heart rhythms.  EP plan for repeat echocardiogram in [place].  Had recent echocardiogram at [facility] on 09/15/2023.  It showed normal LVEF, mild left atrium dilation and no significant valvular abnormalities.  Scanned into patient's chart Defer echocardiogram at this time.  Hyperlipidemia-LDL 65 on 12/10/2021. High-fiber diet Continue atorvastatin   Coronary artery disease, unstable angina-no chest pain today.   Denies increased work of breathing.  Somewhat physically active, limited by left ankle sprain. Await read else of coronary calcium  scoring Heart healthy low-sodium diet Continue metoprolol , atorvastatin   Disposition: Follow-up with Dr. Gordan Latina in 3-4 months.  Chet Cota. Chandy Tarman NP-C     10/07/2023, 11:03 AM [facility] [address] Office [phone] Fax [phone]    I spent 15 minutes examining this patient, reviewing medications, and using patient centered shared decision making involving their cardiac care.   I spent  20 minutes reviewing past medical history,  medications, and prior cardiac tests.

## 2023-10-07 ENCOUNTER — Ambulatory Visit: Attending: General Practice | Admitting: General Practice

## 2023-10-07 ENCOUNTER — Encounter: Payer: Self-pay | Admitting: General Practice

## 2023-10-07 VITALS — BP 156/68 | HR 72 | Ht 70.0 in | Wt 189.4 lb

## 2023-10-07 DIAGNOSIS — I2510 Atherosclerotic heart disease of native coronary artery without angina pectoris: Secondary | ICD-10-CM

## 2023-10-07 DIAGNOSIS — I442 Atrioventricular block, complete: Secondary | ICD-10-CM | POA: Diagnosis not present

## 2023-10-07 DIAGNOSIS — R55 Syncope and collapse: Secondary | ICD-10-CM | POA: Diagnosis not present

## 2023-10-07 MED ORDER — NITROGLYCERIN 0.4 MG SL SUBL
0.4000 mg | SUBLINGUAL_TABLET | SUBLINGUAL | 12 refills | Status: AC | PRN
Start: 2023-10-07 — End: ?

## 2023-10-07 NOTE — Patient Instructions (Addendum)
 Medication Instructions:  Your physician recommends that you continue on your current medications as directed. Please refer to the Current Medication list given to you today.  *If you need a refill on your cardiac medications before your next appointment, please call your pharmacy*  Lab Work: None ordered  If you have labs (blood work) drawn today and your tests are completely normal, you will receive your results only by: MyChart Message (if you have MyChart) OR A paper copy in the mail If you have any lab test that is abnormal or we need to change your treatment, we will call you to review the results.  Testing/Procedures: None ordered  Follow-Up: At [facility], you and your health needs are our priority.  As part of our continuing mission to provide you with exceptional heart care, our providers are all part of one team.  This team includes your primary Cardiologist (physician) and Advanced Practice Providers or APPs (Physician Assistants and Nurse Practitioners) who all work together to provide you with the care you need, when you need it.  Your next appointment:   3 month(s)  Provider:    Dr. Gordan Latina  We recommend signing up for the patient portal called "MyChart".  Sign up information is provided on this After Visit Summary.  MyChart is used to connect with patients for Virtual Visits (Telemedicine).  Patients are able to view lab/test results, encounter notes, upcoming appointments, etc.  Non-urgent messages can be sent to your provider as well.   To learn more about what you can do with MyChart, go to [email or web address].   Other Instructions     We don't want your blood pressure to get over 160 on the top #, please keep an eye out on this.

## 2023-10-12 NOTE — Telephone Encounter (Signed)
 Spoke with patient's daughter/POA Abe Abed (OK per [facility]). She states patient's blood pressure continues to drop low, but does not stay low. She reports her mother checked patient's BP today and had a reading of 74/56.  Abe Abed states she is told patient drinks plenty of fluids though she cannot be sure this is true. She reports she advised her mother to give patient liquid IV in his water today because his sodium level was low in the hospital. She also reports patient is not eating well due to ongoing esophageal issues.  She reports patient is not taking metoprolol .  Instructed Abe Abed to have patient drink at least 64 oz of water daily, and up to 100 oz daily if not already. May use liquid IV, no more than 2 drink sticks per day as to not overdo sodium intake. Also recommended patient wear compression socks to help prevent BP dropping, and changing positions slowly.  CT results not reviewed by MD yet, we will call when completed. Abe Abed requests we call her with results at [phone].  Abe Abed verbalized understanding of the above and will call with any changes.

## 2023-10-12 NOTE — Telephone Encounter (Signed)
 Called Gabriel Gomez to discuss MyChart  message, left message for her to callback or respond to MyChart message.

## 2023-10-13 NOTE — Telephone Encounter (Signed)
 Spoke with pt's daughter. Pt's BP's continue to be low, he passed out a few times. Reports that he did not hit his head.  She asked if the CT has been read yet- informed that the results are not in yet.   Instructed to take the patient to the ER for evaluation.  Seems reluctant to take him/and says he is stubborn. Informed that it is highly recommended that he go to the ER.  Asked about his blood sugar as well- she reports that she wonders if they are getting too low too? I again, stressed the importance of taking the patient to the ER to stabilize his vital signs and blood sugar.  She verbalized understanding.  Will send this information to provider.

## 2023-10-14 ENCOUNTER — Ambulatory Visit: Payer: Self-pay | Admitting: Cardiology

## 2023-10-14 ENCOUNTER — Encounter: Payer: Self-pay | Admitting: Cardiology

## 2023-10-14 NOTE — Telephone Encounter (Signed)
 Spoke with pt regarding an appointment to see Pattricia Bores, NP with Dr. Sandee Crook being out. Able to schedule pt for Tuesday, 6/10 at 2:45pm. Pt verbalizes understanding.

## 2023-10-18 NOTE — Progress Notes (Signed)
 Cardiology Office Note   Date:  10/19/2023  ID:  Gabriel Gomez, DOB 11-20-1960, MRN [id number] PCP: Melva Stabile, MD  [facility] Providers Cardiologist:  Zoe Hinds, MD     History of Present Illness Gabriel Gomez is a 63 y.o. male with a past medical history of nonobstructive CAD per left heart cath, PAD, insulin -dependent diabetes mellitus, dyslipidemia, hypertension, accelerated idioventricular rhythm, history on stroke on Eliqius.  10/08/2023 calcium  score Of 589, 85th percentile 09/15/2023 echo EF 55-60%, mild concentric LVH, LA mildly dilated, mild aortic sclerosis 10/31/2021 ABI right ABI indicates mild extremity arterial disease; left ABI indicates moderate to borderline severe arterial disease 05/30/2018 cardiac cath mild diffuse nonobstructive CAD, recommendations for medical therapy 06/19/2021 echo EF 60 to 65%, grade 2 DD, mild calcification of aortic valve  [facility] established with [facility] on 10/07/2023, he had recently been admitted to the hospital and discharged on the same day for syncopal episode that occurred after he stood up, EP had been consulted with recommendations to allow for permissive hypertension.   He presents today accompanied by his wife with concerns of dizziness and syncope.  He recently had another episode of syncope that occurred during the night when he got up to go to the bathroom.  They have been keeping track of his blood pressure readings at home and they largely appear to be well-controlled. He is on several medications that can contribute to dizziness and we discussed his syncope during the night was likely related to micturition syncope and to sit when voiding especially at night.    ROS: Review of Systems  Neurological:  Positive for dizziness, tremors and weakness.  All other systems reviewed and are negative.    Studies Reviewed      Cardiac Studies & Procedures    ______________________________________________________________________________________________ CARDIAC CATHETERIZATION  CARDIAC CATHETERIZATION 05/30/2018  Conclusion  Prox RCA lesion is 30% stenosed.  Mid RCA lesion is 30% stenosed.  Dist RCA lesion is 40% stenosed.  Dist LM to Ost LAD lesion is 40% stenosed.  Prox LAD lesion is 40% stenosed.  Ost 1st Diag lesion is 30% stenosed.  Prox Cx to Mid Cx lesion is 40% stenosed.  Post Atrio lesion is 100% stenosed.  1. Mild diffuse nonobstructive CAD as outlined above (no lesions greater than 50% in any of the major epicardial vessels) 2. Normal LV function by echo and normal LVEDP 3. There is a distal RCA branch vessel occlusion involving a tiny PLA vessel < 1 mm in caliber  Suspect noncardiac CP. Recommend medical therapy, tobacco cessation for risk reduction.  Findings Coronary Findings Diagnostic  Dominance: Right  Left Main Dist LM to Ost LAD lesion is 40% stenosed.  Left Anterior Descending Prox LAD lesion is 40% stenosed.  First Diagonal Branch Ost 1st Diag lesion is 30% stenosed.  Left Circumflex Prox Cx to Mid Cx lesion is 40% stenosed.  Right Coronary Artery Prox RCA lesion is 30% stenosed. Mid RCA lesion is 30% stenosed. Dist RCA lesion is 40% stenosed.  Right Posterior Atrioventricular Artery Post Atrio lesion is 100% stenosed. distal occlusion, tiny vessel  Intervention  No interventions have been documented.     ECHOCARDIOGRAM  ECHOCARDIOGRAM COMPLETE 06/19/2021  Narrative ECHOCARDIOGRAM REPORT    Patient Name:   [facility] Date of Exam: 06/19/2021 Medical Rec #:  [id number]       Height:       70.0 in Accession #:    [id number]      Weight:  186.0 lb Date of Birth:  25-Sep-1960       BSA:          2.024 m Patient Age:    61 years        BP:           146/76 mmHg Patient Gender: M               HR:           76 bpm. Exam Location:  [place]  Procedure: 2D Echo, Cardiac Doppler,  Color Doppler and Strain Analysis  Indications:    Aortic valve calcification [I35.9 (ICD-10-CM)]  History:        Patient has prior history of Echocardiogram examinations, most recent 05/30/2018. Aortic Valve Disease; Risk Factors:Hypertension and Dyslipidemia.  Sonographer:    Erminia Hazel RDCS Referring Phys: [phone] SARA DAVIS  IMPRESSIONS   1. GLS -16.3. Left ventricular ejection fraction, by estimation, is 60 to 65%. The left ventricle has normal function. The left ventricle has no regional wall motion abnormalities. Left ventricular diastolic parameters are consistent with Grade II diastolic dysfunction (pseudonormalization). 2. Right ventricular systolic function is normal. The right ventricular size is normal. 3. The mitral valve is normal in structure. No evidence of mitral valve regurgitation. No evidence of mitral stenosis. 4. Mild calcifications of non coroary cusp with no stenosis.. The aortic valve is normal in structure. There is mild calcification of the aortic valve. There is mild thickening of the aortic valve. Aortic valve regurgitation is not visualized. No aortic stenosis is present. 5. The inferior vena cava is normal in size with greater than 50% respiratory variability, suggesting right atrial pressure of 3 mmHg.  FINDINGS Left Ventricle: GLS -16.3. Left ventricular ejection fraction, by estimation, is 60 to 65%. The left ventricle has normal function. The left ventricle has no regional wall motion abnormalities. The left ventricular internal cavity size was normal in size. There is no left ventricular hypertrophy. Left ventricular diastolic parameters are consistent with Grade II diastolic dysfunction (pseudonormalization).  Right Ventricle: The right ventricular size is normal. No increase in right ventricular wall thickness. Right ventricular systolic function is normal.  Left Atrium: Left atrial size was normal in size.  Right Atrium: Right atrial size was  normal in size.  Pericardium: There is no evidence of pericardial effusion.  Mitral Valve: The mitral valve is normal in structure. No evidence of mitral valve regurgitation. No evidence of mitral valve stenosis.  Tricuspid Valve: The tricuspid valve is normal in structure. Tricuspid valve regurgitation is not demonstrated. No evidence of tricuspid stenosis.  Aortic Valve: Mild calcifications of non coroary cusp with no stenosis. The aortic valve is normal in structure. There is mild calcification of the aortic valve. There is mild thickening of the aortic valve. Aortic valve regurgitation is not visualized. No aortic stenosis is present. Aortic valve mean gradient measures 6.0 mmHg. Aortic valve peak gradient measures 9.7 mmHg.  Pulmonic Valve: The pulmonic valve was normal in structure. Pulmonic valve regurgitation is not visualized. No evidence of pulmonic stenosis.  Aorta: The aortic root is normal in size and structure.  Venous: The inferior vena cava is normal in size with greater than 50% respiratory variability, suggesting right atrial pressure of 3 mmHg.  IAS/Shunts: No atrial level shunt detected by color flow Doppler.   LEFT VENTRICLE PLAX 2D LVIDd:         4.80 cm Diastology LVIDs:         3.10 cm LV e'  medial:    7.18 cm/s LV PW:         1.00 cm LV E/e' medial:  18.7 LV IVS:        1.00 cm LV e' lateral:   7.18 cm/s LV E/e' lateral: 18.7   RIGHT VENTRICLE             IVC RV Basal diam:  2.40 cm     IVC diam: 2.50 cm RV S prime:     14.70 cm/s TAPSE (M-mode): 2.8 cm  LEFT ATRIUM             Index        RIGHT ATRIUM           Index LA diam:        3.50 cm 1.73 cm/m   RA Area:     10.10 cm LA Vol (A2C):   78.6 ml 38.84 ml/m  RA Volume:   20.00 ml  9.88 ml/m LA Vol (A4C):   32.6 ml 16.11 ml/m LA Biplane Vol: 51.8 ml 25.59 ml/m AORTIC VALVE AV Vmax:           156.00 cm/s AV Vmean:          116.000 cm/s AV VTI:            0.371 m AV Peak Grad:      9.7  mmHg AV Mean Grad:      6.0 mmHg LVOT Vmax:         107.00 cm/s LVOT Vmean:        74.400 cm/s LVOT VTI:          0.243 m LVOT/AV VTI ratio: 0.65  AORTA Ao Root diam: 3.30 cm Ao Asc diam:  3.10 cm  MITRAL VALVE MV Area (PHT): 3.27 cm     SHUNTS MV Decel Time: 232 msec     Systemic VTI: 0.24 m MV E velocity: 134.00 cm/s MV A velocity: 115.00 cm/s MV E/A ratio:  1.17  Ralene Burger MD Electronically signed by Ralene Burger MD Signature Date/Time: 06/19/2021/11:16:58 AM    Final      CT SCANS  CT CARDIAC SCORING (SELF PAY ONLY) 10/03/2023  Addendum 10/08/2023  4:06 PM ADDENDUM REPORT: 10/08/2023 16:04  EXAM: OVER-READ INTERPRETATION  CT CHEST  The following report is an over-read performed by radiologist Dr. Pearlean Botts [facility] on 10/08/2023. This over-read does not include interpretation of cardiac or coronary anatomy or pathology. The interpretation by the cardiologist is attached.  COMPARISON:  Low-dose lung cancer screening CT 06/09/2021  FINDINGS: Limited view of the lung parenchyma demonstrates no suspicious nodularity. Airways are normal.  Limited view of the mediastinum demonstrates no adenopathy. Esophagus normal.  Limited view of the skeleton and chest wall is unremarkable.  IMPRESSION: No significant extracardiac findings.   Electronically Signed By: Deboraha Fallow M.D. On: 10/08/2023 16:04  Narrative CLINICAL DATA:  Risk stratification: 63 Year-old Male  EXAM: Coronary Calcium  Score  TECHNIQUE: A gated, non-contrast computed tomography scan of the heart was performed using 2.5 mm slice thickness. Axial images were analyzed on a dedicated workstation. Calcium  scoring of the coronary arteries was performed using the Agatston method.  FINDINGS: Coronary Calcium  Score:  Left main: 8  Left anterior descending artery: 158  Left circumflex artery: 71  Right coronary artery: 352  Total:  589  Percentile: 85th  Ascending Aorta: Normal caliber. Aortic atherosclerosis.  Aortic Valve Calcium  score: 173  Mitral annular calcification: Mild, posterior, annular calcification.  Pericardium: Normal.  Non-cardiac: See separate report from [facility].  IMPRESSION: 1. Coronary calcium  score of 589. This was 85th percentile for age-, race-, and sex-matched controls.  RECOMMENDATIONS: Coronary artery calcium  (CAC) score is a strong predictor of incident coronary heart disease (CHD) and provides predictive information beyond traditional risk factors. CAC scoring is reasonable to use in the decision to withhold, postpone, or initiate statin therapy in intermediate-risk or selected borderline-risk asymptomatic adults (age 76-75 years and LDL-C >=70 to <190 mg/dL) who do not have diabetes or established atherosclerotic cardiovascular disease (ASCVD).* In intermediate-risk (10-year ASCVD risk >=7.5% to <20%) adults or selected borderline-risk (10-year ASCVD risk >=5% to <7.5%) adults in whom a CAC score is measured for the purpose of making a treatment decision the following recommendations have been made:  If CAC=0, it is reasonable to withhold statin therapy and reassess in 5 to 10 years, as long as higher risk conditions are absent (diabetes mellitus, family history of premature CHD in first degree relatives (males <55 years; females <65 years), cigarette smoking, or LDL >=190 mg/dL).  If CAC is 1 to 99, it is reasonable to initiate statin therapy for patients >=82 years of age.  If CAC is >=100 or >=75th percentile, it is reasonable to initiate statin therapy at any age.  Cardiology referral should be considered for patients with CAC scores >=400 or >=75th percentile.  *2018 AHA/ACC/AACVPR/AAPA/ABC/ACPM/ADA/AGS/APhA/ASPC/NLA/PCNA Guideline on the Management of Blood Cholesterol: A Report of the American College of Cardiology/American Heart Association Task  Force on Clinical Practice Guidelines. J Am Coll Cardiol. 2019;73(24):3168-3209.  Gloriann Larger, MD  Electronically Signed: By: Gloriann Larger M.D. On: 10/08/2023 12:51     ______________________________________________________________________________________________      Risk Assessment/Calculations           Physical Exam VS:  BP 110/60   Pulse 76   Ht 5' 10 (1.778 m)   Wt 180 lb (81.6 kg)   SpO2 94%   BMI 25.83 kg/m    Wt Readings from Last 3 Encounters:  10/19/23 180 lb (81.6 kg)  10/07/23 189 lb 6.4 oz (85.9 kg)  10/02/23 180 lb (81.6 kg)    GEN: Well nourished, well developed in no acute distress NECK: No JVD; No carotid bruits CARDIAC: RRR, no murmurs, rubs, gallops RESPIRATORY:  Clear to auscultation without rales, wheezing or rhonchi  ABDOMEN: Soft, non-tender, non-distended EXTREMITIES:  No edema; No deformity   ASSESSMENT AND PLAN Syncope and collapse - sounds to be most consistent with orthostasis secondary to medication use, but he also had idioventricular rhythm noted and we will arrange for a monitor for further evaluation. If this persists, he may need loop recorder. We will also change his Flomax  to at night.  Evaluated by EP during his most recent hospitalization with recommendations for repeat echocardiogram however he recently had one on 09/15/2023, so we will not repeat for now.   Idioventricular rhythm-evaluated by EP during his hospitalization, arranging monitor per above.  Dyslipidemia-followed by PCP, he is currently on Lipitor .  History of strokes-on Eliquis  per neurology, follows with Dr. Tilda Fogo in [place].  CAD-nonobstructive per left heart cath in 2020, Stable with no anginal symptoms. No indication for ischemic evaluation.         Dispo: Monitor, change Flomax  at night, follow-up with Dr. Ronell Coe in 4 weeks.  Signed, Terrance Ferretti, NP

## 2023-10-19 ENCOUNTER — Ambulatory Visit: Attending: Cardiology

## 2023-10-19 ENCOUNTER — Ambulatory Visit: Attending: Cardiology | Admitting: Cardiology

## 2023-10-19 ENCOUNTER — Encounter: Payer: Self-pay | Admitting: Cardiology

## 2023-10-19 VITALS — BP 110/60 | HR 76 | Ht 70.0 in | Wt 180.0 lb

## 2023-10-19 DIAGNOSIS — I498 Other specified cardiac arrhythmias: Secondary | ICD-10-CM

## 2023-10-19 DIAGNOSIS — I2510 Atherosclerotic heart disease of native coronary artery without angina pectoris: Secondary | ICD-10-CM | POA: Diagnosis not present

## 2023-10-19 DIAGNOSIS — I442 Atrioventricular block, complete: Secondary | ICD-10-CM

## 2023-10-19 DIAGNOSIS — R55 Syncope and collapse: Secondary | ICD-10-CM

## 2023-10-19 NOTE — Patient Instructions (Addendum)
 Medication Instructions:  Your physician recommends that you continue on your current medications as directed. Please refer to the Current Medication list given to you today.  *If you need a refill on your cardiac medications before your next appointment, please call your pharmacy*  Lab Work: None If you have labs (blood work) drawn today and your tests are completely normal, you will receive your results only by: MyChart Message (if you have MyChart) OR A paper copy in the mail If you have any lab test that is abnormal or we need to change your treatment, we will call you to review the results.  Testing/Procedures: A zio monitor was ordered today. It will remain on for 7 days. You will then return monitor and event diary in provided box. It takes 1-2 weeks for report to be downloaded and returned to us . We will call you with the results. If monitor falls off or has orange flashing light, please call Zio for further instructions.   Your physician has requested that you have an echocardiogram. Echocardiography is a painless test that uses sound waves to create images of your heart. It provides your doctor with information about the size and shape of your heart and how well your heart's chambers and valves are working. This procedure takes approximately one hour. There are no restrictions for this procedure. Please do NOT wear cologne, perfume, aftershave, or lotions (deodorant is allowed). Please arrive 15 minutes prior to your appointment time.  Please note: We ask at that you not bring children with you during ultrasound (echo/ vascular) testing. Due to room size and safety concerns, children are not allowed in the ultrasound rooms during exams. Our front office staff cannot provide observation of children in our lobby area while testing is being conducted. An adult accompanying a patient to their appointment will only be allowed in the ultrasound room at the discretion of the ultrasound  technician under special circumstances. We apologize for any inconvenience.    Follow-Up: At [facility], you and your health needs are our priority.  As part of our continuing mission to provide you with exceptional heart care, our providers are all part of one team.  This team includes your primary Cardiologist (physician) and Advanced Practice Providers or APPs (Physician Assistants and Nurse Practitioners) who all work together to provide you with the care you need, when you need it.  Your next appointment:   4 week(s)  Provider:   Pattricia Bores, NP Georgeana Kindler)    We recommend signing up for the patient portal called "MyChart".  Sign up information is provided on this After Visit Summary.  MyChart is used to connect with patients for Virtual Visits (Telemedicine).  Patients are able to view lab/test results, encounter notes, upcoming appointments, etc.  Non-urgent messages can be sent to your provider as well.   To learn more about what you can do with MyChart, go to [email or web address].   Other Instructions None

## 2023-10-21 ENCOUNTER — Ambulatory Visit: Admitting: Cardiology

## 2023-10-22 ENCOUNTER — Ambulatory Visit (INDEPENDENT_AMBULATORY_CARE_PROVIDER_SITE_OTHER)

## 2023-10-22 ENCOUNTER — Ambulatory Visit (INDEPENDENT_AMBULATORY_CARE_PROVIDER_SITE_OTHER): Admitting: Podiatry

## 2023-10-22 DIAGNOSIS — M7752 Other enthesopathy of left foot: Secondary | ICD-10-CM

## 2023-10-22 DIAGNOSIS — M79675 Pain in left toe(s): Secondary | ICD-10-CM | POA: Diagnosis not present

## 2023-10-22 DIAGNOSIS — W19XXXS Unspecified fall, sequela: Secondary | ICD-10-CM | POA: Diagnosis not present

## 2023-10-22 DIAGNOSIS — Y92009 Unspecified place in unspecified non-institutional (private) residence as the place of occurrence of the external cause: Secondary | ICD-10-CM

## 2023-10-22 DIAGNOSIS — R262 Difficulty in walking, not elsewhere classified: Secondary | ICD-10-CM

## 2023-10-22 DIAGNOSIS — B351 Tinea unguium: Secondary | ICD-10-CM

## 2023-10-22 DIAGNOSIS — M79674 Pain in right toe(s): Secondary | ICD-10-CM | POA: Diagnosis not present

## 2023-10-22 DIAGNOSIS — M25572 Pain in left ankle and joints of left foot: Secondary | ICD-10-CM

## 2023-10-22 MED ORDER — HYDROCODONE-ACETAMINOPHEN 5-325 MG PO TABS: ORAL_TABLET | ORAL | 0 refills | Status: DC

## 2023-10-22 NOTE — Progress Notes (Unsigned)
 Subjective:  Patient ID: Gabriel Gomez, male    DOB: 03/04/1961,  MRN: [id number]  Gabriel Gomez presents to clinic today for:  Chief Complaint  Patient presents with   Ankle Swelling and pain    NP here for [facility] and ankle swelling. He passed out 3 weeks ago and was put in an cam walker. He wore it for 2 weeks as told, stopped wearing it yesterday. There is still swelling and pain. I took xrays. Last A1c  8.4 , 2 weeks ago. Elliquis   Patient notes nails are thick, discolored, elongated and painful in shoegear when trying to ambulate.    He fell approximately 2 to 3 weeks ago at home in the bathroom.  He has been getting lightheaded and has fallen 7 times in the past month since his blood pressure medication was increased.  He stopped all blood pressure medications last time he went to the hospital.  After he fell, he was slightly pinned against the bathroom door, and his daughter had to push her way into the bathroom and his ankle got stuck around the door.  He is having significant pain and swelling to the left ankle.  He was in a cam walker but another provider told him to get out of it at 2 weeks post-injury.  He has too much pain and swelling to return to normal shoes yet.  He notes the tramadol  has not provided any pain relief.  States his toes were hanging off the front of the camwalker.   PCP is Melva Stabile, MD.  Past Medical History:  Diagnosis Date   Arthritis    COPD (chronic obstructive pulmonary disease) (HCC)    Coronary artery disease    Diabetes mellitus without complication (HCC)    GERD (gastroesophageal reflux disease)    Past Surgical History:  Procedure Laterality Date   CHONDROPLASTY Right 12/15/2021   Procedure: MEDIAL COMPARTMENT CHONDROPLASTY;  Surgeon: Adah Acron, MD;  Location: [facility];  Service: Orthopedics;  Laterality: Right;   JOINT REPLACEMENT Left    KNEE ARTHROSCOPY Right 12/15/2021   Procedure: RIGHT KNEE ARTHROSCOPY;   Surgeon: Adah Acron, MD;  Location: [facility];  Service: Orthopedics;  Laterality: Right;   LEFT HEART CATH AND CORONARY ANGIOGRAPHY N/A 05/30/2018   Procedure: LEFT HEART CATH AND CORONARY ANGIOGRAPHY;  Surgeon: Arnoldo Lapping, MD;  Location: [facility];  Service: Cardiovascular;  Laterality: N/A;   liver laceration  1979   Allergies  Allergen Reactions   Meloxicam  Swelling    Both legs   Morphine  Other (See Comments)    Causes severe confusion and aggression   Tape Other (See Comments)    Not an allergy, daughter wants it added because it tears his skin    Review of Systems: Negative except as noted in the HPI.  Objective:  [name] is a pleasant 63 y.o. male in NAD. AAO x 3.  Vascular Examination: Capillary refill time is 3-5 seconds to toes bilateral. Palpable pedal pulses b/l LE. Digital hair present b/l.  Skin temperature gradient WNL b/l. No varicosities b/l. No cyanosis noted b/l.   Dermatological Examination: Pedal skin with normal turgor, texture and tone b/l. No open wounds. No interdigital macerations b/l. Toenails x10 are 3mm thick, discolored, dystrophic with subungual debris. There is pain with compression of the nail plates.  They are elongated x10  Orthopedic Exam: Pain on palpation left lateral and anterior ankle joint.  Pain with ROM  of ankle and STJ.  No ecchymosis or open lesions in area of ankle.  Xray left ankle, 3 WB views: No fracture seen. Jt space narrowing at 1st MPJ.  Ankle mortise intact with minimal jt. Space narrowing.     Latest Ref Rng & Units 10/03/2023    7:17 AM  Hemoglobin A1C  Hemoglobin-A1c 4.8 - 5.6 % 8.4    Assessment/Plan: 1. Pain due to onychomycosis of toenails of both feet   2. Capsulitis of left ankle   3. Fall at home, sequela   4. Acute left ankle pain     Meds ordered this encounter  Medications   HYDROcodone -acetaminophen  (NORCO/VICODIN) 5-325 MG tablet    Sig: Take one to two tablets  every six to eight hours as needed for pain.    Dispense:  20 tablet    Refill:  0   The mycotic toenails were sharply debrided x10 with sterile nail nippers and a power debriding burr to decrease bulk/thickness and length.    Rx Vicodin 5/235mg  x 20 tablets for moderate to severe pain only, Q6-8 hours.  D/C tramadol .  Patient was fitted for pneumatic cam walker since his current cam walker he received from urgent care is too small and his toes are hanging off the front.  This is not able to allow the patient to continue weightbearing without causing more harm.  He was properly fitted today and if those were not extending beyond the device.  Return in about 3 weeks (around 11/12/2023) for recheck left ankle injury.   Joe Murders, DPM, FACFAS [facility]     [address] [place], [place] [address]                Office [phone]  Fax [phone]

## 2023-10-27 ENCOUNTER — Telehealth: Payer: Self-pay | Admitting: Podiatry

## 2023-10-27 ENCOUNTER — Other Ambulatory Visit: Payer: Self-pay | Admitting: Podiatry

## 2023-10-27 NOTE — Telephone Encounter (Signed)
 Pt requesting a refill: HYDROcodone -acetaminophen  (NORCO/VICODIN) 5-325 MG tablet

## 2023-10-28 NOTE — Telephone Encounter (Signed)
 Spoke with patient regarding pain med refills. Dr. Estle Hemp does not refill narcotic meds after a few days worth, including post op patients. He stated he would call his PCP and have them call him in pain medication.  He did state he is wearing the boot, and that the pain is at night when he takes it off.  CT

## 2023-11-03 ENCOUNTER — Other Ambulatory Visit

## 2023-11-10 ENCOUNTER — Ambulatory Visit: Admitting: Podiatry

## 2023-11-11 ENCOUNTER — Telehealth: Payer: Self-pay | Admitting: Cardiology

## 2023-11-11 NOTE — Telephone Encounter (Signed)
 Pt c/o Syncope: STAT if syncope occurred within 24 hours and pt complains of lightheadedness  Did you pass out today?  Felt faint, but didn't actually lose consciousness, per daughter. Not currently with patient  When is the last time you passed out?  This morning around 15 minutes ago patient felt faint. Didn't pass out, but became weak and fell. BP was 110/60 after the fall.  Has this occurred multiple times?  Yes    Did you have any symptoms prior to passing out?  Weakness, then knees buckled  5. Did you fall? If so, are you on a blood thinner? Yes patient fell. Yes, he's on a blood thinner.

## 2023-11-11 NOTE — Telephone Encounter (Signed)
 Spoke with daughter. She stated that the pt continues to have syncope episodes and feels its related to the blood thinner. He wants to stop this medication. Advised to not stop medication until he is seen. He has an appt on 11-17-23 with Dr. Liborio, will send info to front desk to see if his appt can be moved up. Daughter agreed and verbalized understanding.

## 2023-11-17 ENCOUNTER — Ambulatory Visit

## 2023-11-17 VITALS — BP 160/80 | HR 78 | Ht 70.0 in | Wt 175.0 lb

## 2023-11-17 DIAGNOSIS — Z794 Long term (current) use of insulin: Secondary | ICD-10-CM | POA: Insufficient documentation

## 2023-11-17 DIAGNOSIS — R55 Syncope and collapse: Secondary | ICD-10-CM

## 2023-11-17 DIAGNOSIS — I480 Paroxysmal atrial fibrillation: Secondary | ICD-10-CM

## 2023-11-17 DIAGNOSIS — F1090 Alcohol use, unspecified, uncomplicated: Secondary | ICD-10-CM

## 2023-11-17 DIAGNOSIS — Z720 Tobacco use: Secondary | ICD-10-CM | POA: Insufficient documentation

## 2023-11-17 DIAGNOSIS — R079 Chest pain, unspecified: Secondary | ICD-10-CM | POA: Insufficient documentation

## 2023-11-17 DIAGNOSIS — I442 Atrioventricular block, complete: Secondary | ICD-10-CM | POA: Diagnosis not present

## 2023-11-17 DIAGNOSIS — M879 Osteonecrosis, unspecified: Secondary | ICD-10-CM

## 2023-11-17 DIAGNOSIS — I2510 Atherosclerotic heart disease of native coronary artery without angina pectoris: Secondary | ICD-10-CM

## 2023-11-17 DIAGNOSIS — R112 Nausea with vomiting, unspecified: Secondary | ICD-10-CM | POA: Insufficient documentation

## 2023-11-17 DIAGNOSIS — R072 Precordial pain: Secondary | ICD-10-CM | POA: Diagnosis not present

## 2023-11-17 HISTORY — DX: Alcohol use, unspecified, uncomplicated: F10.90

## 2023-11-17 HISTORY — DX: Paroxysmal atrial fibrillation: I48.0

## 2023-11-17 HISTORY — DX: Nausea with vomiting, unspecified: R11.2

## 2023-11-17 HISTORY — DX: Long term (current) use of insulin: Z79.4

## 2023-11-17 HISTORY — DX: Chest pain, unspecified: R07.9

## 2023-11-17 HISTORY — DX: Osteonecrosis, unspecified: M87.9

## 2023-11-17 HISTORY — DX: Tobacco use: Z72.0

## 2023-11-17 HISTORY — DX: Atherosclerotic heart disease of native coronary artery without angina pectoris: I25.10

## 2023-11-17 MED ORDER — METOPROLOL TARTRATE 100 MG PO TABS: 100.0000 mg | ORAL_TABLET | Freq: Once | ORAL | 0 refills | Status: DC

## 2023-11-17 MED ORDER — ASPIRIN 81 MG PO TBEC: 81.0000 mg | DELAYED_RELEASE_TABLET | Freq: Every day | ORAL | Status: DC

## 2023-11-17 NOTE — Patient Instructions (Signed)
 Medication Instructions:  Your physician has recommended you make the following change in your medication:   START: Aspirin  81 mg daily  *If you need a refill on your cardiac medications before your next appointment, please call your pharmacy*  Lab Work: Your physician recommends that you return for lab work in:   Labs today: BMP  If you have labs (blood work) drawn today and your tests are completely normal, you will receive your results only by: MyChart Message (if you have MyChart) OR A paper copy in the mail If you have any lab test that is abnormal or we need to change your treatment, we will call you to review the results.  Testing/Procedures:   Your cardiac CT will be scheduled at one of the below locations:   [facility] [address] [place], [place] [address] [phone]  OR  [facility] [address] [place], [place] [address] [phone]  OR   [facility] [address] [place], [place] [address] [phone]  OR   [facility] [address] [place], [place] [address] [phone]  OR   [facility] [address]  [place], [place] [address]  If scheduled at [facility], please arrive at the [facility] and Children's Entrance (Entrance C2) of [facility] 30 minutes prior to test start time. You can use the FREE valet parking offered at entrance C (encouraged to control the heart rate for the test)  Proceed to the [facility] Radiology Department (first floor) to check-in and test prep.   All radiology patients and guests should use entrance C2 at [facility], accessed from [facility], even though the hospital's physical address listed is [address]    If scheduled at the Heart and Vascular Tower at [address], please enter the parking lot using the [address]  entrance and use the FREE valet service at the patient drop-off area. Enter the buidling and check-in with registration on the main floor.  If scheduled at [facility] or [facility], please arrive 15 mins early for check-in and test prep.  There is spacious parking and easy access to the radiology department from the [facility] Heart and Vascular entrance. Please enter here and check-in with the desk attendant.   [facility], please arrive 30 minutes early for check-in and test prep.  Please follow these instructions carefully (unless otherwise directed):  An IV will be required for this test and Nitroglycerin  will be given.  Hold all erectile dysfunction medications at least 3 days (72 hrs) prior to test. (Ie viagra, cialis, sildenafil, tadalafil, etc)   On the Night Before the Test: Be sure to Drink plenty of water. Do not consume any caffeinated/decaffeinated beverages or chocolate 12 hours prior to your test. Do not take any antihistamines 12 hours prior to your test.  On the Day of the Test: Drink plenty of water until 1 hour prior to the test. Do not eat any food 1 hour prior to test. You may take your regular medications prior to the test.  Take metoprolol  (Lopressor ) two hours prior to test. Patients who wear a continuous glucose monitor MUST remove the device prior to scanning.      After the Test: Drink plenty of water. After receiving IV contrast, you may experience a mild flushed feeling. This is normal. On  occasion, you may experience a mild rash up to 24 hours after the test. This is not dangerous. If this occurs, you can take Benadryl 25 mg, Zyrtec, Claritin, or Allegra and increase your fluid intake. (Patients taking Tikosyn should avoid Benadryl, and may take Zyrtec, Claritin, or Allegra) If you experience trouble breathing, this can be serious. If it is severe call 911 IMMEDIATELY. If it is mild, please  call our office.  We will call to schedule your test 2-4 weeks out understanding that some insurance companies will need an authorization prior to the service being performed.   For more information and frequently asked questions, please visit our website : [email or web address]  For non-scheduling related questions, please contact the cardiac imaging nurse navigator should you have any questions/concerns: Cardiac Imaging Nurse Navigators Direct Office Dial: [phone]   For scheduling needs, including cancellations and rescheduling, please call [place], [phone].   Follow-Up: At [facility], you and your health needs are our priority.  As part of our continuing mission to provide you with exceptional heart care, our providers are all part of one team.  This team includes your primary Cardiologist (physician) and Advanced Practice Providers or APPs (Physician Assistants and Nurse Practitioners) who all work together to provide you with the care you need, when you need it.  Your next appointment:   2 month(s)  Provider:   Alean Kobus, MD    We recommend signing up for the patient portal called MyChart.  Sign up information is provided on this After Visit Summary.  MyChart is used to connect with patients for Virtual Visits (Telemedicine).  Patients are able to view lab/test results, encounter notes, upcoming appointments, etc.  Non-urgent messages can be sent to your provider as well.   To learn more about what you can do with MyChart, go to [email or web address].   Other Instructions None

## 2023-11-17 NOTE — Assessment & Plan Note (Signed)
 Discussion as under chest pain.

## 2023-11-17 NOTE — Assessment & Plan Note (Signed)
 Was an incidental finding during telemetry inpatient. No evidence of idioventricular rhythm on recent Zio patch.

## 2023-11-17 NOTE — Assessment & Plan Note (Signed)
 Recurrent syncope and falls multiple times. Inpatient his symptoms appear to have been somewhat correlated with orthostatic blood pressure changes. However he does have recurrent episodes multiple without memory of the event. Cannot exclude arrhythmias.  During inpatient stay and no syncopal episode to document but had orthostatic blood pressure changes.  Not entirely exclude arrhythmias.  Will refer to EP to consider loop recorder placement given his history of paroxysmal atrial fibrillation, recurrent syncopal episodes.

## 2023-11-17 NOTE — Assessment & Plan Note (Signed)
 Recurrent chest pain episodes requiring to take sublingual nitroglycerin . History of mild nonobstructive coronary artery disease on cardiac cath in January 2020. Calcium  score was repeated inpatient, unclear why this was done, but noted calcium  score elevated 589 in May 2025.  With his ongoing episodes of chest pain requiring nitroglycerin , would recommend further evaluation for any obstructive coronary artery disease with cardiac CT imaging.  Will schedule for this.  He is agreeable.  Advised him to start taking aspirin  81 mg once daily. Continue atorvastatin  80 mg once daily.

## 2023-11-17 NOTE — Assessment & Plan Note (Signed)
 Unclear when this diagnosis was made but he mentions during his recent EGD at [facility] [place]  and he was told about A-fib episode periprocedurally.  Currently in sinus rhythm.  Proceed with loop recorder to assess A-fib burden. Was previously on Eliquis .  This was self discontinued about couple weeks ago due to recurrent falls and concerns for bleeding and injury.

## 2023-11-17 NOTE — Progress Notes (Signed)
 Cardiology Consultation:    Date:  11/17/2023   ID:  Gabriel Gomez, DOB Jul 03, 1960, MRN [id number]  PCP:  Silver Lamar LABOR, MD  Cardiologist:  Alean SAUNDERS Marwin Primmer, MD   Referring MD: Silver Lamar LABOR, MD   Chief Complaint  Patient presents with   Follow-up     ASSESSMENT AND PLAN:   Gabriel Gomez 63 year old male history of mild nonobstructive coronary artery disease on cardiac cath January 2020 and recent calcium  score 589 May 2025, peripheral arterial disease with moderate to severe normal left ABI, diabetes mellitus, dyslipidemia, hypertension, CVA a year and a half ago, paroxysmal atrial fibrillation [diagnosed per him during a recent EGD procedure] previously on Eliquis  now recently over the past 2 to 3 weeks he self discontinued given the recurrent falls, recently had a syncopal episode attributed to orthostatic hypotension after workup May 2025 at [facility] and noted with asymptomatic accelerated idioventricular rhythm while on telemetry the hospital.  Echocardiogram 09/15/2023 from [facility] [place]  reported LVEF 55 to 60% with mild concentric LVH mildly dilated left atrium no significant valve abnormalities [mild mitral annular calcification and mild aortic sclerosis observed].  Complex visit with multiple medical issues  Problem List Items Addressed This Visit     Syncope and collapse   Recurrent syncope and falls multiple times. Inpatient his symptoms appear to have been somewhat correlated with orthostatic blood pressure changes. However he does have recurrent episodes multiple without memory of the event. Cannot exclude arrhythmias.  During inpatient stay and no syncopal episode to document but had orthostatic blood pressure changes.  Not entirely exclude arrhythmias.  Will refer to EP to consider loop recorder placement given his history of paroxysmal atrial fibrillation, recurrent syncopal episodes.       Accelerated  idioventricular rhythm (HCC)   Was an incidental finding during telemetry inpatient. No evidence of idioventricular rhythm on recent Zio patch.       Relevant Medications   aspirin  EC 81 MG tablet   metoprolol  tartrate (LOPRESSOR ) 100 MG tablet   CAD in native artery   Discussion as under chest pain.       Relevant Medications   aspirin  EC 81 MG tablet   metoprolol  tartrate (LOPRESSOR ) 100 MG tablet   Paroxysmal atrial fibrillation (HCC)   Unclear when this diagnosis was made but he mentions during his recent EGD at [facility] Gabriel Gomez  and he was told about A-fib episode periprocedurally.  Currently in sinus rhythm.  Proceed with loop recorder to assess A-fib burden. Was previously on Eliquis .  This was self discontinued about couple weeks ago due to recurrent falls and concerns for bleeding and injury.        Relevant Medications   aspirin  EC 81 MG tablet   metoprolol  tartrate (LOPRESSOR ) 100 MG tablet   Chest pain of uncertain etiology - Primary   Recurrent chest pain episodes requiring to take sublingual nitroglycerin . History of mild nonobstructive coronary artery disease on cardiac cath in January 2020. Calcium  score was repeated inpatient, unclear why this was done, but noted calcium  score elevated 589 in May 2025.  With his ongoing episodes of chest pain requiring nitroglycerin , would recommend further evaluation for any obstructive coronary artery disease with cardiac CT imaging.  Will schedule for this.  He is agreeable.  Advised him to start taking aspirin  81 mg once daily. Continue atorvastatin  80 mg once daily.       Relevant Orders   EKG 12-Lead (Completed)  Ambulatory referral to Cardiac Electrophysiology   Basic Metabolic Panel (BMET)   Other Visit Diagnoses       Precordial pain       Relevant Orders   Ambulatory referral to Cardiac Electrophysiology   CT CORONARY MORPH W/CTA COR W/SCORE W/CA W/CM &/OR WO/CM   Basic  Metabolic Panel (BMET)      Return to clinic tentatively in 2 months.   History of Present Illness:    Gabriel Gomez is a 63 y.o. male who is being seen today for follow-up visit. PCP is Silver Lamar LABOR, MD. Last visit with us  in the office was 10/19/2023 with Delon Hoover, NP-C. Here for the visit today accompanied by his ex-wife.  Spends most of his time at home with his son or with his ex-wife.  As history of mild nonobstructive coronary artery disease on cardiac cath January 2020 and recent calcium  score 589 May 2025, peripheral arterial disease with moderate to severe normal left ABI, diabetes mellitus, dyslipidemia, hypertension, CVA a year and a half ago, paroxysmal atrial fibrillation [diagnosed per him during a recent EGD procedure] previously on Eliquis  now recently over the past 2 to 3 weeks he self discontinued given the recurrent falls, recently had a syncopal episode attributed to orthostatic hypotension after workup May 2025 at [facility] and noted with asymptomatic accelerated idioventricular rhythm while on telemetry the hospital.  Echocardiogram 09/15/2023 from [facility] [place] Gabriel Gomez  reported LVEF 55 to 60% with mild concentric LVH mildly dilated left atrium no significant valve abnormalities [mild mitral annular calcification and mild aortic sclerosis observed].  After his last follow-up visit he had a Zio patch placed 10-19-2023 for a 3-day study as he mentions the patch kept falling off, noted normal sinus rhythm predominantly average heart rate 78/min [ranging from 59 to 124 bpm], no significant pauses or high-grade conduction abnormalities.  Rare ventricular and supraventricular ectopy burden less than 1%.  Reassuring results.  Reviewed the tracings myself.  Here for the visit today accompanied by his wife's wife. Mentions he has had multiple episodes of syncope. Reports at least 2 episodes of syncope in the past month after discharge  from [facility]. Mentions this occurred suddenly he does not have much memory about the events.  These have not been witnessed.  Is a very difficult historian due to poor memory and recollection of the events. Mentions blood pressures have been running low and most of his medications that affect blood pressures have been discontinued. For fear of significant falls and bleeding Eliquis  also was discontinued about a week ago.  Also reports having chest pain symptoms with minimal exertion. Has taken about 10 different times of sublingual nitroglycerin  to ease up the pain over the past month.  Denies any pedal edema. Denies any blood in urine or stools.  Continues to smoke cigarettes.  Mentions he is aware and is aware to quit 10 mentions he will work on it.  EKG in the clinic today shows sinus rhythm heart rate 70/min, PR interval 168 ms, QRS durations 96 ms, QTc 429 ms.  No ischemic changes.  Left axis deviation QRS noted.  Past Medical History:  Diagnosis Date   Arthritis    COPD (chronic obstructive pulmonary disease) (HCC)    Coronary artery disease    Diabetes mellitus without complication (HCC)    GERD (gastroesophageal reflux disease)     Past Surgical History:  Procedure Laterality Date   CHONDROPLASTY Right 12/15/2021   Procedure: MEDIAL COMPARTMENT  CHONDROPLASTY;  Surgeon: Barbarann Oneil BROCKS, MD;  Location: [facility];  Service: Orthopedics;  Laterality: Right;   JOINT REPLACEMENT Left    KNEE ARTHROSCOPY Right 12/15/2021   Procedure: RIGHT KNEE ARTHROSCOPY;  Surgeon: Barbarann Oneil BROCKS, MD;  Location: [facility];  Service: Orthopedics;  Laterality: Right;   LEFT HEART CATH AND CORONARY ANGIOGRAPHY N/A 05/30/2018   Procedure: LEFT HEART CATH AND CORONARY ANGIOGRAPHY;  Surgeon: Wonda Sharper, MD;  Location: [facility] CV [facility];  Service: Cardiovascular;  Laterality: N/A;   liver laceration  1979    Current Medications: Current Meds  Medication  Sig   ACCU-CHEK GUIDE test strip USE ONE strip FOUR TIMES DAILY   Accu-Chek Softclix Lancets lancets Use as instructed   albuterol  (VENTOLIN  HFA) 108 (90 Base) MCG/ACT inhaler Inhale 2 puffs into the lungs every 6 (six) hours as needed for wheezing or shortness of breath.   aspirin  EC 81 MG tablet Take 1 tablet (81 mg total) by mouth daily. Swallow whole.   atorvastatin  (LIPITOR ) 80 MG tablet TAKE ONE TABLET BY MOUTH ONCE DAILY AT 6pm   [Paused] bethanechol (URECHOLINE) 50 MG tablet Take 50 mg by mouth 2 (two) times daily.   Blood Glucose Monitoring Suppl (ACCU-CHEK GUIDE) w/Device KIT To use to check glucose qd   Cetirizine-Pseudoephedrine (ZYRTEC-D ALLERGY & SINUS PO) Take 1 tablet by mouth daily.   DULoxetine  (CYMBALTA ) 60 MG capsule Take 60 mg by mouth daily.   LANTUS  SOLOSTAR 100 UNIT/ML Solostar Pen Inject 30 Units into the skin at bedtime.   metoprolol  tartrate (LOPRESSOR ) 100 MG tablet Take 1 tablet (100 mg total) by mouth once for 1 dose. Please take this medication 2 hours before CT.   nitroGLYCERIN  (NITROSTAT ) 0.4 MG SL tablet Place 1 tablet (0.4 mg total) under the tongue every 5 (five) minutes x 3 doses as needed for chest pain.   NOVOLOG  FLEXPEN 100 UNIT/ML FlexPen inject 15 units EVERY MORNING, 10 units AT LUNCH AND 10 units EVERY EVENING   pantoprazole  (PROTONIX ) 40 MG tablet Take 40 mg by mouth 2 (two) times daily.   pregabalin  (LYRICA ) 100 MG capsule Take 100 mg by mouth 2 (two) times daily.   SURE COMFORT PEN NEEDLES 32G X 4 MM MISC AS DIRECTED FOUR TIMES DAILY   traMADol  (ULTRAM ) 50 MG tablet Take 50 mg by mouth every 6 (six) hours as needed for moderate pain (pain score 4-6).     Allergies:   Meloxicam , Morphine , and Tape   Social History   Socioeconomic History   Marital status: Married    Spouse name: Not on file   Number of children: Not on file   Years of education: Not on file   Highest education level: Not on file  Occupational History   Not on file  Tobacco  Use   Smoking status: Every Day    Current packs/day: 1.00    Types: Cigarettes   Smokeless tobacco: Never   Tobacco comments:    10/07/2023 Patient smokes close to a pack daily depending on the day  Substance and Sexual Activity   Alcohol use: Yes    Comment: social beer   Drug use: Never   Sexual activity: Yes    Partners: Female  Other Topics Concern   Not on file  Social History Narrative   Not on file   Social Drivers of Health   Financial Resource Strain: Not on file  Food Insecurity: No Food Insecurity (10/03/2023)   Hunger Vital  Sign    Worried About Programme researcher, broadcasting/film/video in the Last Year: Never true    Ran Out of Food in the Last Year: Never true  Transportation Needs: No Transportation Needs (10/03/2023)   PRAPARE - Administrator, Civil Service (Medical): No    Lack of Transportation (Non-Medical): No  Physical Activity: Not on file  Stress: Not on file  Social Connections: Moderately Isolated (10/03/2023)   Social Connection and Isolation Panel    Frequency of Communication with Friends and Family: More than three times a week    Frequency of Social Gatherings with Friends and Family: Once a week    Attends Religious Services: Never    Database administrator or Organizations: No    Attends Banker Meetings: 1 to 4 times per year    Marital Status: Widowed     Family History: The patient's family history includes CAD in his father. ROS:   Please see the history of present illness.    All 14 point review of systems negative except as described per history of present illness.  EKGs/Labs/Other Studies Reviewed:    The following studies were reviewed today:   EKG:  EKG Interpretation Date/Time:  Wednesday November 17 2023 15:16:38 EDT Ventricular Rate:  70 PR Interval:  168 QRS Duration:  96 QT Interval:  398 QTC Calculation: 429 R Axis:   -43  Text Interpretation: Normal sinus rhythm Left axis deviation Septal infarct (cited on or  before 03-Oct-2023) When compared with ECG of 03-Oct-2023 10:07, Questionable change in initial forces of Anterior leads Confirmed by Liborio Hai reddy [phone]) on 11/17/2023 3:35:10 PM    Recent Labs: 10/02/2023: ALT 19 10/03/2023: BUN 10; Creatinine, Ser 1.05; Hemoglobin 11.8; Magnesium  1.4; Platelets 310; Potassium 4.0; Sodium 134; TSH 2.189  Recent Lipid Panel    Component Value Date/Time   CHOL 154 12/10/2021 0852   TRIG 82 12/10/2021 0852   HDL 74 12/10/2021 0852   CHOLHDL 2.1 12/10/2021 0852   CHOLHDL 4.9 05/29/2018 0351   VLDL 29 05/29/2018 0351   LDLCALC 65 12/10/2021 0852    Physical Exam:    VS:  BP (!) 160/80   Pulse 78   Ht 5' 10 (1.778 m)   Wt 175 lb (79.4 kg)   SpO2 96%   BMI 25.11 kg/m     Wt Readings from Last 3 Encounters:  11/17/23 175 lb (79.4 kg)  10/19/23 180 lb (81.6 kg)  10/07/23 189 lb 6.4 oz (85.9 kg)     GENERAL:  Well nourished, well developed in no acute distress NECK: No JVD; No carotid bruits CARDIAC: RRR, S1 and S2 present, no murmurs, no rubs, no gallops CHEST:  Clear to auscultation without rales, wheezing or rhonchi  Extremities: No pitting pedal edema. Pulses bilaterally symmetric with radial 2+ and dorsalis pedis 2+ NEUROLOGIC:  Alert and oriented x 3  Medication Adjustments/Labs and Tests Ordered: Current medicines are reviewed at length with the patient today.  Concerns regarding medicines are outlined above.  Orders Placed This Encounter  Procedures   CT CORONARY MORPH W/CTA COR W/SCORE W/CA W/CM &/OR WO/CM   Basic Metabolic Panel (BMET)   Ambulatory referral to Cardiac Electrophysiology   EKG 12-Lead   Meds ordered this encounter  Medications   aspirin  EC 81 MG tablet    Sig: Take 1 tablet (81 mg total) by mouth daily. Swallow whole.   metoprolol  tartrate (LOPRESSOR ) 100 MG tablet    Sig: Take 1  tablet (100 mg total) by mouth once for 1 dose. Please take this medication 2 hours before CT.    Dispense:  1 tablet     Refill:  0    Signed, Jeret Goyer reddy Yehudah Standing, MD, MPH, [facility]. 11/17/2023 4:16 PM    [facility]

## 2023-11-18 ENCOUNTER — Ambulatory Visit: Payer: Self-pay

## 2023-11-18 LAB — BASIC METABOLIC PANEL WITH GFR
BUN/Creatinine Ratio: 11 (ref 10–24)
BUN: 10 mg/dL (ref 8–27)
CO2: 21 mmol/L (ref 20–29)
Calcium: 9.2 mg/dL (ref 8.6–10.2)
Chloride: 97 mmol/L (ref 96–106)
Creatinine, Ser: 0.94 mg/dL (ref 0.76–1.27)
Glucose: 136 mg/dL — ABNORMAL HIGH (ref 70–99)
Potassium: 4.7 mmol/L (ref 3.5–5.2)
Sodium: 133 mmol/L — ABNORMAL LOW (ref 134–144)
eGFR: 90 mL/min/1.73 (ref >59)

## 2023-11-25 DIAGNOSIS — I951 Orthostatic hypotension: Secondary | ICD-10-CM | POA: Insufficient documentation

## 2023-11-30 ENCOUNTER — Encounter (HOSPITAL_COMMUNITY): Payer: Self-pay

## 2023-12-02 ENCOUNTER — Ambulatory Visit (HOSPITAL_BASED_OUTPATIENT_CLINIC_OR_DEPARTMENT_OTHER)
Admission: RE | Admit: 2023-12-02 | Discharge: 2023-12-02 | Disposition: A | Discharge status: Home / Self Care | Source: Ambulatory Visit | Admitting: Radiology

## 2023-12-02 DIAGNOSIS — R072 Precordial pain: Secondary | ICD-10-CM | POA: Diagnosis not present

## 2023-12-02 MED ORDER — IOHEXOL 350 MG/ML SOLN
95.0000 mL | Freq: Once | INTRAVENOUS | Status: AC | PRN
  Administered 2023-12-02: 95 mL via INTRAVENOUS

## 2023-12-02 MED ORDER — METOPROLOL TARTRATE 5 MG/5ML IV SOLN: 10.0000 mg | Freq: Once | INTRAVENOUS | Status: DC | PRN

## 2023-12-02 MED ORDER — DILTIAZEM HCL 25 MG/5ML IV SOLN: 10.0000 mg | INTRAVENOUS | Status: DC | PRN

## 2023-12-02 MED ORDER — NITROGLYCERIN 0.4 MG SL SUBL
0.8000 mg | SUBLINGUAL_TABLET | Freq: Once | SUBLINGUAL | Status: AC
  Administered 2023-12-02: 0.8 mg via SUBLINGUAL

## 2023-12-03 ENCOUNTER — Ambulatory Visit (HOSPITAL_BASED_OUTPATIENT_CLINIC_OR_DEPARTMENT_OTHER)
Admission: RE | Admit: 2023-12-03 | Discharge: 2023-12-03 | Disposition: A | Discharge status: Home / Self Care | Source: Ambulatory Visit | Attending: Cardiology | Admitting: Cardiology

## 2023-12-03 ENCOUNTER — Other Ambulatory Visit: Payer: Self-pay | Admitting: Cardiology

## 2023-12-03 DIAGNOSIS — R931 Abnormal findings on diagnostic imaging of heart and coronary circulation: Secondary | ICD-10-CM

## 2023-12-03 NOTE — Progress Notes (Signed)
 FFR Order

## 2023-12-09 ENCOUNTER — Ambulatory Visit: Payer: Self-pay | Admitting: Cardiology

## 2023-12-20 ENCOUNTER — Telehealth: Payer: Self-pay

## 2023-12-20 NOTE — Telephone Encounter (Signed)
CT AngioResults reviewed with pt as per Dr. Krasowski's note.  Pt verbalized understanding and had no additional questions. Routed to PCP  

## 2024-01-02 NOTE — Progress Notes (Unsigned)
 Cardiology Office Note:    Date:  01/07/2024   ID:  Gabriel Gomez, DOB [birth year], MRN [id number]  PCP:  Silver Gabriel LABOR, Gomez  Cardiologist:  Gabriel Leiter, Gomez    Referring Gomez: Gabriel Gabriel PARAS, Gomez    ASSESSMENT:    1. CAD in native artery   2. Agatston coronary artery calcium  score greater than 400   3. Syncope and collapse   4. Orthostatic hypotension   5. Paroxysmal atrial fibrillation (HCC)   6. Chronic anticoagulation   7. Hypertensive heart disease without heart failure   8. Left ventricular hypertrophy   9. Mixed hyperlipidemia   10. Type 2 diabetes mellitus with complication, with long-term current use of insulin  (HCC)   11. PAD (peripheral artery disease) (HCC)    PLAN:    In order of problems listed above:  He has mild nonobstructive CAD stable and on good medical therapy including aspirin  statin at this time does not require any antianginal nature agents or beta-blocker No recurrence of his orthostatic hypotension I would not put him on antihypertensive agents at this time Clinical recurrence but high risk of recurrent stroke I would favor referral to Watchman device the patient and his wife will consider No longer anticoagulated if endoscopy is clear I will place him back on his anticoagulant with PPI stop aspirin  and refer for watchman evaluation Stable hypertension I have asked his wife to check blood pressure daily an hour after he is medications and leave me a list in 2 weeks. Continue his current lipid-lowering therapy Stable diabetes and PAD   Next appointment: 6 months   Medication Adjustments/Labs and Tests Ordered: Current medicines are reviewed at length with the patient today.  Concerns regarding medicines are outlined above.  No orders of the defined types were placed in this encounter.  No orders of the defined types were placed in this encounter.    History of Present Illness:    Gabriel Gomez is a 63 y.o. male with a hx of  nonobstructive CAD peripheral arterial disease type 2 diabetes insulin -dependent hypertension dyslipidemia ventricular arrhythmia with accelerated idioventricular arrhythmia stroke and paroxysmal atrial fibrillation and left Pontine stroke with previous but not current chronic anticoagulation last seen 0 11/17/2023.  Initial interaction in May was for syncope and he was seen by EP as an inpatient and had orthostatic hypotension and felt that his ventricular arrhythmia was not on specific.  An event monitor performed that showed sinus rhythm throughout with first-degree AV block both ventricular and supraventricular ectopy rare and there was no bradycardia.  Had a cardiac CTA performed 12/02/2022 showing an elevated calcium  score 574 84th percentile.  He had multivessel CAD less than 50% multiple segments of the left anterior descending coronary artery and diagonal branch left circumflex artery also less than 50% involving the first marginal branch right coronary artery described as large dominant artery and showed a 50 to 69% stenosis of the posterior descending artery.  Subsequent FFR showed no evidence of flow limitation.  He also had an echocardiogram performed at [facility] 09/15/2023 showing mild LVH normal ejection fraction.  Gabriel Gomez February showed cholesterol 154 LDL 67 non-HDL cholesterol 81.  Other labs [facility] May hemoglobin 11.3 with normal MCV and platelet count 316,000 A1c 8.3% 11/11/2023 he had significant hyponatremia with a sodium of 127 creatinine 1.4 GFR 56 cc/min  Compliance with diet, lifestyle and medications: Yes  Is a very difficult multifaceted complex visit Fortunately his wife is present She tells me he is  pending endoscopy Gabriel Gomez in follow-up to his previous GI bleed He has a history of multiple strokes and his anticoagulant has been discontinued The difficult decision of what to do with him if he has healing of his esophagus he could leave her back on  anticoagulant long-term or refer him for a Watchman device I think he is best served by the latter and he and his wife will consider He is complaining bitterly of chronic pain in the left arm since his stroke it is nonanginal in nature I asked the wife to discuss this with his primary care physician Fortunately he is not having cardiovascular symptoms of palpitation edema chest pain or shortness of breath. They understand that he needs to stop aspirin  prior to endoscopy Past Medical History:  Diagnosis Date   Abnormal laboratory test 11/15/2019   Accelerated idioventricular rhythm (HCC) 10/02/2023   Acute laryngopharyngitis 05/21/2020   Acute left-sided low back pain with left-sided sciatica 11/06/2019   Acute pain of right shoulder 09/25/2022   Alcohol use 11/17/2023   Arthritis    Asymmetric SNHL (sensorineural hearing loss) 02/25/2022   Asymptomatic microscopic hematuria 11/06/2019   Atherosclerosis of coronary artery 11/17/2023   Balance problem 07/06/2023   Body mass index (BMI) 27.0-27.9, adult 08/02/2020   Bulging lumbar disc 03/07/2020   Chest pain of uncertain etiology 11/17/2023   Chronic health problem 10/02/2023   Chronic pain 01/07/2023   COPD (chronic obstructive pulmonary disease) (HCC)    Coronary artery disease    Diabetes mellitus without complication (HCC)    Dysfunction of both eustachian tubes 04/23/2022   Dysuria 04/27/2022   Essential hypertension, benign 08/03/2019   Gait disturbance 07/06/2023   GERD (gastroesophageal reflux disease)    Hypertension associated with diabetes (HCC) 10/02/2023   Hypokalemia 10/02/2023   Hypomagnesemia 06/29/2023   Hyponatremia 05/29/2018   Left ankle sprain 10/02/2023   Left pontine CVA (HCC) 04/23/2022   Chronic finding     Leukocytosis 05/29/2018   Long term current use of insulin  (HCC) 11/17/2023   Low back pain with sciatica 06/10/2020   Lumbar radiculopathy 03/07/2020   Mixed dyslipidemia 04/21/2022   Mixed  hyperlipidemia 08/03/2019   Nausea and vomiting 11/17/2023   Need for tetanus, diphtheria, and acellular pertussis (Tdap) vaccine 11/06/2019   Orthostatic syncope 11/25/2023   Osteonecrosis, unspecified (HCC) 11/17/2023   PAD (peripheral artery disease) (HCC) 10/01/2022   Paroxysmal atrial fibrillation (HCC) 11/17/2023   Peripheral polyneuropathy 07/01/2020   Polyneuropathy due to type 2 diabetes mellitus (HCC) 04/21/2022   Prostate cancer screening 11/15/2019   Respiratory infection 03/11/2022   Rib pain on right side 05/11/2022   Right flank pain 04/21/2022   Right knee meniscal tear 10/21/2021   Right leg weakness 07/06/2023   Syncope and collapse 10/02/2023   Tobacco user 11/17/2023   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/02/2023   Type 2 diabetes mellitus without complication, with long-term current use of insulin  (HCC) 08/03/2019   Unstable angina (HCC) 05/28/2018   Urinary urgency 04/27/2022   Vitamin D  deficiency 06/29/2023    Current Medications: Current Meds  Medication Sig   ACCU-CHEK GUIDE test strip USE ONE strip FOUR TIMES DAILY   Accu-Chek Softclix Lancets lancets Use as instructed   albuterol  (VENTOLIN  HFA) 108 (90 Base) MCG/ACT inhaler Inhale 2 puffs into the lungs every 6 (six) hours as needed for wheezing or shortness of breath.   alfuzosin (UROXATRAL) 10 MG 24 hr tablet Take 10 mg by mouth daily  with breakfast.   aspirin  EC 81 MG tablet Take 1 tablet (81 mg total) by mouth daily. Swallow whole.   atorvastatin  (LIPITOR ) 80 MG tablet TAKE ONE TABLET BY MOUTH ONCE DAILY AT 6pm   [Paused] bethanechol (URECHOLINE) 50 MG tablet Take 50 mg by mouth 2 (two) times daily.   Blood Glucose Monitoring Suppl (ACCU-CHEK GUIDE) w/Device KIT To use to check glucose qd   Cetirizine-Pseudoephedrine (ZYRTEC-D ALLERGY & SINUS PO) Take 1 tablet by mouth daily.   DULoxetine  (CYMBALTA ) 60 MG capsule Take 60 mg by mouth daily.   hydrOXYzine (VISTARIL) 25 MG  capsule Take 25 mg by mouth every 8 (eight) hours as needed for anxiety.   LANTUS  SOLOSTAR 100 UNIT/ML Solostar Pen Inject 30 Units into the skin at bedtime.   nitroGLYCERIN  (NITROSTAT ) 0.4 MG SL tablet Place 1 tablet (0.4 mg total) under the tongue every 5 (five) minutes x 3 doses as needed for chest pain.   NOVOLOG  FLEXPEN 100 UNIT/ML FlexPen inject 15 units EVERY MORNING, 10 units AT LUNCH AND 10 units EVERY EVENING   pantoprazole  (PROTONIX ) 40 MG tablet Take 40 mg by mouth 2 (two) times daily.   pregabalin  (LYRICA ) 100 MG capsule Take 100 mg by mouth 2 (two) times daily.   SURE COMFORT PEN NEEDLES 32G X 4 MM MISC AS DIRECTED FOUR TIMES DAILY      EKGs/Labs/Other Studies Reviewed:    The following studies were reviewed today:  Cardiac Studies & Procedures   ______________________________________________________________________________________________ CARDIAC CATHETERIZATION  CARDIAC CATHETERIZATION 05/30/2018  Conclusion  Prox RCA lesion is 30% stenosed.  Mid RCA lesion is 30% stenosed.  Dist RCA lesion is 40% stenosed.  Dist LM to Ost LAD lesion is 40% stenosed.  Prox LAD lesion is 40% stenosed.  Ost 1st Diag lesion is 30% stenosed.  Prox Cx to Mid Cx lesion is 40% stenosed.  Post Atrio lesion is 100% stenosed.  1. Mild diffuse nonobstructive CAD as outlined above (no lesions greater than 50% in any of the major epicardial vessels) 2. Normal LV function by echo and normal LVEDP 3. There is a distal RCA branch vessel occlusion involving a tiny PLA vessel < 1 mm in caliber  Suspect noncardiac CP. Recommend medical therapy, tobacco cessation for risk reduction.  Findings Coronary Findings Diagnostic  Dominance: Right  Left Main Dist LM to Ost LAD lesion is 40% stenosed.  Left Anterior Descending Prox LAD lesion is 40% stenosed.  First Diagonal Branch Ost 1st Diag lesion is 30% stenosed.  Left Circumflex Prox Cx to Mid Cx lesion is 40% stenosed.  Right  Coronary Artery Prox RCA lesion is 30% stenosed. Mid RCA lesion is 30% stenosed. Dist RCA lesion is 40% stenosed.  Right Posterior Atrioventricular Artery Post Atrio lesion is 100% stenosed. distal occlusion, tiny vessel  Intervention  No interventions have been documented.     ECHOCARDIOGRAM  ECHOCARDIOGRAM COMPLETE 06/19/2021  Narrative ECHOCARDIOGRAM REPORT    Patient Name:   Gabriel Gomez Date of Exam: 06/19/2021 Medical Rec #:  [id number]       Height:       70.0 in Accession #:    [id number]      Weight:       186.0 lb Date of Birth:  05/25/1960       BSA:          2.024 m Patient Age:    61 years        BP:  146/76 mmHg Patient Gender: M               HR:           76 bpm. Exam Location:  Gabriel [facility]  Procedure: 2D Echo, Cardiac Doppler, Color Doppler and Strain Analysis  Indications:    Aortic valve calcification [I35.9 (ICD-10-CM)]  History:        Patient has prior history of Echocardiogram examinations, most recent 05/30/2018. Aortic Valve Disease; Risk Factors:Hypertension and Dyslipidemia.  Sonographer:    Gabriel Gomez RDCS Referring Phys: [phone] SARA DAVIS  IMPRESSIONS   1. GLS -16.3. Left ventricular ejection fraction, by estimation, is 60 to 65%. The left ventricle has normal function. The left ventricle has no regional wall motion abnormalities. Left ventricular diastolic parameters are consistent with Grade II diastolic dysfunction (pseudonormalization). 2. Right ventricular systolic function is normal. The right ventricular size is normal. 3. The mitral valve is normal in structure. No evidence of mitral valve regurgitation. No evidence of mitral stenosis. 4. Mild calcifications of non coroary cusp with no stenosis.. The aortic valve is normal in structure. There is mild calcification of the aortic valve. There is mild thickening of the aortic valve. Aortic valve regurgitation is not visualized. No aortic stenosis is present. 5. The inferior  vena cava is normal in size with greater than 50% respiratory variability, suggesting right atrial pressure of 3 mmHg.  FINDINGS Left Ventricle: GLS -16.3. Left ventricular ejection fraction, by estimation, is 60 to 65%. The left ventricle has normal function. The left ventricle has no regional wall motion abnormalities. The left ventricular internal cavity size was normal in size. There is no left ventricular hypertrophy. Left ventricular diastolic parameters are consistent with Grade II diastolic dysfunction (pseudonormalization).  Right Ventricle: The right ventricular size is normal. No increase in right ventricular wall thickness. Right ventricular systolic function is normal.  Left Atrium: Left atrial size was normal in size.  Right Atrium: Right atrial size was normal in size.  Pericardium: There is no evidence of pericardial effusion.  Mitral Valve: The mitral valve is normal in structure. No evidence of mitral valve regurgitation. No evidence of mitral valve stenosis.  Tricuspid Valve: The tricuspid valve is normal in structure. Tricuspid valve regurgitation is not demonstrated. No evidence of tricuspid stenosis.  Aortic Valve: Mild calcifications of non coroary cusp with no stenosis. The aortic valve is normal in structure. There is mild calcification of the aortic valve. There is mild thickening of the aortic valve. Aortic valve regurgitation is not visualized. No aortic stenosis is present. Aortic valve mean gradient measures 6.0 mmHg. Aortic valve peak gradient measures 9.7 mmHg.  Pulmonic Valve: The pulmonic valve was normal in structure. Pulmonic valve regurgitation is not visualized. No evidence of pulmonic stenosis.  Aorta: The aortic root is normal in size and structure.  Venous: The inferior vena cava is normal in size with greater than 50% respiratory variability, suggesting right atrial pressure of 3 mmHg.  IAS/Shunts: No atrial level shunt detected by color flow  Doppler.   LEFT VENTRICLE PLAX 2D LVIDd:         4.80 cm Diastology LVIDs:         3.10 cm LV e' medial:    7.18 cm/s LV PW:         1.00 cm LV E/e' medial:  18.7 LV IVS:        1.00 cm LV e' lateral:   7.18 cm/s LV E/e' lateral: 18.7   RIGHT VENTRICLE  IVC RV Basal diam:  2.40 cm     IVC diam: 2.50 cm RV S prime:     14.70 cm/s TAPSE (M-mode): 2.8 cm  LEFT ATRIUM             Index        RIGHT ATRIUM           Index LA diam:        3.50 cm 1.73 cm/m   RA Area:     10.10 cm LA Vol (A2C):   78.6 ml 38.84 ml/m  RA Volume:   20.00 ml  9.88 ml/m LA Vol (A4C):   32.6 ml 16.11 ml/m LA Biplane Vol: 51.8 ml 25.59 ml/m AORTIC VALVE AV Vmax:           156.00 cm/s AV Vmean:          116.000 cm/s AV VTI:            0.371 m AV Peak Grad:      9.7 mmHg AV Mean Grad:      6.0 mmHg LVOT Vmax:         107.00 cm/s LVOT Vmean:        74.400 cm/s LVOT VTI:          0.243 m LVOT/AV VTI ratio: 0.65  AORTA Ao Root diam: 3.30 cm Ao Asc diam:  3.10 cm  MITRAL VALVE MV Area (PHT): 3.27 cm     SHUNTS MV Decel Time: 232 msec     Systemic VTI: 0.24 m MV E velocity: 134.00 cm/s MV A velocity: 115.00 cm/s MV E/A ratio:  1.17  Gabriel Gomez Electronically signed by Gabriel Gomez Signature Date/Time: 06/19/2021/11:16:58 AM    Final      CT SCANS  CT CORONARY MORPH W/CTA COR W/SCORE 12/02/2023  Addendum 12/03/2023  8:17 PM ADDENDUM REPORT: 12/03/2023 20:15  EXAM: OVER-READ INTERPRETATION  CT CHEST  The following report is an over-read performed by radiologist Dr. Fonda Mom [facility] on 12/03/2023. This over-read does not include interpretation of cardiac or coronary anatomy or pathology. The coronary calcium  score interpretation by the cardiologist is attached.  COMPARISON:  08/03/2023.  FINDINGS: Cardiovascular:  See findings discussed in the body of the report.  Mediastinum/Nodes: No suspicious adenopathy identified.  Imaged mediastinal structures are unremarkable.  Lungs/Pleura: There is dependent basilar subsegmental atelectasis. No pneumonia or pulmonary edema. No pleural effusion or pneumothorax.  Upper Abdomen: No acute abnormality.  Musculoskeletal: No chest wall abnormality. No acute osseous findings.  IMPRESSION: No acute extracardiac incidental findings.   Electronically Signed By: Fonda Field M.D. On: 12/03/2023 20:15  Narrative CLINICAL DATA:  CP  EXAM: Cardiac/Coronary  CTA  TECHNIQUE: The patient was scanned on a GE Apex scanner.  FINDINGS: A 120 kV prospective scan was triggered in the descending thoracic aorta at 111 HU's. Axial non-contrast 3 mm slices were carried out through the heart. The data set was analyzed on a dedicated work station and scored using the Agatson method. Gantry rotation speed was 250 msecs and collimation was .6 mm. No beta blockade and 0.8 mg of sl NTG was given. The 3D data set was reconstructed at 75% of the R-R cycle. Diastolic phases were analyzed on a dedicated work station using MPR, MIP and VRT modes. The patient received 80 cc of contrast.  Aorta: Normal size.  No calcifications.  No dissection.  Aortic Valve:  Trileaflet.  Mild calcifications.  Mild mitral annulus calcifications noted.  Coronary Arteries:  Normal coronary origin.  Right dominance.  RCA is a large dominant artery that gives rise to PDA and PLA. This artery is diffusely diseased with numerous mixed plaques in its proximal and mid portion with mild stenosis. In the proximal portion of PDA there is soft plaque with moderate stenosis of 50-69%.  Left main is a large artery that gives rise to LAD and LCX arteries.  LAD is a large vessel that has few calcified plaques with mild stenosis of 25-49%. In the mid portion of this artery mixed plaque is noted with mild stenosis of 25-49%. This artery gives rise to large D1. In the proximal portion of this artery  there are few mixed plaques with mild stenosis of 25-49%.  LCX is a non-dominant artery that gives rise to one large OM1 branch. There is mixed plaque in the proximal portion of this artery with mild stenosis of 25-49%. In the proximal portion of OM1 there is calcified plaque with mild stenosis of 25-49%  Other findings:  Normal pulmonary vein drainage into the left atrium. Rt middle pulmonary vein noted - normal variant.  Normal left atrial appendage not completely opacified, can not exclude thrombus.  Normal size of the pulmonary artery.  IMPRESSION: 1. Coronary calcium  score of 574. This was 77 percentile for age and sex matched control.  2. Normal coronary origin with right dominance.  3. CAD-RADS 3. Moderate stenosis. (50-69%) PDA. Consider symptom-guided anti-ischemic pharmacotherapy as well as risk factor modification per guideline directed care. Additional analysis with CT FFR will be submitted.  4. Plaque analysis: TPV 764 mm3, 73rd percentile, calcified plaque 119 mm3, non calcified plaque 645 mm3. Extensive TPV.  Electronically Signed: By: Gabriel Gomez M.D. On: 12/03/2023 10:59   CT SCANS  CT CARDIAC SCORING (SELF PAY ONLY) 10/03/2023  Addendum 10/08/2023  4:06 PM ADDENDUM REPORT: 10/08/2023 16:04  EXAM: OVER-READ INTERPRETATION  CT CHEST  The following report is an over-read performed by radiologist Dr. Jackquline Skates [facility] on 10/08/2023. This over-read does not include interpretation of cardiac or coronary anatomy or pathology. The interpretation by the cardiologist is attached.  COMPARISON:  Low-dose lung cancer screening CT 06/09/2021  FINDINGS: Limited view of the lung parenchyma demonstrates no suspicious nodularity. Airways are normal.  Limited view of the mediastinum demonstrates no adenopathy. Esophagus normal.  Limited view of the skeleton and chest wall is unremarkable.  IMPRESSION: No significant  extracardiac findings.   Electronically Signed By: Jackquline Boxer M.D. On: 10/08/2023 16:04  Narrative CLINICAL DATA:  Risk stratification: 63 Year-old Male  EXAM: Coronary Calcium  Score  TECHNIQUE: A gated, non-contrast computed tomography scan of the heart was performed using 2.5 mm slice thickness. Axial images were analyzed on a dedicated workstation. Calcium  scoring of the coronary arteries was performed using the Agatston method.  FINDINGS: Coronary Calcium  Score:  Left main: 8  Left anterior descending artery: 158  Left circumflex artery: 71  Right coronary artery: 352  Total: 589  Percentile: 85th  Ascending Aorta: Normal caliber. Aortic atherosclerosis.  Aortic Valve Calcium  score: 173  Mitral annular calcification: Mild, posterior, annular calcification.  Pericardium: Normal.  Non-cardiac: See separate report from [facility].  IMPRESSION: 1. Coronary calcium  score of 589. This was 85th percentile for age-, race-, and sex-matched controls.  RECOMMENDATIONS: Coronary artery calcium  (CAC) score is a strong predictor of incident coronary heart disease (CHD) and provides predictive information beyond traditional risk factors. CAC scoring is reasonable to use in the decision to  withhold, postpone, or initiate statin therapy in intermediate-risk or selected borderline-risk asymptomatic adults (age 87-75 years and LDL-C >=70 to <190 mg/dL) who do not have diabetes or established atherosclerotic cardiovascular disease (ASCVD).* In intermediate-risk (10-year ASCVD risk >=7.5% to <20%) adults or selected borderline-risk (10-year ASCVD risk >=5% to <7.5%) adults in whom a CAC score is measured for the purpose of making a treatment decision the following recommendations have been made:  If CAC=0, it is reasonable to withhold statin therapy and reassess in 5 to 10 years, as long as higher risk conditions are absent (diabetes mellitus, family  history of premature CHD in first degree relatives (males <55 years; females <65 years), cigarette smoking, or LDL >=190 mg/dL).  If CAC is 1 to 99, it is reasonable to initiate statin therapy for patients >=60 years of age.  If CAC is >=100 or >=75th percentile, it is reasonable to initiate statin therapy at any age.  Cardiology referral should be considered for patients with CAC scores >=400 or >=75th percentile.  *2018 AHA/ACC/AACVPR/AAPA/ABC/ACPM/ADA/AGS/APhA/ASPC/NLA/PCNA Guideline on the Management of Blood Cholesterol: A Report of the American College of Cardiology/American Heart Association Task Force on Clinical Practice Guidelines. J Am Coll Cardiol. 2019;73(24):3168-3209.  Stanly Leavens, [facility]  Electronically Signed: By: Stanly Leavens M.D. On: 10/08/2023 12:51     ______________________________________________________________________________________________          Recent Labs: 10/02/2023: ALT 19 10/03/2023: Hemoglobin 11.8; Magnesium  1.4; Platelets 310; TSH 2.189 11/17/2023: BUN 10; Creatinine, Ser 0.94; Potassium 4.7; Sodium 133  Recent Lipid Panel    Component Value Date/Time   CHOL 154 12/10/2021 0852   TRIG 82 12/10/2021 0852   HDL 74 12/10/2021 0852   CHOLHDL 2.1 12/10/2021 0852   CHOLHDL 4.9 05/29/2018 0351   VLDL 29 05/29/2018 0351   LDLCALC 65 12/10/2021 0852    Physical Exam:    VS:  BP 132/72   Pulse 78   Ht 5' 10 (1.778 m)   Wt 175 lb 6.4 oz (79.6 kg)   SpO2 99%   BMI 25.17 kg/m     Wt Readings from Last 3 Encounters:  01/07/24 175 lb 6.4 oz (79.6 kg)  11/17/23 175 lb (79.4 kg)  10/19/23 180 lb (81.6 kg)     GEN:  Well nourished, well developed in no acute distress his left upper extremity is not ischemic and he has full pulses HEENT: Normal NECK: No JVD; No carotid bruits LYMPHATICS: No lymphadenopathy CARDIAC: RRR, no murmurs, rubs, gallops RESPIRATORY:  Clear to auscultation without rales, wheezing or rhonchi   ABDOMEN: Soft, non-tender, non-distended MUSCULOSKELETAL:  No edema; No deformity  SKIN: Warm and dry NEUROLOGIC:  Alert and oriented x 3 PSYCHIATRIC:  Normal affect    Signed, Gabriel Leiter, [facility]  01/07/2024 12:07 PM    [facility]

## 2024-01-03 ENCOUNTER — Institutional Professional Consult (permissible substitution): Admitting: Cardiology

## 2024-01-06 DIAGNOSIS — M1990 Unspecified osteoarthritis, unspecified site: Secondary | ICD-10-CM | POA: Insufficient documentation

## 2024-01-06 DIAGNOSIS — J449 Chronic obstructive pulmonary disease, unspecified: Secondary | ICD-10-CM | POA: Insufficient documentation

## 2024-01-07 ENCOUNTER — Ambulatory Visit: Admitting: Cardiology

## 2024-01-07 ENCOUNTER — Encounter: Payer: Self-pay | Admitting: Cardiology

## 2024-01-07 ENCOUNTER — Ambulatory Visit: Attending: Cardiology | Admitting: Cardiology

## 2024-01-07 VITALS — BP 132/72 | HR 78 | Ht 70.0 in | Wt 175.4 lb

## 2024-01-07 DIAGNOSIS — I2510 Atherosclerotic heart disease of native coronary artery without angina pectoris: Secondary | ICD-10-CM

## 2024-01-07 DIAGNOSIS — R55 Syncope and collapse: Secondary | ICD-10-CM

## 2024-01-07 DIAGNOSIS — R931 Abnormal findings on diagnostic imaging of heart and coronary circulation: Secondary | ICD-10-CM

## 2024-01-07 DIAGNOSIS — I480 Paroxysmal atrial fibrillation: Secondary | ICD-10-CM | POA: Diagnosis not present

## 2024-01-07 DIAGNOSIS — I517 Cardiomegaly: Secondary | ICD-10-CM

## 2024-01-07 DIAGNOSIS — E118 Type 2 diabetes mellitus with unspecified complications: Secondary | ICD-10-CM

## 2024-01-07 DIAGNOSIS — I119 Hypertensive heart disease without heart failure: Secondary | ICD-10-CM

## 2024-01-07 DIAGNOSIS — E782 Mixed hyperlipidemia: Secondary | ICD-10-CM

## 2024-01-07 DIAGNOSIS — Z794 Long term (current) use of insulin: Secondary | ICD-10-CM

## 2024-01-07 DIAGNOSIS — Z7901 Long term (current) use of anticoagulants: Secondary | ICD-10-CM

## 2024-01-07 DIAGNOSIS — I951 Orthostatic hypotension: Secondary | ICD-10-CM

## 2024-01-07 DIAGNOSIS — I739 Peripheral vascular disease, unspecified: Secondary | ICD-10-CM

## 2024-01-07 NOTE — Patient Instructions (Signed)
 Medication Instructions:  Your physician recommends that you continue on your current medications as directed. Please refer to the Current Medication list given to you today.  *If you need a refill on your cardiac medications before your next appointment, please call your pharmacy*  Lab Work: None If you have labs (blood work) drawn today and your tests are completely normal, you will receive your results only by: MyChart Message (if you have MyChart) OR A paper copy in the mail If you have any lab test that is abnormal or we need to change your treatment, we will call you to review the results.  Testing/Procedures: None  Follow-Up: At [facility], you and your health needs are our priority.  As part of our continuing mission to provide you with exceptional heart care, our providers are all part of one team.  This team includes your primary Cardiologist (physician) and Advanced Practice Providers or APPs (Physician Assistants and Nurse Practitioners) who all work together to provide you with the care you need, when you need it.  Your next appointment:   3 month(s)  Provider:   Redell Leiter, MD    We recommend signing up for the patient portal called MyChart.  Sign up information is provided on this After Visit Summary.  MyChart is used to connect with patients for Virtual Visits (Telemedicine).  Patients are able to view lab/test results, encounter notes, upcoming appointments, etc.  Non-urgent messages can be sent to your provider as well.   To learn more about what you can do with MyChart, go to [email or web address].    Other Instructions Stop Aspirin  1 week before endoscopy  Please keep a BP log for 2 weeks and send by MyChart or mail.                          Name and DOB__________________________ Dr. Leiter [address] [place], [place] [address]  Blood Pressure Record Sheet To take your blood pressure, you will need a blood pressure machine. You can buy a blood  pressure machine (blood pressure monitor) at your clinic, drug store, or online. When choosing one, consider: An automatic monitor that has an arm cuff. A cuff that wraps snugly around your upper arm. You should be able to fit only one finger between your arm and the cuff. A device that stores blood pressure reading results. Do not choose a monitor that measures your blood pressure from your wrist or finger. Follow your health care provider's instructions for how to take your blood pressure. To use this form: Get one reading in the morning (a.m.) 1-2 hours after you take any medicines. Get one reading in the evening (p.m.) before supper.   Blood pressure log Date: _______________________  a.m. _____________________(1st reading) HR___________            p.m. _____________________(2nd reading) HR__________  Date: _______________________  a.m. _____________________(1st reading) HR___________            p.m. _____________________(2nd reading) HR__________  Date: _______________________  a.m. _____________________(1st reading) HR___________            p.m. _____________________(2nd reading) HR__________  Date: _______________________  a.m. _____________________(1st reading) HR___________            p.m. _____________________(2nd reading) HR__________  Date: _______________________  a.m. _____________________(1st reading) HR___________            p.m. _____________________(2nd reading) HR__________  Date: _______________________  a.m. _____________________(1st reading) HR___________  p.m. _____________________(2nd reading) HR__________  Date: _______________________  a.m. _____________________(1st reading) HR___________            p.m. _____________________(2nd reading) HR__________   This information is not intended to replace advice given to you by your health care provider. Make sure you discuss any questions you have with your health care  provider. Document Revised: 08/16/2019 Document Reviewed: 08/16/2019 Elsevier Patient Education  2021 [employer].

## 2024-01-18 ENCOUNTER — Ambulatory Visit: Admitting: Cardiology

## 2024-01-19 ENCOUNTER — Ambulatory Visit: Admitting: Cardiology

## 2024-01-20 ENCOUNTER — Ambulatory Visit: Admitting: Podiatry

## 2024-01-20 DIAGNOSIS — B351 Tinea unguium: Secondary | ICD-10-CM

## 2024-01-20 DIAGNOSIS — L84 Corns and callosities: Secondary | ICD-10-CM

## 2024-01-20 DIAGNOSIS — M2012 Hallux valgus (acquired), left foot: Secondary | ICD-10-CM | POA: Diagnosis not present

## 2024-01-20 DIAGNOSIS — M79675 Pain in left toe(s): Secondary | ICD-10-CM | POA: Diagnosis not present

## 2024-01-20 DIAGNOSIS — M79674 Pain in right toe(s): Secondary | ICD-10-CM | POA: Diagnosis not present

## 2024-01-20 DIAGNOSIS — E1151 Type 2 diabetes mellitus with diabetic peripheral angiopathy without gangrene: Secondary | ICD-10-CM

## 2024-01-20 NOTE — Progress Notes (Unsigned)
 Subjective:  Patient ID: Gabriel Gomez, male    DOB: Jul 19, 1960,  MRN: [id number]  Gabriel Gomez presents to clinic today for:  Chief Complaint  Patient presents with   [facility]    [facility] with callous on big toe.  A1c 8.4 in May ASA   Patient notes nails are thick and elongated, causing pain in shoe gear when ambulating.  He has a callus on the right great toe joint.  On the left foot, the great toe is under lapping the second toe.  He states that he is off of the blood thinners now and his vascular surgeon recommended the Watchman procedure to cut down on the risk of blood clots from his A-fib.  PCP is Silver Lamar LABOR, MD. 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   Acute laryngopharyngitis 05/21/2020   Acute left-sided low back pain with left-sided sciatica 11/06/2019   Acute pain of right shoulder 09/25/2022   Alcohol use 11/17/2023   Arthritis    Asymmetric SNHL (sensorineural hearing loss) 02/25/2022   Asymptomatic microscopic hematuria 11/06/2019   Atherosclerosis of coronary artery 11/17/2023   Balance problem 07/06/2023   Body mass index (BMI) 27.0-27.9, adult 08/02/2020   Bulging lumbar disc 03/07/2020   Chest pain of uncertain etiology 11/17/2023   Chronic health problem 10/02/2023   Chronic pain 01/07/2023   COPD (chronic obstructive pulmonary disease) (HCC)    Coronary artery disease    Diabetes mellitus without complication (HCC)    Dysfunction of both eustachian tubes 04/23/2022   Dysuria 04/27/2022   Essential hypertension, benign 08/03/2019   Gait disturbance 07/06/2023   GERD (gastroesophageal reflux disease)    Hypertension associated with diabetes (HCC) 10/02/2023   Hypokalemia 10/02/2023   Hypomagnesemia 06/29/2023   Hyponatremia 05/29/2018   Left ankle sprain 10/02/2023   Left pontine CVA (HCC) 04/23/2022   Chronic finding      Leukocytosis 05/29/2018   Long term current use of insulin  (HCC) 11/17/2023   Low back pain with sciatica 06/10/2020   Lumbar radiculopathy 03/07/2020   Mixed dyslipidemia 04/21/2022   Mixed hyperlipidemia 08/03/2019   Nausea and vomiting 11/17/2023   Need for tetanus, diphtheria, and acellular pertussis (Tdap) vaccine 11/06/2019   Orthostatic syncope 11/25/2023   Osteonecrosis, unspecified (HCC) 11/17/2023   PAD (peripheral artery disease) (HCC) 10/01/2022   Paroxysmal atrial fibrillation (HCC) 11/17/2023   Peripheral polyneuropathy 07/01/2020   Polyneuropathy due to type 2 diabetes mellitus (HCC) 04/21/2022   Prostate cancer screening 11/15/2019   Respiratory infection 03/11/2022   Rib pain on right side 05/11/2022   Right flank pain 04/21/2022   Right knee meniscal tear 10/21/2021   Right leg weakness 07/06/2023   Syncope and collapse 10/02/2023   Tobacco user 11/17/2023   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 male in NAD. AAO x 3.  Vascular Examination: Patient has palpable DP pulse, absent PT pulse bilateral.  Delayed capillary refill bilateral toes.  Sparse digital hair bilateral.  Proximal to distal cooling WNL bilateral.    Dermatological Examination: Interspaces are clear with no open lesions noted bilateral.  Skin is shiny and atrophic bilateral.  Nails are 3-2mm thick, with yellowish/brown discoloration, subungual debris and distal onycholysis  x10.  There is pain with compression of nails x10.  There are hyperkeratotic lesions noted on the plantar medial aspect of the right first MPJ  Orthopedic examination: There is lateral angulation of the left great toe which is under lapping the second toe.  This is semireducible with manipulation..     Latest Ref Rng & Units 10/03/2023    7:17 AM  Hemoglobin A1C  Hemoglobin-A1c 4.8 - 5.6 % 8.4    Patient qualifies for at-risk foot care because of diabetes with PVD.  Assessment/Plan: 1. Pain due to onychomycosis of toenails of both feet   2. Callus of foot   3. Type II diabetes mellitus with peripheral circulatory disorder (HCC)   4. Hallux valgus, left    Mycotic nails x10 were sharply debrided with sterile nail nippers and power debriding burr to decrease bulk and length.  Hyperkeratotic lesion at the right first MPJ was shaved with #312 blade.  Patient was dispensed a gel toe spacers for the left first interspace.  Will see if this helps keep the hallux from under lapping the second toe and from causing irritation.  Return in about 3 months (around 04/20/2024) for [facility].   Awanda CHARM Imperial, DPM, FACFAS [facility]     [address] [place], [place] [address]                Office [phone]  Fax [phone]

## 2024-01-25 ENCOUNTER — Encounter: Payer: Self-pay | Admitting: Cardiology

## 2024-03-06 IMAGING — MR MR KNEE*R* W/O CM
4 of 7 series · 19 of 40 positions shown · non-contrast
Comparison: None Available.

CLINICAL DATA: Medial and posterior knee pain, swelling x 1 month,

EXAM:
MRI OF THE RIGHT KNEE WITHOUT CONTRAST
TECHNIQUE: Multiplanar, multisequence MR imaging of the knee was performed. No
intravenous contrast was administered.

[Series 2: T2 fat-sat · axial · 4.0mm · 0.29mm/px · z∈[-93,+12]mm · 5 of 30 slices shown]
[im 1/30]
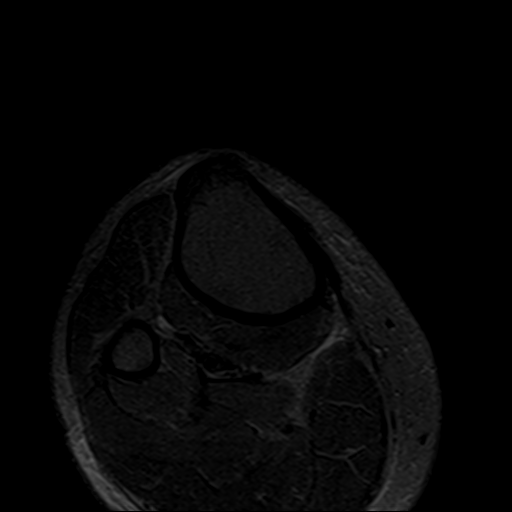
[im 5/30]
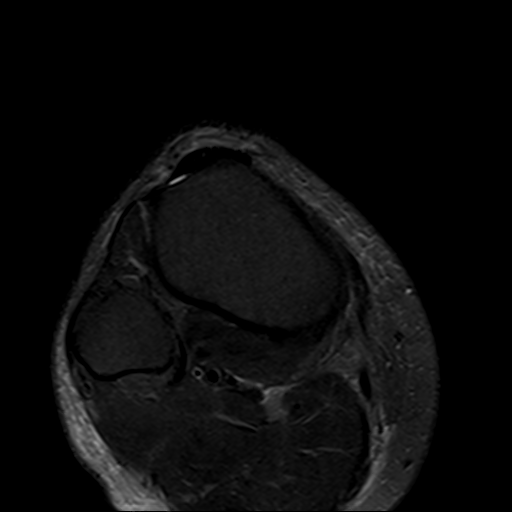
[im 10/30]
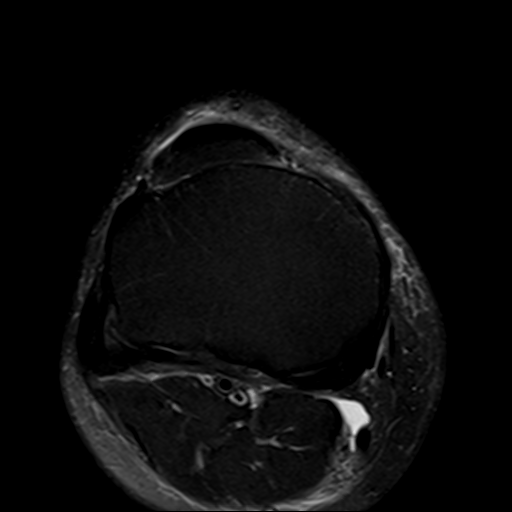
[im 15/30]
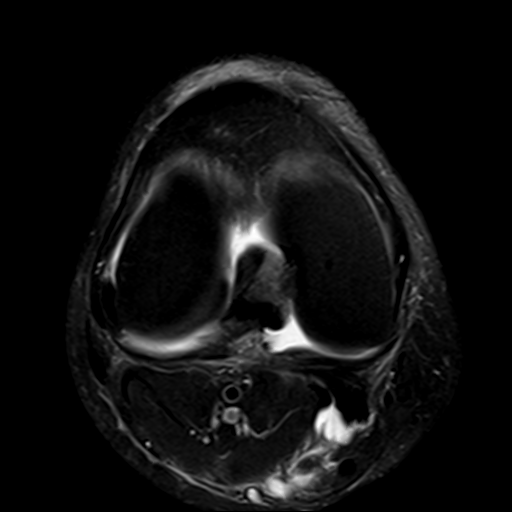
[im 25/30]
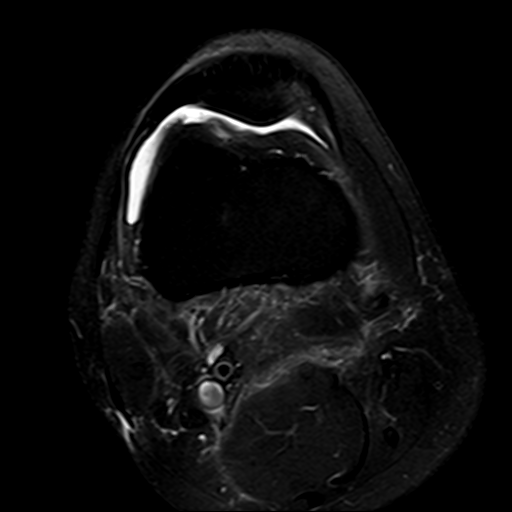

[Series 5: PD fat-sat · coronal · 3.0mm · 0.29mm/px · 6 of 30 slices shown (1 of 3)]
[im 1/30]
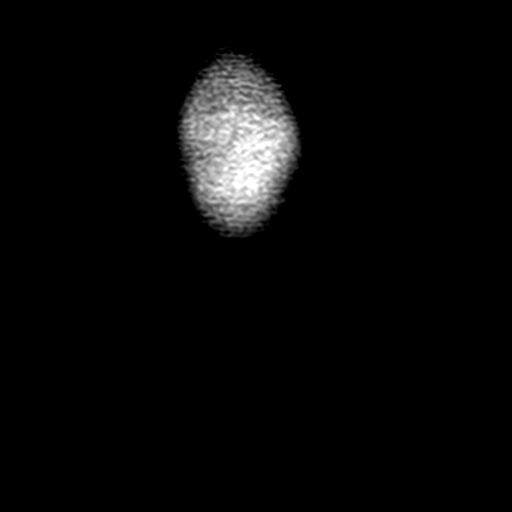
[im 6/30]
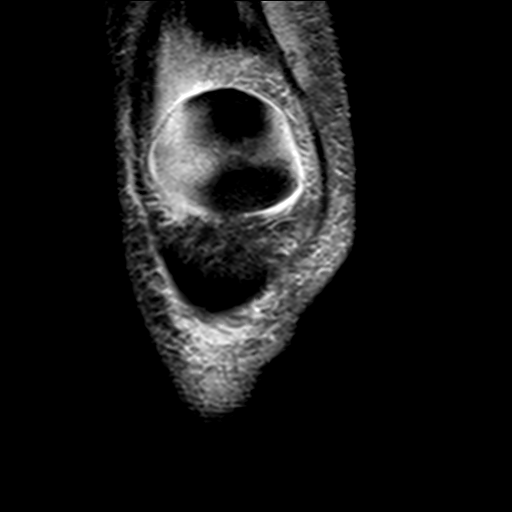
[im 12/30]
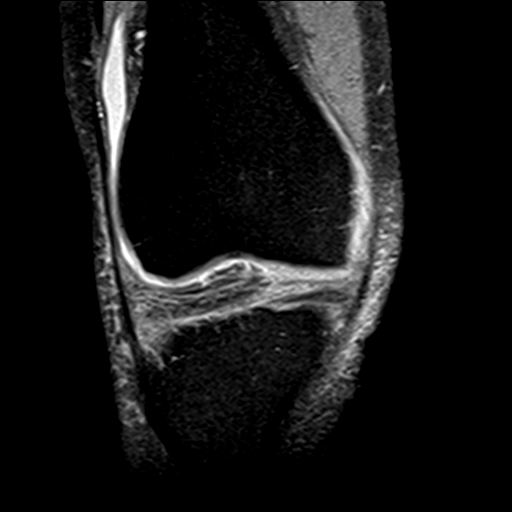
[im 18/30]
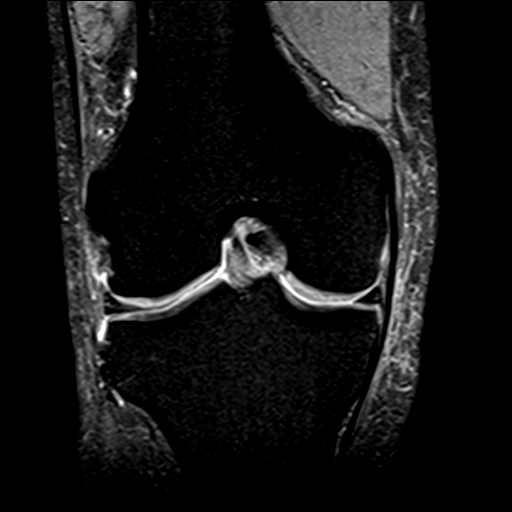
[im 24/30]
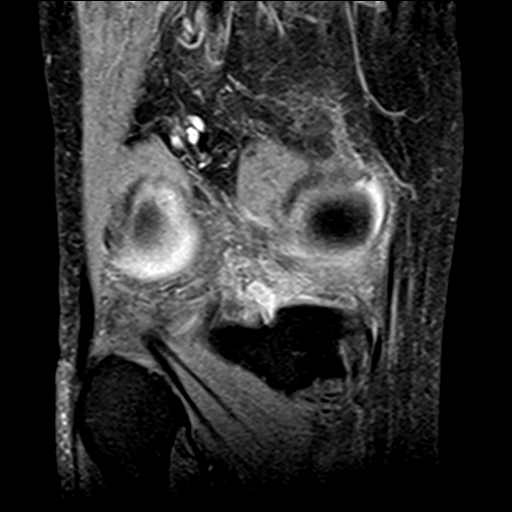
[im 30/30]
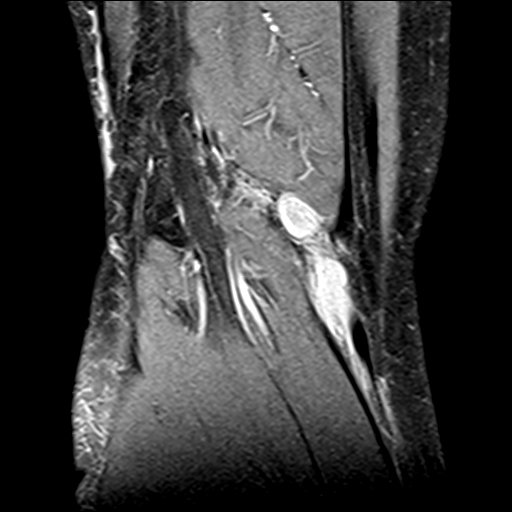

[Series 6: PD fat-sat · sagittal · 3.0mm · 0.29mm/px · 6 of 30 slices shown (2 of 3)]
[im 1/30]
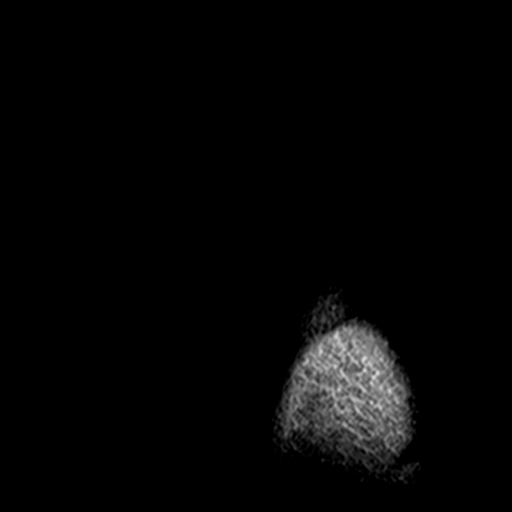
[im 6/30]
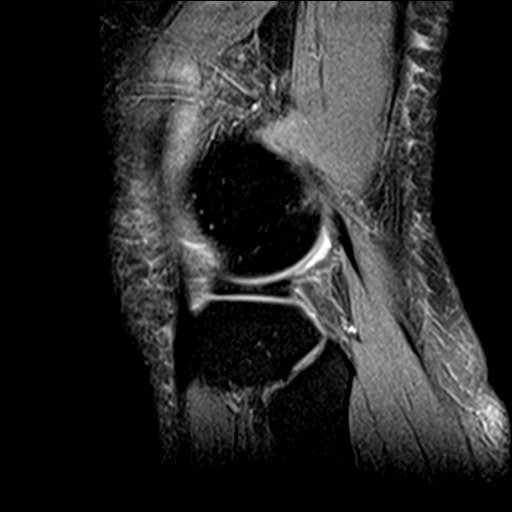
[im 12/30]
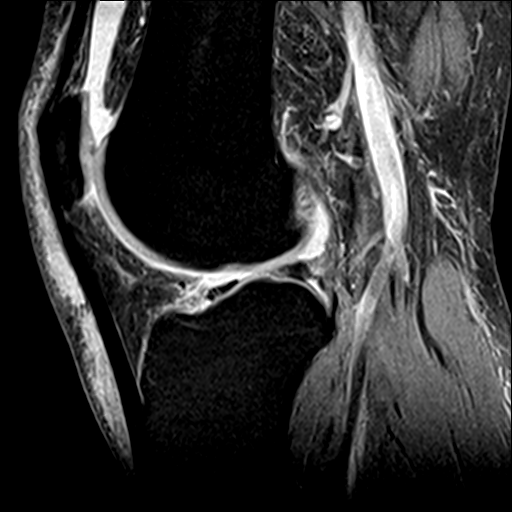
[im 18/30]
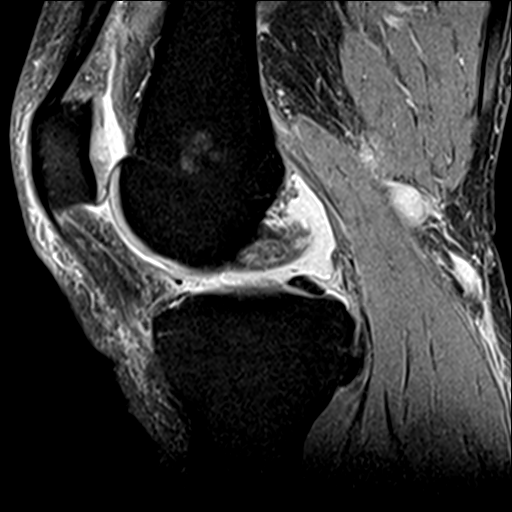
[im 24/30]
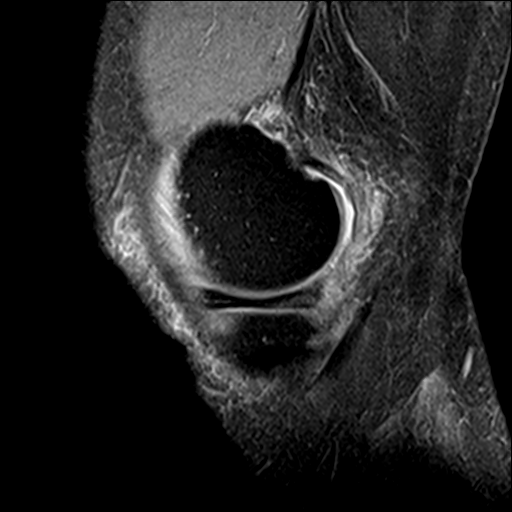
[im 30/30]
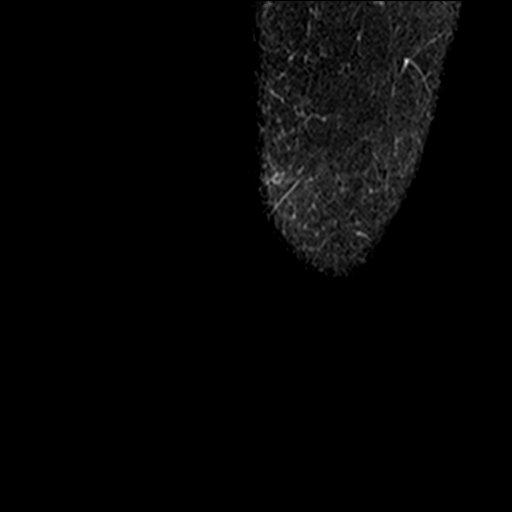

[Series 8: PD fat-sat · oblique · 2.0mm · 0.29mm/px · 2 of 11 slices shown (3 of 3)]
[im 1/11]
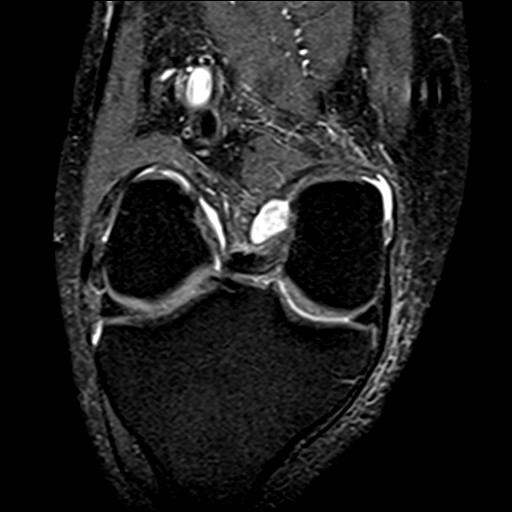
[im 11/11]
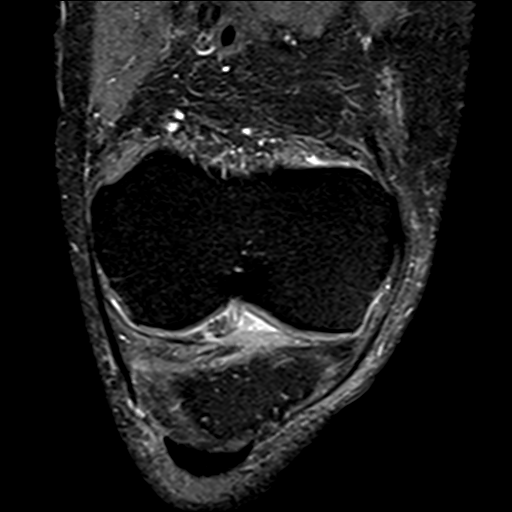

[19 of 40 positions shown; findings below may reference images not displayed]

FINDINGS: MENISCI

Medial: Small undersurface tear of the posterior horn-body junction
of the medial meniscus.

Lateral: Fraying of the free edge of the posterior horn of the
lateral meniscus.

LIGAMENTS

Cruciates: ACL and PCL are intact.

Collaterals: Medial collateral ligament is intact. Lateral
collateral ligament complex is intact.

CARTILAGE

Patellofemoral:  Chondromalacia of the patellar apex.

Medial: High-grade partial-thickness cartilage loss of the anterior
aspect of the weight-bearing surface of the medial femoral condyle.
Partial-thickness cartilage loss of the medial tibial plateau.

Lateral:  No chondral defect.

JOINT: Moderate joint effusion. Joint effusion. Normal Sodiya
Norihisa. No plical thickening.

POPLITEAL FOSSA: Popliteus tendon is intact. Small Baker's cyst.
Small amount of fluid superficial to the medial gastrocnemius muscle
likely reflecting a partially ruptured Baker's cyst.

EXTENSOR MECHANISM: Intact quadriceps tendon. Intact patellar
tendon. Intact lateral patellar retinaculum. Intact medial patellar
retinaculum. Intact MPFL.

BONES: No aggressive osseous lesion. No fracture or dislocation.

Other: No fluid collection or hematoma. Muscles are normal.
IMPRESSION: 1. Small undersurface tear of the posterior horn-body junction of
the medial meniscus.
2. High-grade partial-thickness cartilage loss of the anterior
aspect of the weight-bearing surface of the medial femoral condyle.
Partial-thickness cartilage loss of the medial tibial plateau.
3. Moderate joint effusion.
4. Small partially ruptured Baker's cyst.

## 2024-03-07 NOTE — Progress Notes (Unsigned)
 Cardiology Office Note:    Date:  03/08/2024   ID:  Gabriel Gomez, DOB 01/12/1961, MRN [id number]  PCP:  Silver Lamar LABOR, MD  Cardiologist:  Redell Leiter, MD    Referring MD: Silver Lamar LABOR, MD    ASSESSMENT:    1. Paroxysmal atrial fibrillation (HCC)   2. CAD in native artery   3. Syncope and collapse   4. Orthostatic hypotension   5. Hypertensive heart disease without heart failure   6. Mixed hyperlipidemia    PLAN:    In order of problems listed above:  His primary problem now is his atrial fibrillation and need for anticoagulation with history of stroke Restart Eliquis  5 mg twice daily and referred for Watchman device they request Dr. Eloy this taken care of her for and if his. No recurrent syncope orthostatic hypotension Continue his current symptoms in view of his previous symptomatic attention can intensify therapy Continue his high intensity statin with CAD   Next appointment: 3 months   Medication Adjustments/Labs and Tests Ordered: Current medicines are reviewed at length with the patient today.  Concerns regarding medicines are outlined above.  No orders of the defined types were placed in this encounter.  Meds ordered this encounter  Medications   apixaban  (ELIQUIS ) 5 MG TABS tablet    Sig: Take 1 tablet (5 mg total) by mouth 2 (two) times daily.    Dispense:  180 tablet    Refill:  3     History of Present Illness:    Gabriel Gomez is a 63 y.o. 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  His wife is present participates in evaluation decision making.  She tells me that he has been seen by GI and endoscopy and they were told that the results were good  and they could restart his blood thinner Previously had discussed with the patient about Watchman device with his previous GI bleed he would like to pursue it He requests to see Dr. Fairy Gull at [facility] and I will start him on his Eliquis . Overall he has improved he has had no anginal discomfort no palpitation and no recurrent episodes of syncope  Compliance with diet, lifestyle and medications: Yes Past Medical History:  Diagnosis Date   Abnormal laboratory test 11/15/2019   Accelerated idioventricular rhythm 10/02/2023   Acute laryngopharyngitis 05/21/2020   Acute left-sided low back pain with left-sided sciatica 11/06/2019   Acute pain of right shoulder 09/25/2022   Alcohol use 11/17/2023   Arthritis    Asymmetric SNHL (sensorineural hearing loss) 02/25/2022   Asymptomatic microscopic hematuria 11/06/2019   Atherosclerosis of coronary artery 11/17/2023   Balance problem 07/06/2023   Body mass index (BMI) 27.0-27.9, adult 08/02/2020   Bulging lumbar disc 03/07/2020   Chest pain of uncertain etiology 11/17/2023   Chronic health problem 10/02/2023   Chronic pain 01/07/2023   COPD (chronic obstructive pulmonary disease) (HCC)    Coronary artery disease    Diabetes mellitus without complication (HCC)    Dysfunction of both eustachian tubes 04/23/2022   Dysuria 04/27/2022   Essential hypertension, benign 08/03/2019   Gait disturbance 07/06/2023   GERD (gastroesophageal reflux disease)    Hypertension associated with diabetes (HCC) 10/02/2023   Hypokalemia 10/02/2023   Hypomagnesemia 06/29/2023   Hyponatremia 05/29/2018  Left ankle sprain 10/02/2023   Left pontine CVA (HCC) 04/23/2022   Chronic finding     Leukocytosis 05/29/2018   Long term current use of insulin  (HCC) 11/17/2023   Low back pain with sciatica 06/10/2020   Lumbar radiculopathy 03/07/2020   Mixed dyslipidemia 04/21/2022   Mixed hyperlipidemia 08/03/2019   Nausea and vomiting 11/17/2023   Need for  tetanus, diphtheria, and acellular pertussis (Tdap) vaccine 11/06/2019   Orthostatic syncope 11/25/2023   Osteonecrosis, unspecified (HCC) 11/17/2023   PAD (peripheral artery disease) 10/01/2022   Paroxysmal atrial fibrillation (HCC) 11/17/2023   Peripheral polyneuropathy 07/01/2020   Polyneuropathy due to type 2 diabetes mellitus (HCC) 04/21/2022   Prostate cancer screening 11/15/2019   Respiratory infection 03/11/2022   Rib pain on right side 05/11/2022   Right flank pain 04/21/2022   Right knee meniscal tear 10/21/2021   Right leg weakness 07/06/2023   Syncope and collapse 10/02/2023   Tobacco user 11/17/2023   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/02/2023   Type 2 diabetes mellitus without complication, with long-term current use of insulin  (HCC) 08/03/2019   Unstable angina (HCC) 05/28/2018   Urinary urgency 04/27/2022   Vitamin D  deficiency 06/29/2023    Current Medications: Current Meds  Medication Sig   ACCU-CHEK GUIDE test strip USE ONE strip FOUR TIMES DAILY   Accu-Chek Softclix Lancets lancets Use as instructed   albuterol  (VENTOLIN  HFA) 108 (90 Base) MCG/ACT inhaler Inhale 2 puffs into the lungs every 6 (six) hours as needed for wheezing or shortness of breath.   alfuzosin (UROXATRAL) 10 MG 24 hr tablet Take 10 mg by mouth daily with breakfast.   apixaban  (ELIQUIS ) 5 MG TABS tablet Take 1 tablet (5 mg total) by mouth 2 (two) times daily.   atorvastatin  (LIPITOR ) 80 MG tablet TAKE ONE TABLET BY MOUTH ONCE DAILY AT 6pm   [Paused] bethanechol (URECHOLINE) 50 MG tablet Take 50 mg by mouth 2 (two) times daily.   Blood Glucose Monitoring Suppl (ACCU-CHEK GUIDE) w/Device KIT To use to check glucose qd   Cetirizine-Pseudoephedrine (ZYRTEC-D ALLERGY & SINUS PO) Take 1 tablet by mouth daily.   DULoxetine  (CYMBALTA ) 60 MG capsule Take 60 mg by mouth daily.   LANTUS  SOLOSTAR 100 UNIT/ML Solostar Pen Inject 30 Units into the skin at bedtime.    methocarbamol (ROBAXIN) 500 MG tablet Take 500 mg by mouth every 6 (six) hours as needed for muscle spasms.   nitroGLYCERIN  (NITROSTAT ) 0.4 MG SL tablet Place 1 tablet (0.4 mg total) under the tongue every 5 (five) minutes x 3 doses as needed for chest pain.   NOVOLOG  FLEXPEN 100 UNIT/ML FlexPen inject 15 units EVERY MORNING, 10 units AT LUNCH AND 10 units EVERY EVENING   pantoprazole  (PROTONIX ) 40 MG tablet Take 40 mg by mouth 2 (two) times daily.   pregabalin  (LYRICA ) 100 MG capsule Take 100 mg by mouth 2 (two) times daily.   SURE COMFORT PEN NEEDLES 32G X 4 MM MISC AS DIRECTED FOUR TIMES DAILY      EKGs/Labs/Other Studies Reviewed:    The following studies were reviewed today:  Cardiac Studies & Procedures   ______________________________________________________________________________________________ CARDIAC CATHETERIZATION  CARDIAC CATHETERIZATION 05/30/2018  Conclusion  Prox RCA lesion is 30% stenosed.  Mid RCA lesion is 30% stenosed.  Dist RCA lesion is 40% stenosed.  Dist LM to Ost LAD lesion is 40% stenosed.  Prox LAD lesion is 40% stenosed.  Ost 1st Diag lesion is 30% stenosed.  Prox Cx to Mid Cx  lesion is 40% stenosed.  Post Atrio lesion is 100% stenosed.  1. Mild diffuse nonobstructive CAD as outlined above (no lesions greater than 50% in any of the major epicardial vessels) 2. Normal LV function by echo and normal LVEDP 3. There is a distal RCA branch vessel occlusion involving a tiny PLA vessel < 1 mm in caliber  Suspect noncardiac CP. Recommend medical therapy, tobacco cessation for risk reduction.  Findings Coronary Findings Diagnostic  Dominance: Right  Left Main Dist LM to Ost LAD lesion is 40% stenosed.  Left Anterior Descending Prox LAD lesion is 40% stenosed.  First Diagonal Branch Ost 1st Diag lesion is 30% stenosed.  Left Circumflex Prox Cx to Mid Cx lesion is 40% stenosed.  Right Coronary Artery Prox RCA lesion is 30% stenosed. Mid  RCA lesion is 30% stenosed. Dist RCA lesion is 40% stenosed.  Right Posterior Atrioventricular Artery Post Atrio lesion is 100% stenosed. distal occlusion, tiny vessel  Intervention  No interventions have been documented.     ECHOCARDIOGRAM  ECHOCARDIOGRAM COMPLETE 06/19/2021  Narrative ECHOCARDIOGRAM REPORT    Patient Name:   [facility] Date of Exam: 06/19/2021 Medical Rec #:  [id number]       Height:       70.0 in Accession #:    [id number]      Weight:       186.0 lb Date of Birth:  Aug 29, 1960       BSA:          2.024 m Patient Age:    61 years        BP:           146/76 mmHg Patient Gender: M               HR:           76 bpm. Exam Location:  [facility]  Procedure: 2D Echo, Cardiac Doppler, Color Doppler and Strain Analysis  Indications:    Aortic valve calcification [I35.9 (ICD-10-CM)]  History:        Patient has prior history of Echocardiogram examinations, most recent 05/30/2018. Aortic Valve Disease; Risk Factors:Hypertension and Dyslipidemia.  Sonographer:    Charlie Jointer RDCS Referring Phys: [id number] SARA DAVIS  IMPRESSIONS   1. GLS -16.3. Left ventricular ejection fraction, by estimation, is 60 to 65%. The left ventricle has normal function. The left ventricle has no regional wall motion abnormalities. Left ventricular diastolic parameters are consistent with Grade II diastolic dysfunction (pseudonormalization). 2. Right ventricular systolic function is normal. The right ventricular size is normal. 3. The mitral valve is normal in structure. No evidence of mitral valve regurgitation. No evidence of mitral stenosis. 4. Mild calcifications of non coroary cusp with no stenosis.. The aortic valve is normal in structure. There is mild calcification of the aortic valve. There is mild thickening of the aortic valve. Aortic valve regurgitation is not visualized. No aortic stenosis is present. 5. The inferior vena cava is normal in size with greater than 50%  respiratory variability, suggesting right atrial pressure of 3 mmHg.  FINDINGS Left Ventricle: GLS -16.3. Left ventricular ejection fraction, by estimation, is 60 to 65%. The left ventricle has normal function. The left ventricle has no regional wall motion abnormalities. The left ventricular internal cavity size was normal in size. There is no left ventricular hypertrophy. Left ventricular diastolic parameters are consistent with Grade II diastolic dysfunction (pseudonormalization).  Right Ventricle: The right ventricular size is normal. No increase in right ventricular wall thickness. Right  ventricular systolic function is normal.  Left Atrium: Left atrial size was normal in size.  Right Atrium: Right atrial size was normal in size.  Pericardium: There is no evidence of pericardial effusion.  Mitral Valve: The mitral valve is normal in structure. No evidence of mitral valve regurgitation. No evidence of mitral valve stenosis.  Tricuspid Valve: The tricuspid valve is normal in structure. Tricuspid valve regurgitation is not demonstrated. No evidence of tricuspid stenosis.  Aortic Valve: Mild calcifications of non coroary cusp with no stenosis. The aortic valve is normal in structure. There is mild calcification of the aortic valve. There is mild thickening of the aortic valve. Aortic valve regurgitation is not visualized. No aortic stenosis is present. Aortic valve mean gradient measures 6.0 mmHg. Aortic valve peak gradient measures 9.7 mmHg.  Pulmonic Valve: The pulmonic valve was normal in structure. Pulmonic valve regurgitation is not visualized. No evidence of pulmonic stenosis.  Aorta: The aortic root is normal in size and structure.  Venous: The inferior vena cava is normal in size with greater than 50% respiratory variability, suggesting right atrial pressure of 3 mmHg.  IAS/Shunts: No atrial level shunt detected by color flow Doppler.   LEFT VENTRICLE PLAX 2D LVIDd:          4.80 cm Diastology LVIDs:         3.10 cm LV e' medial:    7.18 cm/s LV PW:         1.00 cm LV E/e' medial:  18.7 LV IVS:        1.00 cm LV e' lateral:   7.18 cm/s LV E/e' lateral: 18.7   RIGHT VENTRICLE             IVC RV Basal diam:  2.40 cm     IVC diam: 2.50 cm RV S prime:     14.70 cm/s TAPSE (M-mode): 2.8 cm  LEFT ATRIUM             Index        RIGHT ATRIUM           Index LA diam:        3.50 cm 1.73 cm/m   RA Area:     10.10 cm LA Vol (A2C):   78.6 ml 38.84 ml/m  RA Volume:   20.00 ml  9.88 ml/m LA Vol (A4C):   32.6 ml 16.11 ml/m LA Biplane Vol: 51.8 ml 25.59 ml/m AORTIC VALVE AV Vmax:           156.00 cm/s AV Vmean:          116.000 cm/s AV VTI:            0.371 m AV Peak Grad:      9.7 mmHg AV Mean Grad:      6.0 mmHg LVOT Vmax:         107.00 cm/s LVOT Vmean:        74.400 cm/s LVOT VTI:          0.243 m LVOT/AV VTI ratio: 0.65  AORTA Ao Root diam: 3.30 cm Ao Asc diam:  3.10 cm  MITRAL VALVE MV Area (PHT): 3.27 cm     SHUNTS MV Decel Time: 232 msec     Systemic VTI: 0.24 m MV E velocity: 134.00 cm/s MV A velocity: 115.00 cm/s MV E/A ratio:  1.17  Lamar Fitch MD Electronically signed by Lamar Fitch MD Signature Date/Time: 06/19/2021/11:16:58 AM    Final      CT  SCANS  CT CORONARY MORPH W/CTA COR W/SCORE 12/02/2023  Addendum 12/03/2023  8:17 PM ADDENDUM REPORT: 12/03/2023 20:15  EXAM: OVER-READ INTERPRETATION  CT CHEST  The following report is an over-read performed by radiologist Dr. Fonda Mom [facility] on 12/03/2023. This over-read does not include interpretation of cardiac or coronary anatomy or pathology. The coronary calcium  score interpretation by the cardiologist is attached.  COMPARISON:  08/03/2023.  FINDINGS: Cardiovascular:  See findings discussed in the body of the report.  Mediastinum/Nodes: No suspicious adenopathy identified. Imaged mediastinal structures are  unremarkable.  Lungs/Pleura: There is dependent basilar subsegmental atelectasis. No pneumonia or pulmonary edema. No pleural effusion or pneumothorax.  Upper Abdomen: No acute abnormality.  Musculoskeletal: No chest wall abnormality. No acute osseous findings.  IMPRESSION: No acute extracardiac incidental findings.   Electronically Signed By: Fonda Field M.D. On: 12/03/2023 20:15  Narrative CLINICAL DATA:  CP  EXAM: Cardiac/Coronary  CTA  TECHNIQUE: The patient was scanned on a GE Apex scanner.  FINDINGS: A 120 kV prospective scan was triggered in the descending thoracic aorta at 111 HU's. Axial non-contrast 3 mm slices were carried out through the heart. The data set was analyzed on a dedicated work station and scored using the Agatson method. Gantry rotation speed was 250 msecs and collimation was .6 mm. No beta blockade and 0.8 mg of sl NTG was given. The 3D data set was reconstructed at 75% of the R-R cycle. Diastolic phases were analyzed on a dedicated work station using MPR, MIP and VRT modes. The patient received 80 cc of contrast.  Aorta: Normal size.  No calcifications.  No dissection.  Aortic Valve:  Trileaflet.  Mild calcifications.  Mild mitral annulus calcifications noted.  Coronary Arteries:  Normal coronary origin.  Right dominance.  RCA is a large dominant artery that gives rise to PDA and PLA. This artery is diffusely diseased with numerous mixed plaques in its proximal and mid portion with mild stenosis. In the proximal portion of PDA there is soft plaque with moderate stenosis of 50-69%.  Left main is a large artery that gives rise to LAD and LCX arteries.  LAD is a large vessel that has few calcified plaques with mild stenosis of 25-49%. In the mid portion of this artery mixed plaque is noted with mild stenosis of 25-49%. This artery gives rise to large D1. In the proximal portion of this artery there are few mixed plaques with mild  stenosis of 25-49%.  LCX is a non-dominant artery that gives rise to one large OM1 branch. There is mixed plaque in the proximal portion of this artery with mild stenosis of 25-49%. In the proximal portion of OM1 there is calcified plaque with mild stenosis of 25-49%  Other findings:  Normal pulmonary vein drainage into the left atrium. Rt middle pulmonary vein noted - normal variant.  Normal left atrial appendage not completely opacified, can not exclude thrombus.  Normal size of the pulmonary artery.  IMPRESSION: 1. Coronary calcium  score of 574. This was 52 percentile for age and sex matched control.  2. Normal coronary origin with right dominance.  3. CAD-RADS 3. Moderate stenosis. (50-69%) PDA. Consider symptom-guided anti-ischemic pharmacotherapy as well as risk factor modification per guideline directed care. Additional analysis with CT FFR will be submitted.  4. Plaque analysis: TPV 764 mm3, 73rd percentile, calcified plaque 119 mm3, non calcified plaque 645 mm3. Extensive TPV.  Electronically Signed: By: Lamar Fitch M.D. On: 12/03/2023 10:59   CT SCANS  CT CARDIAC SCORING (SELF PAY ONLY) 10/03/2023  Addendum 10/08/2023  4:06 PM ADDENDUM REPORT: 10/08/2023 16:04  EXAM: OVER-READ INTERPRETATION  CT CHEST  The following report is an over-read performed by radiologist Dr. Jackquline Skates [facility] on 10/08/2023. This over-read does not include interpretation of cardiac or coronary anatomy or pathology. The interpretation by the cardiologist is attached.  COMPARISON:  Low-dose lung cancer screening CT 06/09/2021  FINDINGS: Limited view of the lung parenchyma demonstrates no suspicious nodularity. Airways are normal.  Limited view of the mediastinum demonstrates no adenopathy. Esophagus normal.  Limited view of the skeleton and chest wall is unremarkable.  IMPRESSION: No significant extracardiac findings.   Electronically  Signed By: Jackquline Boxer M.D. On: 10/08/2023 16:04  Narrative CLINICAL DATA:  Risk stratification: 63 Year-old Male  EXAM: Coronary Calcium  Score  TECHNIQUE: A gated, non-contrast computed tomography scan of the heart was performed using 2.5 mm slice thickness. Axial images were analyzed on a dedicated workstation. Calcium  scoring of the coronary arteries was performed using the Agatston method.  FINDINGS: Coronary Calcium  Score:  Left main: 8  Left anterior descending artery: 158  Left circumflex artery: 71  Right coronary artery: 352  Total: 589  Percentile: 85th  Ascending Aorta: Normal caliber. Aortic atherosclerosis.  Aortic Valve Calcium  score: 173  Mitral annular calcification: Mild, posterior, annular calcification.  Pericardium: Normal.  Non-cardiac: See separate report from [facility].  IMPRESSION: 1. Coronary calcium  score of 589. This was 85th percentile for age-, race-, and sex-matched controls.  RECOMMENDATIONS: Coronary artery calcium  (CAC) score is a strong predictor of incident coronary heart disease (CHD) and provides predictive information beyond traditional risk factors. CAC scoring is reasonable to use in the decision to withhold, postpone, or initiate statin therapy in intermediate-risk or selected borderline-risk asymptomatic adults (age 43-75 years and LDL-C >=70 to <190 mg/dL) who do not have diabetes or established atherosclerotic cardiovascular disease (ASCVD).* In intermediate-risk (10-year ASCVD risk >=7.5% to <20%) adults or selected borderline-risk (10-year ASCVD risk >=5% to <7.5%) adults in whom a CAC score is measured for the purpose of making a treatment decision the following recommendations have been made:  If CAC=0, it is reasonable to withhold statin therapy and reassess in 5 to 10 years, as long as higher risk conditions are absent (diabetes mellitus, family history of premature CHD in first  degree relatives (males <55 years; females <65 years), cigarette smoking, or LDL >=190 mg/dL).  If CAC is 1 to 99, it is reasonable to initiate statin therapy for patients >=68 years of age.  If CAC is >=100 or >=75th percentile, it is reasonable to initiate statin therapy at any age.  Cardiology referral should be considered for patients with CAC scores >=400 or >=75th percentile.  *2018 AHA/ACC/AACVPR/AAPA/ABC/ACPM/ADA/AGS/APhA/ASPC/NLA/PCNA Guideline on the Management of Blood Cholesterol: A Report of the American College of Cardiology/American Heart Association Task Force on Clinical Practice Guidelines. J Am Coll Cardiol. 2019;73(24):3168-3209.  Stanly Leavens, MD  Electronically Signed: By: Stanly Leavens M.D. On: 10/08/2023 12:51     ______________________________________________________________________________________________          Recent Labs: 10/02/2023: ALT 19 10/03/2023: Hemoglobin 11.8; Magnesium  1.4; Platelets 310; TSH 2.189 11/17/2023: BUN 10; Creatinine, Ser 0.94; Potassium 4.7; Sodium 133  Recent Lipid Panel    Component Value Date/Time   CHOL 154 12/10/2021 0852   TRIG 82 12/10/2021 0852   HDL 74 12/10/2021 0852   CHOLHDL 2.1 12/10/2021 0852   CHOLHDL 4.9 05/29/2018 0351   VLDL  29 05/29/2018 0351   LDLCALC 65 12/10/2021 0852    Physical Exam:    VS:  BP (!) 150/80   Pulse 78   Ht 5' 10 (1.778 m)   Wt 172 lb (78 kg)   SpO2 99%   BMI 24.68 kg/m     Wt Readings from Last 3 Encounters:  03/08/24 172 lb (78 kg)  01/07/24 175 lb 6.4 oz (79.6 kg)  11/17/23 175 lb (79.4 kg)     GEN:  Well nourished, well developed in no acute distress HEENT: Normal NECK: No JVD; No carotid bruits LYMPHATICS: No lymphadenopathy CARDIAC: RRR, no murmurs, rubs, gallops RESPIRATORY:  Clear to auscultation without rales, wheezing or rhonchi  ABDOMEN: Soft, non-tender, non-distended MUSCULOSKELETAL:  No edema; No deformity  SKIN: Warm and  dry NEUROLOGIC:  Alert and oriented x 3 PSYCHIATRIC:  Normal affect    Signed, Redell Leiter, MD  03/08/2024 2:02 PM    [facility]

## 2024-03-08 ENCOUNTER — Ambulatory Visit: Attending: Cardiology | Admitting: Cardiology

## 2024-03-08 ENCOUNTER — Encounter: Payer: Self-pay | Admitting: Cardiology

## 2024-03-08 VITALS — BP 150/80 | HR 78 | Ht 70.0 in | Wt 172.0 lb

## 2024-03-08 DIAGNOSIS — I119 Hypertensive heart disease without heart failure: Secondary | ICD-10-CM | POA: Diagnosis not present

## 2024-03-08 DIAGNOSIS — E782 Mixed hyperlipidemia: Secondary | ICD-10-CM

## 2024-03-08 DIAGNOSIS — I480 Paroxysmal atrial fibrillation: Secondary | ICD-10-CM

## 2024-03-08 DIAGNOSIS — I2510 Atherosclerotic heart disease of native coronary artery without angina pectoris: Secondary | ICD-10-CM

## 2024-03-08 DIAGNOSIS — I951 Orthostatic hypotension: Secondary | ICD-10-CM | POA: Diagnosis not present

## 2024-03-08 DIAGNOSIS — R55 Syncope and collapse: Secondary | ICD-10-CM

## 2024-03-08 MED ORDER — APIXABAN 5 MG PO TABS: 5.0000 mg | ORAL_TABLET | Freq: Two times a day (BID) | ORAL | 3 refills | Status: AC

## 2024-03-08 NOTE — Patient Instructions (Signed)
 Medication Instructions:  Your physician has recommended you make the following change in your medication:   START: Eliquis  5 mg two times daily  *If you need a refill on your cardiac medications before your next appointment, please call your pharmacy*  Lab Work: None If you have labs (blood work) drawn today and your tests are completely normal, you will receive your results only by: MyChart Message (if you have MyChart) OR A paper copy in the mail If you have any lab test that is abnormal or we need to change your treatment, we will call you to review the results.  Testing/Procedures: None  Follow-Up: At [facility], you and your health needs are our priority.  As part of our continuing mission to provide you with exceptional heart care, our providers are all part of one team.  This team includes your primary Cardiologist (physician) and Advanced Practice Providers or APPs (Physician Assistants and Nurse Practitioners) who all work together to provide you with the care you need, when you need it.  Your next appointment:   3 month(s)  Provider:   Redell Leiter, MD    We recommend signing up for the patient portal called MyChart.  Sign up information is provided on this After Visit Summary.  MyChart is used to connect with patients for Virtual Visits (Telemedicine).  Patients are able to view lab/test results, encounter notes, upcoming appointments, etc.  Non-urgent messages can be sent to your provider as well.   To learn more about what you can do with MyChart, go to [email or web address].   Other Instructions None

## 2024-03-22 ENCOUNTER — Other Ambulatory Visit: Payer: Self-pay | Admitting: Family Medicine

## 2024-04-05 ENCOUNTER — Ambulatory Visit: Admitting: Cardiology

## 2024-04-10 ENCOUNTER — Encounter (HOSPITAL_COMMUNITY): Payer: Self-pay | Admitting: Family Medicine

## 2024-04-10 ENCOUNTER — Emergency Department (HOSPITAL_COMMUNITY)

## 2024-04-10 ENCOUNTER — Observation Stay (HOSPITAL_COMMUNITY)
Admission: EM | Admit: 2024-04-10 | Discharge: 2024-04-11 | Disposition: A | Attending: Internal Medicine | Admitting: Internal Medicine

## 2024-04-10 ENCOUNTER — Other Ambulatory Visit: Payer: Self-pay

## 2024-04-10 DIAGNOSIS — Z96642 Presence of left artificial hip joint: Secondary | ICD-10-CM | POA: Diagnosis not present

## 2024-04-10 DIAGNOSIS — I480 Paroxysmal atrial fibrillation: Secondary | ICD-10-CM | POA: Diagnosis not present

## 2024-04-10 DIAGNOSIS — E1151 Type 2 diabetes mellitus with diabetic peripheral angiopathy without gangrene: Secondary | ICD-10-CM | POA: Insufficient documentation

## 2024-04-10 DIAGNOSIS — Z7901 Long term (current) use of anticoagulants: Secondary | ICD-10-CM | POA: Insufficient documentation

## 2024-04-10 DIAGNOSIS — M5442 Lumbago with sciatica, left side: Secondary | ICD-10-CM | POA: Insufficient documentation

## 2024-04-10 DIAGNOSIS — F17210 Nicotine dependence, cigarettes, uncomplicated: Secondary | ICD-10-CM | POA: Diagnosis not present

## 2024-04-10 DIAGNOSIS — E1140 Type 2 diabetes mellitus with diabetic neuropathy, unspecified: Secondary | ICD-10-CM | POA: Insufficient documentation

## 2024-04-10 DIAGNOSIS — E119 Type 2 diabetes mellitus without complications: Secondary | ICD-10-CM

## 2024-04-10 DIAGNOSIS — I739 Peripheral vascular disease, unspecified: Secondary | ICD-10-CM | POA: Diagnosis not present

## 2024-04-10 DIAGNOSIS — J449 Chronic obstructive pulmonary disease, unspecified: Secondary | ICD-10-CM | POA: Diagnosis not present

## 2024-04-10 DIAGNOSIS — M5136 Other intervertebral disc degeneration, lumbar region with discogenic back pain only: Secondary | ICD-10-CM | POA: Insufficient documentation

## 2024-04-10 DIAGNOSIS — Z79899 Other long term (current) drug therapy: Secondary | ICD-10-CM | POA: Diagnosis not present

## 2024-04-10 DIAGNOSIS — Z794 Long term (current) use of insulin: Secondary | ICD-10-CM | POA: Diagnosis not present

## 2024-04-10 DIAGNOSIS — I6782 Cerebral ischemia: Secondary | ICD-10-CM | POA: Insufficient documentation

## 2024-04-10 DIAGNOSIS — E162 Hypoglycemia, unspecified: Principal | ICD-10-CM | POA: Diagnosis present

## 2024-04-10 DIAGNOSIS — E11649 Type 2 diabetes mellitus with hypoglycemia without coma: Secondary | ICD-10-CM | POA: Diagnosis present

## 2024-04-10 DIAGNOSIS — E1149 Type 2 diabetes mellitus with other diabetic neurological complication: Secondary | ICD-10-CM | POA: Insufficient documentation

## 2024-04-10 DIAGNOSIS — Z8673 Personal history of transient ischemic attack (TIA), and cerebral infarction without residual deficits: Secondary | ICD-10-CM | POA: Insufficient documentation

## 2024-04-10 LAB — COMPREHENSIVE METABOLIC PANEL WITH GFR
ALT: 16 U/L (ref 0–44)
AST: 23 U/L (ref 15–41)
Albumin: 3.2 g/dL — ABNORMAL LOW (ref 3.5–5.0)
Alkaline Phosphatase: 58 U/L (ref 38–126)
Anion gap: 7 (ref 5–15)
BUN: 10 mg/dL (ref 8–23)
CO2: 26 mmol/L (ref 22–32)
Calcium: 8.6 mg/dL — ABNORMAL LOW (ref 8.9–10.3)
Chloride: 96 mmol/L — ABNORMAL LOW (ref 98–111)
Creatinine, Ser: 0.89 mg/dL (ref 0.61–1.24)
GFR, Estimated: >60 mL/min (ref >60)
Glucose, Bld: 34 mg/dL — CL (ref 70–99)
Potassium: 3.6 mmol/L (ref 3.5–5.1)
Sodium: 129 mmol/L — ABNORMAL LOW (ref 135–145)
Total Bilirubin: 0.6 mg/dL (ref 0.0–1.2)
Total Protein: 6 g/dL — ABNORMAL LOW (ref 6.5–8.1)

## 2024-04-10 LAB — CBC WITH DIFFERENTIAL/PLATELET
Abs Immature Granulocytes: 0.15 K/uL — ABNORMAL HIGH (ref 0.00–0.07)
Basophils Absolute: 0.1 K/uL (ref 0.0–0.1)
Basophils Relative: 1 %
Eosinophils Absolute: 0.1 K/uL (ref 0.0–0.5)
Eosinophils Relative: 1 %
HCT: 32.7 % — ABNORMAL LOW (ref 39.0–52.0)
Hemoglobin: 11.4 g/dL — ABNORMAL LOW (ref 13.0–17.0)
Immature Granulocytes: 1 %
Lymphocytes Relative: 11 %
Lymphs Abs: 1.4 K/uL (ref 0.7–4.0)
MCH: 33.6 pg (ref 26.0–34.0)
MCHC: 34.9 g/dL (ref 30.0–36.0)
MCV: 96.5 fL (ref 80.0–100.0)
Monocytes Absolute: 1.2 K/uL — ABNORMAL HIGH (ref 0.1–1.0)
Monocytes Relative: 8 %
Neutro Abs: 10.9 K/uL — ABNORMAL HIGH (ref 1.7–7.7)
Neutrophils Relative %: 78 %
Platelets: 272 K/uL (ref 150–400)
RBC: 3.39 MIL/uL — ABNORMAL LOW (ref 4.22–5.81)
RDW: 12 % (ref 11.5–15.5)
WBC: 13.8 K/uL — ABNORMAL HIGH (ref 4.0–10.5)
nRBC: 0 % (ref 0.0–0.2)

## 2024-04-10 LAB — I-STAT CHEM 8, ED
BUN: 8 mg/dL (ref 8–23)
Calcium, Ion: 1.15 mmol/L (ref 1.15–1.40)
Chloride: 93 mmol/L — ABNORMAL LOW (ref 98–111)
Creatinine, Ser: 1 mg/dL (ref 0.61–1.24)
Glucose, Bld: 141 mg/dL — ABNORMAL HIGH (ref 70–99)
HCT: 33 % — ABNORMAL LOW (ref 39.0–52.0)
Hemoglobin: 11.2 g/dL — ABNORMAL LOW (ref 13.0–17.0)
Potassium: 3.6 mmol/L (ref 3.5–5.1)
Sodium: 132 mmol/L — ABNORMAL LOW (ref 135–145)
TCO2: 26 mmol/L (ref 22–32)

## 2024-04-10 LAB — URINALYSIS, ROUTINE W REFLEX MICROSCOPIC
Bacteria, UA: NONE SEEN
Bilirubin Urine: NEGATIVE
Glucose, UA: 150 mg/dL — AB
Ketones, ur: NEGATIVE mg/dL
Leukocytes,Ua: NEGATIVE
Nitrite: NEGATIVE
Protein, ur: 100 mg/dL — AB
Specific Gravity, Urine: 1.006 (ref 1.005–1.030)
pH: 7 (ref 5.0–8.0)

## 2024-04-10 LAB — CBG MONITORING, ED
Glucose-Capillary: 116 mg/dL — ABNORMAL HIGH (ref 70–99)
Glucose-Capillary: 27 mg/dL — CL (ref 70–99)
Glucose-Capillary: 140 mg/dL — ABNORMAL HIGH (ref 70–99)
Glucose-Capillary: 91 mg/dL (ref 70–99)
Glucose-Capillary: 239 mg/dL — ABNORMAL HIGH (ref 70–99)

## 2024-04-10 LAB — ETHANOL: Alcohol, Ethyl (B): <15 mg/dL (ref <15)

## 2024-04-10 LAB — TROPONIN I (HIGH SENSITIVITY)
Troponin I (High Sensitivity): 7 ng/L (ref <18)
Troponin I (High Sensitivity): 8 ng/L (ref <18)

## 2024-04-10 MED ORDER — SODIUM CHLORIDE 0.9% FLUSH: 3.0000 mL | INTRAVENOUS | Status: DC | PRN

## 2024-04-10 MED ORDER — DEXTROSE-SODIUM CHLORIDE 10-0.45 % IV SOLN
INTRAVENOUS | Status: DC
  Filled 2024-04-10: qty 1000

## 2024-04-10 MED ORDER — FENTANYL CITRATE (PF) 50 MCG/ML IJ SOSY
12.5000 ug | PREFILLED_SYRINGE | INTRAMUSCULAR | Status: DC | PRN
  Administered 2024-04-10 – 2024-04-11 (×2): 50 ug via INTRAVENOUS
  Filled 2024-04-10 (×2): qty 1

## 2024-04-10 MED ORDER — PANTOPRAZOLE SODIUM 40 MG PO TBEC
40.0000 mg | DELAYED_RELEASE_TABLET | Freq: Two times a day (BID) | ORAL | Status: DC
  Administered 2024-04-10 – 2024-04-11 (×2): 40 mg via ORAL
  Filled 2024-04-10 (×2): qty 1

## 2024-04-10 MED ORDER — ATORVASTATIN CALCIUM 80 MG PO TABS
80.0000 mg | ORAL_TABLET | Freq: Every day | ORAL | Status: DC
  Administered 2024-04-11: 80 mg via ORAL
  Filled 2024-04-10: qty 1

## 2024-04-10 MED ORDER — SENNOSIDES-DOCUSATE SODIUM 8.6-50 MG PO TABS: 1.0000 | ORAL_TABLET | Freq: Every evening | ORAL | Status: DC | PRN

## 2024-04-10 MED ORDER — SODIUM CHLORIDE 0.9% FLUSH
3.0000 mL | Freq: Two times a day (BID) | INTRAVENOUS | Status: DC
  Administered 2024-04-10: 3 mL via INTRAVENOUS

## 2024-04-10 MED ORDER — ACETAMINOPHEN 325 MG PO TABS: 650.0000 mg | ORAL_TABLET | Freq: Four times a day (QID) | ORAL | Status: DC | PRN

## 2024-04-10 MED ORDER — FENTANYL CITRATE (PF) 50 MCG/ML IJ SOSY
50.0000 ug | PREFILLED_SYRINGE | Freq: Once | INTRAMUSCULAR | Status: AC
  Administered 2024-04-10: 50 ug via INTRAVENOUS
  Filled 2024-04-10: qty 1

## 2024-04-10 MED ORDER — PREGABALIN 100 MG PO CAPS
100.0000 mg | ORAL_CAPSULE | Freq: Two times a day (BID) | ORAL | Status: DC
  Administered 2024-04-10 – 2024-04-11 (×2): 100 mg via ORAL
  Filled 2024-04-10 (×2): qty 1

## 2024-04-10 MED ORDER — DULOXETINE HCL 60 MG PO CPEP
60.0000 mg | ORAL_CAPSULE | Freq: Every day | ORAL | Status: DC
  Administered 2024-04-11: 60 mg via ORAL
  Filled 2024-04-10: qty 1

## 2024-04-10 MED ORDER — SODIUM CHLORIDE 0.9 % IV SOLN
250.0000 mL | INTRAVENOUS | Status: DC | PRN
  Administered 2024-04-10: 250 mL via INTRAVENOUS

## 2024-04-10 MED ORDER — DEXTROSE-SODIUM CHLORIDE 10-0.45 % IV SOLN
INTRAVENOUS | Status: DC
  Filled 2024-04-10 (×2): qty 1000

## 2024-04-10 MED ORDER — PROCHLORPERAZINE EDISYLATE 10 MG/2ML IJ SOLN: 5.0000 mg | Freq: Four times a day (QID) | INTRAMUSCULAR | Status: DC | PRN

## 2024-04-10 MED ORDER — METHOCARBAMOL 1000 MG/10ML IJ SOLN
500.0000 mg | Freq: Four times a day (QID) | INTRAMUSCULAR | Status: DC | PRN
  Administered 2024-04-10 – 2024-04-11 (×2): 500 mg via INTRAVENOUS
  Filled 2024-04-10 (×2): qty 10

## 2024-04-10 MED ORDER — DEXTROSE 50 % IV SOLN
50.0000 mL | Freq: Once | INTRAVENOUS | Status: AC
  Administered 2024-04-10: 50 mL via INTRAVENOUS
  Filled 2024-04-10: qty 50

## 2024-04-10 MED ORDER — OXYCODONE HCL 5 MG PO TABS
5.0000 mg | ORAL_TABLET | ORAL | Status: DC | PRN
  Administered 2024-04-10 – 2024-04-11 (×2): 5 mg via ORAL
  Filled 2024-04-10 (×2): qty 1

## 2024-04-10 MED ORDER — HYDRALAZINE HCL 25 MG PO TABS
25.0000 mg | ORAL_TABLET | Freq: Four times a day (QID) | ORAL | Status: DC | PRN
  Filled 2024-04-10: qty 1

## 2024-04-10 MED ORDER — INSULIN ASPART 100 UNIT/ML IJ SOLN
0.0000 [IU] | INTRAMUSCULAR | Status: DC
  Administered 2024-04-10 – 2024-04-11 (×2): 1 [IU] via SUBCUTANEOUS
  Filled 2024-04-10 (×2): qty 1

## 2024-04-10 MED ORDER — ACETAMINOPHEN 650 MG RE SUPP: 650.0000 mg | Freq: Four times a day (QID) | RECTAL | Status: DC | PRN

## 2024-04-10 MED ORDER — IPRATROPIUM-ALBUTEROL 0.5-2.5 (3) MG/3ML IN SOLN: 3.0000 mL | Freq: Four times a day (QID) | RESPIRATORY_TRACT | Status: DC | PRN

## 2024-04-10 MED ORDER — ALFUZOSIN HCL ER 10 MG PO TB24
10.0000 mg | ORAL_TABLET | Freq: Every day | ORAL | Status: DC
  Administered 2024-04-11: 10 mg via ORAL
  Filled 2024-04-10 (×2): qty 1

## 2024-04-10 MED ORDER — APIXABAN 5 MG PO TABS
5.0000 mg | ORAL_TABLET | Freq: Two times a day (BID) | ORAL | Status: DC
  Administered 2024-04-10 – 2024-04-11 (×2): 5 mg via ORAL
  Filled 2024-04-10: qty 2

## 2024-04-10 NOTE — ED Notes (Signed)
 CBG now 140

## 2024-04-10 NOTE — ED Notes (Signed)
 ED Tech took a CBG and it resulted as 27; EDP notified and put in orders

## 2024-04-10 NOTE — ED Notes (Signed)
 EDP at [facility]

## 2024-04-10 NOTE — ED Triage Notes (Signed)
 Pt. BIB [facility] from home with c/o hypoglycemia that occurred this morning; Per [facility], the fire department arrived and he was unresponsive, with an unreadable CBG due to it being so low; during transportation, [facility] gave him 2 bags of D10 and 1 glucose tab, and that brought his sugar up to 164, but [facility] said that it would drop right when the D10 finished. The pt. Also c/o back and leg pain. Pt. Is on Eliquis . Pt. Has a 18G in L AC  VS per [facility]:  BP: 190/88 HR: 85 SpO2: 98%

## 2024-04-10 NOTE — H&P (Signed)
 History and Physical    Gabriel Gomez FMW:[id number] DOB: [birth year] DOA: 04/10/2024  PCP: Silver Lamar LABOR, MD   Patient coming from: Home   Chief Complaint: Unresponsive, low glucose   HPI: Gabriel Gomez is a 63 y.o. male with medical history significant for type 2 diabetes mellitus, COPD, PAF on Eliquis , history of CVA, nonobstructive CAD, and PAD who presents for evaluation of hypoglycemia.  Patient reports that he took 20 units of Lantus  last night, ate some crackers and a piece of cake, had CBG of 91 this morning, ate some cereal and a banana at approximately 9 AM, and was then found unresponsive by his wife at approximately 3 PM.  EMS was called, he was found to be severely hypoglycemic, and he was treated with IV dextrose  and then oral dextrose  prior to arrival in the ED.  Patient reports that he did not eat much last night or this morning but states that that is typical for him.  He does not believe that he could have given himself more insulin  than intended last night.  He does not take any oral diabetes medications and denies starting any new medications recently.  He was feeling normal this morning and early afternoon prior to the event.  He has increase in his chronic low back pain as well as pain in the left hip and ankle, but no other new symptoms.  ED Course: Upon arrival to the ED, patient is found to be afebrile and saturating well on room air with normal HR and elevated BP.  Labs are most notable for serum glucose 34 and WBC 13,800.  There are no acute findings on head CT, chest x-ray, or plain films of the pelvis, left femur, left ankle, and lumbar spine.  He was treated with IV dextrose  and fentanyl  in the ED.  Review of Systems:  All other systems reviewed and apart from HPI, are negative.  Past Medical History:  Diagnosis Date   Abnormal laboratory test 11/15/2019   Accelerated idioventricular rhythm 10/02/2023   Acute laryngopharyngitis 05/21/2020   Acute  left-sided low back pain with left-sided sciatica 11/06/2019   Acute pain of right shoulder 09/25/2022   Alcohol use 11/17/2023   Arthritis    Asymmetric SNHL (sensorineural hearing loss) 02/25/2022   Asymptomatic microscopic hematuria 11/06/2019   Atherosclerosis of coronary artery 11/17/2023   Balance problem 07/06/2023   Body mass index (BMI) 27.0-27.9, adult 08/02/2020   Bulging lumbar disc 03/07/2020   Chest pain of uncertain etiology 11/17/2023   Chronic health problem 10/02/2023   Chronic pain 01/07/2023   COPD (chronic obstructive pulmonary disease) (HCC)    Coronary artery disease    Diabetes mellitus without complication (HCC)    Dysfunction of both eustachian tubes 04/23/2022   Dysuria 04/27/2022   Essential hypertension, benign 08/03/2019   Gait disturbance 07/06/2023   GERD (gastroesophageal reflux disease)    Hypertension associated with diabetes (HCC) 10/02/2023   Hypokalemia 10/02/2023   Hypomagnesemia 06/29/2023   Hyponatremia 05/29/2018   Left ankle sprain 10/02/2023   Left pontine CVA (HCC) 04/23/2022   Chronic finding     Leukocytosis 05/29/2018   Long term current use of insulin  (HCC) 11/17/2023   Low back pain with sciatica 06/10/2020   Lumbar radiculopathy 03/07/2020   Mixed dyslipidemia 04/21/2022   Mixed hyperlipidemia 08/03/2019   Nausea and vomiting 11/17/2023   Need for tetanus, diphtheria, and acellular pertussis (Tdap) vaccine 11/06/2019   Orthostatic syncope 11/25/2023   Osteonecrosis, unspecified (HCC) 11/17/2023  PAD (peripheral artery disease) 10/01/2022   Paroxysmal atrial fibrillation (HCC) 11/17/2023   Peripheral polyneuropathy 07/01/2020   Polyneuropathy due to type 2 diabetes mellitus (HCC) 04/21/2022   Prostate cancer screening 11/15/2019   Respiratory infection 03/11/2022   Rib pain on right side 05/11/2022   Right flank pain 04/21/2022   Right knee meniscal tear 10/21/2021   Right leg weakness 07/06/2023   Syncope and collapse  10/02/2023   Tobacco user 11/17/2023   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/02/2023   Type 2 diabetes mellitus without complication, with long-term current use of insulin  (HCC) 08/03/2019   Unstable angina (HCC) 05/28/2018   Urinary urgency 04/27/2022   Vitamin D  deficiency 06/29/2023    Past Surgical History:  Procedure Laterality Date   CHONDROPLASTY Right 12/15/2021   Procedure: MEDIAL COMPARTMENT CHONDROPLASTY;  Surgeon: Barbarann Oneil BROCKS, MD;  Location: [facility];  Service: Orthopedics;  Laterality: Right;   JOINT REPLACEMENT Left    KNEE ARTHROSCOPY Right 12/15/2021   Procedure: RIGHT KNEE ARTHROSCOPY;  Surgeon: Barbarann Oneil BROCKS, MD;  Location: [facility];  Service: Orthopedics;  Laterality: Right;   LEFT HEART CATH AND CORONARY ANGIOGRAPHY N/A 05/30/2018   Procedure: LEFT HEART CATH AND CORONARY ANGIOGRAPHY;  Surgeon: Wonda Sharper, MD;  Location: [facility];  Service: Cardiovascular;  Laterality: N/A;   liver laceration  1979    Social History:   reports that he has been smoking cigarettes. He has never used smokeless tobacco. He reports current alcohol use. He reports that he does not use drugs.  Allergies  Allergen Reactions   Meloxicam  Swelling    Both legs   Morphine  Other (See Comments)    Causes severe confusion and aggression   Tape Other (See Comments)    Not an allergy, daughter wants it added because it tears his skin    Family History  Problem Relation Age of Onset   CAD Father      Prior to Admission medications   Medication Sig Start Date End Date Taking? Authorizing Provider  ACCU-CHEK GUIDE test strip USE ONE strip FOUR TIMES DAILY 06/24/22   Nicholaus Credit, PA-C  Accu-Chek Softclix Lancets lancets Use as instructed 06/25/21   Nicholaus Credit, PA-C  albuterol  (VENTOLIN  HFA) 108 (90 Base) MCG/ACT inhaler Inhale 2 puffs into the lungs every 6 (six) hours as needed for wheezing or shortness of breath.  09/23/22   CoxAbigail, MD  alfuzosin (UROXATRAL) 10 MG 24 hr tablet Take 10 mg by mouth daily with breakfast. 12/14/23   [provider]  apixaban  (ELIQUIS ) 5 MG TABS tablet Take 1 tablet (5 mg total) by mouth 2 (two) times daily. 03/08/24   Monetta Redell PARAS, MD  atorvastatin  (LIPITOR ) 80 MG tablet TAKE ONE TABLET BY MOUTH ONCE DAILY AT 6pm 12/04/21   Nicholaus Credit, PA-C  bethanechol (URECHOLINE) 50 MG tablet Take 50 mg by mouth 2 (two) times daily. 09/08/23   [provider]  Blood Glucose Monitoring Suppl (ACCU-CHEK GUIDE) w/Device KIT To use to check glucose qd 06/16/21   Nicholaus Credit, PA-C  Cetirizine-Pseudoephedrine (ZYRTEC-D ALLERGY & SINUS PO) Take 1 tablet by mouth daily. 05/11/21   [provider]  DULoxetine  (CYMBALTA ) 60 MG capsule Take 60 mg by mouth daily. 08/04/21   [provider]  LANTUS  SOLOSTAR 100 UNIT/ML Solostar Pen Inject 30 Units into the skin at bedtime. 10/19/21   Cox, Abigail, MD  nitroGLYCERIN  (NITROSTAT ) 0.4 MG SL tablet Place 1 tablet (  0.4 mg total) under the tongue every 5 (five) minutes x 3 doses as needed for chest pain. 10/07/23   Emelia Josefa HERO, NP  NOVOLOG  FLEXPEN 100 UNIT/ML FlexPen inject 15 units EVERY MORNING, 10 units AT LUNCH AND 10 units EVERY EVENING 02/10/21   Davis, Sara, PA-C  pantoprazole  (PROTONIX ) 40 MG tablet Take 40 mg by mouth 2 (two) times daily.    [provider]  pregabalin  (LYRICA ) 100 MG capsule Take 100 mg by mouth 2 (two) times daily.    [provider]  SURE COMFORT PEN NEEDLES 32G X 4 MM MISC AS DIRECTED FOUR TIMES DAILY 01/25/23   Sherre Clapper, MD    Physical Exam: Vitals:   04/10/24 1634 04/10/24 1857 04/10/24 1914  BP: (!) 192/86 (!) 195/85   Pulse: 82 85   Resp:  16   Temp: 97.8 F (36.6 C) (!) 97.3 F (36.3 C)   TempSrc: Oral Oral   SpO2: 100% 100% 100%    Constitutional: NAD, no pallor or diaphoresis   Eyes: PERTLA, lids and conjunctivae normal ENMT: Mucous membranes are moist.  Posterior pharynx clear of any exudate or lesions.   Neck: supple, no masses  Respiratory: no wheezing, no crackles. No accessory muscle use.  Cardiovascular: S1 & S2 heard, regular rate and rhythm. No extremity edema.  Abdomen: No tenderness, soft. Bowel sounds active.  Musculoskeletal: no clubbing / cyanosis. No joint deformity upper and lower extremities.   Skin: no significant rashes, lesions, ulcers. Warm, dry, well-perfused. Neurologic: CN 2-12 grossly intact. Moving all extremities. Alert and oriented.  Psychiatric: Calm. Cooperative.    Labs and Imaging on Admission: I have personally reviewed following labs and imaging studies  CBC: Recent Labs  Lab 04/10/24 1712 04/10/24 1724  WBC 13.8*  --   NEUTROABS 10.9*  --   HGB 11.4* 11.2*  HCT 32.7* 33.0*  MCV 96.5  --   PLT 272  --    Basic Metabolic Panel: Recent Labs  Lab 04/10/24 1712 04/10/24 1724  NA 129* 132*  K 3.6 3.6  CL 96* 93*  CO2 26  --   GLUCOSE 34* 141*  BUN 10 8  CREATININE 0.89 1.00  CALCIUM  8.6*  --    GFR: CrCl cannot be calculated (Unknown ideal weight.). Liver Function Tests: Recent Labs  Lab 04/10/24 1712  AST 23  ALT 16  ALKPHOS 58  BILITOT 0.6  PROT 6.0*  ALBUMIN 3.2*   No results for input(s): LIPASE, AMYLASE in the last 168 hours. No results for input(s): AMMONIA in the last 168 hours. Coagulation Profile: No results for input(s): INR, PROTIME in the last 168 hours. Cardiac Enzymes: No results for input(s): CKTOTAL, CKMB, CKMBINDEX, TROPONINI in the last 168 hours. BNP (last 3 results) No results for input(s): PROBNP in the last 8760 hours. HbA1C: No results for input(s): HGBA1C in the last 72 hours. CBG: Recent Labs  Lab 04/10/24 1627 04/10/24 1708 04/10/24 1735 04/10/24 1827 04/10/24 2021  GLUCAP 116* 27* 91 140* 239*   Lipid Profile: No results for input(s): CHOL, HDL, LDLCALC, TRIG, CHOLHDL, LDLDIRECT in the last 72  hours. Thyroid  Function Tests: No results for input(s): TSH, T4TOTAL, FREET4, T3FREE, THYROIDAB in the last 72 hours. Anemia Panel: No results for input(s): VITAMINB12, FOLATE, FERRITIN, TIBC, IRON, RETICCTPCT in the last 72 hours. Urine analysis:    Component Value Date/Time   COLORURINE STRAW (A) 04/10/2024 1716   APPEARANCEUR CLEAR 04/10/2024 1716   LABSPEC 1.006 04/10/2024 1716  PHURINE 7.0 04/10/2024 1716   GLUCOSEU 150 (A) 04/10/2024 1716   HGBUR SMALL (A) 04/10/2024 1716   BILIRUBINUR NEGATIVE 04/10/2024 1716   BILIRUBINUR Negative 11/06/2019 0858   KETONESUR NEGATIVE 04/10/2024 1716   PROTEINUR 100 (A) 04/10/2024 1716   UROBILINOGEN negative (A) 11/06/2019 0858   NITRITE NEGATIVE 04/10/2024 1716   LEUKOCYTESUR NEGATIVE 04/10/2024 1716   Sepsis Labs: @LABRCNTIP (procalcitonin:4,lacticidven:4) )No results found for this or any previous visit (from the past 240 hours).   Radiological Exams on Admission: DG Ankle Complete Left Result Date: 04/10/2024 EXAM: 3 OR MORE VIEW(S) XRAY OF THE LEFT ANKLE 04/10/2024 07:26:00 PM CLINICAL HISTORY: fall COMPARISON: None available. FINDINGS: BONES AND JOINTS: Status post partial resection of the fibula. No acute fracture or dislocation. SOFT TISSUES: The soft tissues are unremarkable. IMPRESSION: 1. No acute fracture or dislocation. Electronically signed by: Norman Gatlin MD 04/10/2024 08:02 PM EST RP Workstation: [id number]   DG Chest 1 View Result Date: 04/10/2024 CLINICAL DATA:  Status post fall.  Leg and back pain.  Hypoglycemia. EXAM: CHEST  1 VIEW COMPARISON:  Chest radiograph 06/28/2013 FINDINGS: The cardiomediastinal contours are normal. Mild atelectasis/scarring at the left lung base. Pulmonary vasculature is normal. No consolidation, pleural effusion, or pneumothorax. No acute osseous abnormalities are seen. IMPRESSION: No active disease. Electronically Signed   By: Andrea Gasman M.D.   On: 04/10/2024 19:02    DG Pelvis 1-2 Views Result Date: 04/10/2024 CLINICAL DATA:  Status post fall with leg and back pain. EXAM: PELVIS - 1-2 VIEW COMPARISON:  None Available. FINDINGS: Left hip arthroplasty is intact were visualized. No evidence of acute pelvic fracture. No pubic symphyseal or sacroiliac diastasis. The pubic rami are intact. No focal bone abnormality. Surgical clips project over the left greater trochanter. IMPRESSION: No acute fracture of the pelvis. Left hip arthroplasty without complication. Electronically Signed   By: Andrea Gasman M.D.   On: 04/10/2024 19:01   DG Femur Min 2 Views Left Result Date: 04/10/2024 CLINICAL DATA:  Status post fall with leg pain. EXAM: LEFT FEMUR 2 VIEWS COMPARISON:  None Available. FINDINGS: Left hip arthroplasty in place. Portions of the screw seen adjacent to the distal femoral stem. No convincing periprosthetic fracture or lucency. Lucencies extending through cortical thickening about the medial upper femur are felt to represent nutrient channels. Knee alignment is maintained. Multiple surgical clips project over the greater trochanter and laterally. IMPRESSION: No acute femur fracture. Left hip arthroplasty in expected alignment. Electronically Signed   By: Andrea Gasman M.D.   On: 04/10/2024 19:00   DG Lumbar Spine Complete Result Date: 04/10/2024 CLINICAL DATA:  Status post fall with back and leg pain. EXAM: LUMBAR SPINE - COMPLETE 4+ VIEW COMPARISON:  None Available. FINDINGS: Five non-rib-bearing lumbar vertebra. No acute fracture or compression deformity. Mild straightening of normal lordosis without listhesis. Mild multilevel degenerative disc disease with disc space narrowing and spurring. Mild L5-S1 facet hypertrophy. Aortic atherosclerosis. IMPRESSION: 1. No acute fracture or subluxation of the lumbar spine. 2. Mild multilevel degenerative disc disease and facet hypertrophy. Electronically Signed   By: Andrea Gasman M.D.   On: 04/10/2024 18:50   CT Head  Wo Contrast Result Date: 04/10/2024 CLINICAL DATA:  Provided history: Head trauma, minor (Age >= 65y) EXAM: CT HEAD WITHOUT CONTRAST TECHNIQUE: Contiguous axial images were obtained from the base of the skull through the vertex without intravenous contrast. RADIATION DOSE REDUCTION: This exam was performed according to the departmental dose-optimization program which includes automated exposure control, adjustment of the  mA and/or kV according to patient size and/or use of iterative reconstruction technique. COMPARISON:  Head CT 10/02/2023 FINDINGS: Brain: No intracranial hemorrhage, mass effect, or midline shift. No hydrocephalus. The basilar cisterns are patent. Right lacunar infarct in the right thalamus, unchanged. Mild periventricular chronic small vessel ischemia no evidence of territorial infarct or acute ischemia. No extra-axial or intracranial fluid collection. Vascular: Atherosclerosis of skullbase vasculature without hyperdense vessel or abnormal calcification. Skull: No fracture or focal lesion. Sinuses/Orbits: Scattered mucosal thickening of the paranasal sinuses. Partially included left maxillary sinus mucous retention cyst. No mastoid effusion. Other: None. IMPRESSION: 1. No acute intracranial abnormality. No skull fracture. 2. Chronic small vessel ischemia. Remote right thalamic lacunar infarct. Electronically Signed   By: Andrea Gasman M.D.   On: 04/10/2024 18:46    EKG: Independently reviewed. Sinus rhythm.   Assessment/Plan   1. Hypoglycemia; type II DM  - Found unresponsive at home with severe hypoglycemia, treated with IV and oral dextrose  prior to arrival in ED where he has had recurrent hypoglycemia as low as 27  - He reports taking 20 units of Lantus  last night, did not eat much last night or this am but states that is typical for him  - Continue frequent CBGs, treat with dextrose  as-needed, start very low-intensity sliding-scale correctional as he improves and then escalate as  needed   2. Acute on chronic back and left leg pain  - No fractures or dislocations on imaging  - Continue pain-control, supportive care    3. PAF  - Scheduled for Watchman procedure next month  - Continue Eliquis   4. Hx of CVA   - Continue Eliquis  and Lipitor     5. COPD  - Not in exacerbation   6. PAD  - No acute ischemia  - Continue Lipitor  and Eliquis     DVT prophylaxis: Eliquis   Code Status: Full  Level of Care: Level of care: Progressive Family Communication: Daughter and wife at bedside  Disposition Plan:  Patient is from: Home  Anticipated d/c is to: Home  Anticipated d/c date is: 12/2 or 04/12/24  Patient currently: Pending stable glucose Consults called: None  Admission status: Observation     Evalene GORMAN Sprinkles, MD [facility]  04/10/2024, 8:34 PM

## 2024-04-10 NOTE — Plan of Care (Signed)

## 2024-04-10 NOTE — ED Notes (Signed)
 Checked pt. CBG and it was 116

## 2024-04-10 NOTE — ED Provider Notes (Signed)
  EMERGENCY DEPARTMENT AT [facility] Provider Note   CSN: [id number] Arrival date & time: 04/10/24  1622     Patient presents with: Hypoglycemia   Gabriel Gomez is a 63 y.o. male.   HPI 63 year old male with a history of insulin -dependent diabetes presents with hypoglycemia.  Patient members waking up and his glucose was low so he did not take his insulin  today.  He ate cereal and banana for breakfast and then was going outside and then does not remember what happened after that.  Was reportedly unresponsive and required multiple treatments for repeated hypoglycemia with the fire department and EMS.  Patient is having low back pain and left leg/thigh pain which is new since he woke up.  He does have chronic back pain but he is not sure if he fell and injured it.  He is on Eliquis  but does not remember any head injury or headache.  He feels like there is a little bit of numbness to his left foot.  When family arrived they noted that he was laying on his left side when he was found by family.  Unclear how long he was laying there.  He otherwise has been feeling fine prior to the episodes today.  As I was talking to him he started to feel hot and feel like his blood sugar was going back down and it was found to be in the 20s on CBG and he was given a D50 bolus.  Prior to Admission medications   Medication Sig Start Date End Date Taking? Authorizing Provider  ACCU-CHEK GUIDE test strip USE ONE strip FOUR TIMES DAILY 06/24/22   Nicholaus Credit, PA-C  Accu-Chek Softclix Lancets lancets Use as instructed 06/25/21   Nicholaus Credit, PA-C  albuterol  (VENTOLIN  HFA) 108 (90 Base) MCG/ACT inhaler Inhale 2 puffs into the lungs every 6 (six) hours as needed for wheezing or shortness of breath. 09/23/22   CoxAbigail, MD  alfuzosin (UROXATRAL) 10 MG 24 hr tablet Take 10 mg by mouth daily with breakfast. 12/14/23   [provider]  apixaban  (ELIQUIS ) 5 MG TABS tablet Take 1 tablet (5 mg  total) by mouth 2 (two) times daily. 03/08/24   Monetta Redell PARAS, MD  atorvastatin  (LIPITOR ) 80 MG tablet TAKE ONE TABLET BY MOUTH ONCE DAILY AT 6pm 12/04/21   Nicholaus Credit, PA-C  bethanechol (URECHOLINE) 50 MG tablet Take 50 mg by mouth 2 (two) times daily. 09/08/23   [provider]  Blood Glucose Monitoring Suppl (ACCU-CHEK GUIDE) w/Device KIT To use to check glucose qd 06/16/21   Nicholaus Credit, PA-C  Cetirizine-Pseudoephedrine (ZYRTEC-D ALLERGY & SINUS PO) Take 1 tablet by mouth daily. 05/11/21   [provider]  DULoxetine  (CYMBALTA ) 60 MG capsule Take 60 mg by mouth daily. 08/04/21   [provider]  LANTUS  SOLOSTAR 100 UNIT/ML Solostar Pen Inject 30 Units into the skin at bedtime. 10/19/21   Cox, Abigail, MD  nitroGLYCERIN  (NITROSTAT ) 0.4 MG SL tablet Place 1 tablet (0.4 mg total) under the tongue every 5 (five) minutes x 3 doses as needed for chest pain. 10/07/23   Emelia Josefa HERO, NP  NOVOLOG  FLEXPEN 100 UNIT/ML FlexPen inject 15 units EVERY MORNING, 10 units AT LUNCH AND 10 units EVERY EVENING 02/10/21   Davis, Sara, PA-C  pantoprazole  (PROTONIX ) 40 MG tablet Take 40 mg by mouth 2 (two) times daily.    [provider]  pregabalin  (LYRICA ) 100 MG capsule Take 100 mg by mouth 2 (two) times  daily.    [provider]  SURE COMFORT PEN NEEDLES 32G X 4 MM MISC AS DIRECTED FOUR TIMES DAILY 01/25/23   Cox, Abigail, MD    Allergies: Meloxicam , Morphine , and Tape    Review of Systems  Constitutional:  Negative for fever.  Respiratory:  Negative for shortness of breath.   Cardiovascular:  Negative for chest pain.  Gastrointestinal:  Negative for abdominal pain, diarrhea and vomiting.  Musculoskeletal:  Positive for arthralgias and back pain.  Neurological:  Negative for weakness, numbness and headaches.    Updated Vital Signs BP (!) 195/85 (BP Location: Right Arm)   Pulse 85   Temp (!) 97.3 F (36.3 C) (Oral)   Resp 16   SpO2 100%   Physical Exam Vitals  and nursing note reviewed.  Constitutional:      Appearance: He is well-developed.  HENT:     Head: Normocephalic and atraumatic.  Eyes:     Extraocular Movements: Extraocular movements intact.  Cardiovascular:     Rate and Rhythm: Normal rate and regular rhythm.     Pulses:          Dorsalis pedis pulses are detected w/ Doppler on the right side and detected w/ Doppler on the left side.     Heart sounds: Normal heart sounds.  Pulmonary:     Effort: Pulmonary effort is normal.     Breath sounds: Normal breath sounds.  Abdominal:     General: There is no distension.     Palpations: Abdomen is soft.     Tenderness: There is no abdominal tenderness.  Musculoskeletal:     Thoracic back: No tenderness.     Lumbar back: Tenderness present.     Left hip: Tenderness present. Decreased range of motion.     Left upper leg: Tenderness present. No deformity.     Left knee: No tenderness.     Left lower leg: No tenderness.     Left ankle: No deformity. Tenderness present. Decreased range of motion.     Left foot: No tenderness.  Skin:    General: Skin is warm and dry.  Neurological:     Mental Status: He is alert and oriented to person, place, and time.     Comments: Alert and oriented to person, place, time, situation.  Equal strength in all 4 extremities though a little limited in the legs due to pain that it causes in his back.     (all labs ordered are listed, but only abnormal results are displayed) Labs Reviewed  COMPREHENSIVE METABOLIC PANEL WITH GFR - Abnormal; Notable for the following components:      Result Value   Sodium 129 (*)    Chloride 96 (*)    Glucose, Bld 34 (*)    Calcium  8.6 (*)    Total Protein 6.0 (*)    Albumin 3.2 (*)    All other components within normal limits  URINALYSIS, ROUTINE W REFLEX MICROSCOPIC - Abnormal; Notable for the following components:   Color, Urine STRAW (*)    Glucose, UA 150 (*)    Hgb urine dipstick SMALL (*)    Protein, ur 100 (*)     All other components within normal limits  CBC WITH DIFFERENTIAL/PLATELET - Abnormal; Notable for the following components:   WBC 13.8 (*)    RBC 3.39 (*)    Hemoglobin 11.4 (*)    HCT 32.7 (*)    Neutro Abs 10.9 (*)    Monocytes Absolute 1.2 (*)  Abs Immature Granulocytes 0.15 (*)    All other components within normal limits  CBG MONITORING, ED - Abnormal; Notable for the following components:   Glucose-Capillary 116 (*)    All other components within normal limits  CBG MONITORING, ED - Abnormal; Notable for the following components:   Glucose-Capillary 27 (*)    All other components within normal limits  I-STAT CHEM 8, ED - Abnormal; Notable for the following components:   Sodium 132 (*)    Chloride 93 (*)    Glucose, Bld 141 (*)    Hemoglobin 11.2 (*)    HCT 33.0 (*)    All other components within normal limits  CBG MONITORING, ED - Abnormal; Notable for the following components:   Glucose-Capillary 140 (*)    All other components within normal limits  ETHANOL  HEMOGLOBIN A1C  BASIC METABOLIC PANEL WITH GFR  MAGNESIUM   CBC  CBG MONITORING, ED  TROPONIN I (HIGH SENSITIVITY)  TROPONIN I (HIGH SENSITIVITY)    EKG: EKG Interpretation Date/Time:  Monday April 10 2024 18:35:56 EST Ventricular Rate:  78 PR Interval:  207 QRS Duration:  111 QT Interval:  398 QTC Calculation: 454 R Axis:   -14  Text Interpretation: Sinus rhythm Probable anteroseptal infarct, old no significant change since July 2025 Confirmed by Freddi Hamilton [phone]) on 04/10/2024 6:40:17 PM  Radiology: [facility] Ankle Complete Left Result Date: 04/10/2024 EXAM: 3 OR MORE VIEW(S) XRAY OF THE LEFT ANKLE 04/10/2024 07:26:00 PM CLINICAL HISTORY: fall COMPARISON: None available. FINDINGS: BONES AND JOINTS: Status post partial resection of the fibula. No acute fracture or dislocation. SOFT TISSUES: The soft tissues are unremarkable. IMPRESSION: 1. No acute fracture or dislocation. Electronically signed by:  Norman Gatlin MD 04/10/2024 08:02 PM EST RP Workstation: [id number]   DG Chest 1 View Result Date: 04/10/2024 CLINICAL DATA:  Status post fall.  Leg and back pain.  Hypoglycemia. EXAM: CHEST  1 VIEW COMPARISON:  Chest radiograph 06/28/2013 FINDINGS: The cardiomediastinal contours are normal. Mild atelectasis/scarring at the left lung base. Pulmonary vasculature is normal. No consolidation, pleural effusion, or pneumothorax. No acute osseous abnormalities are seen. IMPRESSION: No active disease. Electronically Signed   By: Andrea Gasman M.D.   On: 04/10/2024 19:02   DG Pelvis 1-2 Views Result Date: 04/10/2024 CLINICAL DATA:  Status post fall with leg and back pain. EXAM: PELVIS - 1-2 VIEW COMPARISON:  None Available. FINDINGS: Left hip arthroplasty is intact were visualized. No evidence of acute pelvic fracture. No pubic symphyseal or sacroiliac diastasis. The pubic rami are intact. No focal bone abnormality. Surgical clips project over the left greater trochanter. IMPRESSION: No acute fracture of the pelvis. Left hip arthroplasty without complication. Electronically Signed   By: Andrea Gasman M.D.   On: 04/10/2024 19:01   DG Femur Min 2 Views Left Result Date: 04/10/2024 CLINICAL DATA:  Status post fall with leg pain. EXAM: LEFT FEMUR 2 VIEWS COMPARISON:  None Available. FINDINGS: Left hip arthroplasty in place. Portions of the screw seen adjacent to the distal femoral stem. No convincing periprosthetic fracture or lucency. Lucencies extending through cortical thickening about the medial upper femur are felt to represent nutrient channels. Knee alignment is maintained. Multiple surgical clips project over the greater trochanter and laterally. IMPRESSION: No acute femur fracture. Left hip arthroplasty in expected alignment. Electronically Signed   By: Andrea Gasman M.D.   On: 04/10/2024 19:00   DG Lumbar Spine Complete Result Date: 04/10/2024 CLINICAL DATA:  Status post fall with back and leg  pain. EXAM: LUMBAR SPINE - COMPLETE 4+ VIEW COMPARISON:  None Available. FINDINGS: Five non-rib-bearing lumbar vertebra. No acute fracture or compression deformity. Mild straightening of normal lordosis without listhesis. Mild multilevel degenerative disc disease with disc space narrowing and spurring. Mild L5-S1 facet hypertrophy. Aortic atherosclerosis. IMPRESSION: 1. No acute fracture or subluxation of the lumbar spine. 2. Mild multilevel degenerative disc disease and facet hypertrophy. Electronically Signed   By: Andrea Gasman M.D.   On: 04/10/2024 18:50   CT Head Wo Contrast Result Date: 04/10/2024 CLINICAL DATA:  Provided history: Head trauma, minor (Age >= 65y) EXAM: CT HEAD WITHOUT CONTRAST TECHNIQUE: Contiguous axial images were obtained from the base of the skull through the vertex without intravenous contrast. RADIATION DOSE REDUCTION: This exam was performed according to the departmental dose-optimization program which includes automated exposure control, adjustment of the mA and/or kV according to patient size and/or use of iterative reconstruction technique. COMPARISON:  Head CT 10/02/2023 FINDINGS: Brain: No intracranial hemorrhage, mass effect, or midline shift. No hydrocephalus. The basilar cisterns are patent. Right lacunar infarct in the right thalamus, unchanged. Mild periventricular chronic small vessel ischemia no evidence of territorial infarct or acute ischemia. No extra-axial or intracranial fluid collection. Vascular: Atherosclerosis of skullbase vasculature without hyperdense vessel or abnormal calcification. Skull: No fracture or focal lesion. Sinuses/Orbits: Scattered mucosal thickening of the paranasal sinuses. Partially included left maxillary sinus mucous retention cyst. No mastoid effusion. Other: None. IMPRESSION: 1. No acute intracranial abnormality. No skull fracture. 2. Chronic small vessel ischemia. Remote right thalamic lacunar infarct. Electronically Signed   By: Andrea Gasman M.D.   On: 04/10/2024 18:46     .Critical Care  Performed by: Freddi Hamilton, MD Authorized by: Freddi Hamilton, MD   Critical care provider statement:    Critical care time (minutes):  30   Critical care time was exclusive of:  Separately billable procedures and treating other patients   Critical care was necessary to treat or prevent imminent or life-threatening deterioration of the following conditions:  Endocrine crisis   Critical care was time spent personally by me on the following activities:  Development of treatment plan with patient or surrogate, discussions with consultants, evaluation of patient's response to treatment, examination of patient, ordering and review of laboratory studies, ordering and review of radiographic studies, ordering and performing treatments and interventions, pulse oximetry, re-evaluation of patient's condition and review of old charts    Medications Ordered in the ED  sodium chloride  flush (NS) 0.9 % injection 3 mL (has no administration in time range)  sodium chloride  flush (NS) 0.9 % injection 3 mL (has no administration in time range)  0.9 %  sodium chloride  infusion (has no administration in time range)  atorvastatin  (LIPITOR ) tablet 80 mg (has no administration in time range)  DULoxetine  (CYMBALTA ) DR capsule 60 mg (has no administration in time range)  pantoprazole  (PROTONIX ) EC tablet 40 mg (has no administration in time range)  alfuzosin (UROXATRAL) 24 hr tablet 10 mg (has no administration in time range)  apixaban  (ELIQUIS ) tablet 5 mg (has no administration in time range)  pregabalin  (LYRICA ) capsule 100 mg (has no administration in time range)  dextrose  10 % and 0.45 % NaCl infusion (has no administration in time range)  insulin  aspart (novoLOG ) injection 0-6 Units (has no administration in time range)  sodium chloride  flush (NS) 0.9 % injection 3 mL (has no administration in time range)  acetaminophen  (TYLENOL ) tablet 650 mg (has  no administration in time range)  Or  acetaminophen  (TYLENOL ) suppository 650 mg (has no administration in time range)  oxyCODONE  (Oxy IR/ROXICODONE ) immediate release tablet 5 mg (has no administration in time range)  fentaNYL  (SUBLIMAZE ) injection 12.5-50 mcg (has no administration in time range)  senna-docusate (Senokot-S) tablet 1 tablet (has no administration in time range)  prochlorperazine (COMPAZINE) injection 5 mg (has no administration in time range)  hydrALAZINE (APRESOLINE) tablet 25 mg (has no administration in time range)  fentaNYL  (SUBLIMAZE ) injection 50 mcg (50 mcg Intravenous Given 04/10/24 1740)  dextrose  50 % solution 50 mL (50 mLs Intravenous Given 04/10/24 1721)                                    Medical Decision Making Amount and/or Complexity of Data Reviewed Labs: ordered.    Details: Hypoglycemia.  No AKI Radiology: ordered and independent interpretation performed.    Details: No fractures. ECG/medicine tests: ordered and independent interpretation performed.    Details: No ischemia  Risk Prescription drug management. Decision regarding hospitalization.   Patient presents with recurrent episodes of hypoglycemia.  Transiently gets better with some dextrose  and then drops again.  After most recent D50 it has not dropped, will also order him a D10/half-normal normal saline infusion.  Unclear why this is occurring, his other workup is pretty much unremarkable.  He is complaining of some back leg pain but has no weakness appreciated, strong pulse appreciated on Doppler, and no numbness on exam.  I do not think this is an acute spinal cord emergency.  X-rays are unremarkable.  Will admit for observation given his recurrent hypoglycemia episodes.  Discussed with Dr. Charlton.     Final diagnoses:  Hypoglycemia    ED Discharge Orders     None          Freddi Hamilton, MD 04/10/24 2017

## 2024-04-10 NOTE — ED Notes (Signed)
 CCMD called.

## 2024-04-10 NOTE — ED Notes (Signed)
 Gave the pt. Some juice, turkey sandwich, and some crackers

## 2024-04-10 NOTE — ED Notes (Signed)
 CBG now 91 after D50, crackers, juice, and sandwich; EDP notified

## 2024-04-10 NOTE — ED Notes (Signed)
 Pt. Was shivering upon arrival; ED tech applied bair hugger

## 2024-04-10 NOTE — ED Notes (Signed)
 Patient transported to CT

## 2024-04-11 ENCOUNTER — Telehealth (HOSPITAL_COMMUNITY): Payer: Self-pay | Admitting: Pharmacy Technician

## 2024-04-11 ENCOUNTER — Other Ambulatory Visit (HOSPITAL_COMMUNITY): Payer: Self-pay

## 2024-04-11 LAB — GLUCOSE, CAPILLARY
Glucose-Capillary: 143 mg/dL — ABNORMAL HIGH (ref 70–99)
Glucose-Capillary: 156 mg/dL — ABNORMAL HIGH (ref 70–99)
Glucose-Capillary: 242 mg/dL — ABNORMAL HIGH (ref 70–99)
Glucose-Capillary: 257 mg/dL — ABNORMAL HIGH (ref 70–99)
Glucose-Capillary: 48 mg/dL — ABNORMAL LOW (ref 70–99)
Glucose-Capillary: 64 mg/dL — ABNORMAL LOW (ref 70–99)

## 2024-04-11 LAB — CBC
HCT: 30.7 % — ABNORMAL LOW (ref 39.0–52.0)
Hemoglobin: 10.9 g/dL — ABNORMAL LOW (ref 13.0–17.0)
MCH: 34.1 pg — ABNORMAL HIGH (ref 26.0–34.0)
MCHC: 35.5 g/dL (ref 30.0–36.0)
MCV: 95.9 fL (ref 80.0–100.0)
Platelets: 253 K/uL (ref 150–400)
RBC: 3.2 MIL/uL — ABNORMAL LOW (ref 4.22–5.81)
RDW: 12.1 % (ref 11.5–15.5)
WBC: 10.3 K/uL (ref 4.0–10.5)
nRBC: 0 % (ref 0.0–0.2)

## 2024-04-11 LAB — BASIC METABOLIC PANEL WITH GFR
Anion gap: 6 (ref 5–15)
BUN: 7 mg/dL — ABNORMAL LOW (ref 8–23)
CO2: 27 mmol/L (ref 22–32)
Calcium: 8.2 mg/dL — ABNORMAL LOW (ref 8.9–10.3)
Chloride: 99 mmol/L (ref 98–111)
Creatinine, Ser: 0.94 mg/dL (ref 0.61–1.24)
GFR, Estimated: >60 mL/min (ref >60)
Glucose, Bld: 40 mg/dL — CL (ref 70–99)
Potassium: 3.7 mmol/L (ref 3.5–5.1)
Sodium: 132 mmol/L — ABNORMAL LOW (ref 135–145)

## 2024-04-11 LAB — HEMOGLOBIN A1C
Hgb A1c MFr Bld: 8.2 % — ABNORMAL HIGH (ref 4.8–5.6)
Mean Plasma Glucose: 189 mg/dL

## 2024-04-11 LAB — MAGNESIUM: Magnesium: 1.6 mg/dL — ABNORMAL LOW (ref 1.7–2.4)

## 2024-04-11 LAB — CORTISOL-AM, BLOOD: Cortisol - AM: 6.9 ug/dL (ref 6.7–22.6)

## 2024-04-11 MED ORDER — INSULIN ASPART 100 UNIT/ML FLEXPEN: 0.0000 [IU] | PEN_INJECTOR | Freq: Every day | SUBCUTANEOUS | Status: AC

## 2024-04-11 MED ORDER — MAGNESIUM SULFATE 2 GM/50ML IV SOLN
2.0000 g | Freq: Once | INTRAVENOUS | Status: AC
  Administered 2024-04-11: 2 g via INTRAVENOUS
  Filled 2024-04-11: qty 50

## 2024-04-11 MED ORDER — LANTUS SOLOSTAR 100 UNIT/ML ~~LOC~~ SOPN: 15.0000 [IU] | PEN_INJECTOR | Freq: Every day | SUBCUTANEOUS | 2 refills | Status: AC

## 2024-04-11 MED ORDER — NOVOLOG FLEXPEN 100 UNIT/ML ~~LOC~~ SOPN: 8.0000 [IU] | PEN_INJECTOR | Freq: Three times a day (TID) | SUBCUTANEOUS | Status: AC

## 2024-04-11 NOTE — Progress Notes (Signed)
 Discharged education provided to the pt's daughter, PIV removed, CCMD notified, had no any concerns during d/c, no DMEs need.

## 2024-04-11 NOTE — Discharge Summary (Signed)
 Physician Discharge Summary   Gabriel Gomez FMW:[id number] DOB: 10/10/1960 DOA: 04/10/2024  PCP: Silver Lamar LABOR, MD  Admit date: 04/10/2024 Discharge date: 04/11/2024  Admitted From: Home Disposition:  Home Discharging physician: Alm Apo, MD Barriers to discharge: none  Recommendations at discharge: Referral placed to endocrine per patient request   Discharge Condition: stable CODE STATUS: Full  Diet recommendation:  Diet Orders (From admission, onward)     Start     Ordered   04/11/24 0000  Diet Carb Modified        04/11/24 1113   04/10/24 1654  Diet regular Room service appropriate? Yes; Fluid consistency: Thin  Diet effective now       Question Answer Comment  Room service appropriate? Yes   Fluid consistency: Thin      04/10/24 1653            Hospital Course: Gabriel Gomez is a 63 yo male with PMH DMII, COPD, PAF on Eliquis , tobacco use, history of CVA, nonobstructive CAD, PAD who presented with severe hypoglycemia and being found unresponsive by his wife. He has been having worsening episodes of nocturnal hypoglycemia. He was recently also recommended to increase Lantus  dose at home due to elevated A1c however had not yet done so.  However, with further questioning on admission, he does seem to do a fluctuating dose of Lantus  prior to bed depending on his glucose level (somewhere between 15 - 20 units at bedtime), however othertimes skips dose if glucose level is less than 200. He also oftentimes deals with morning hyperglycemia. Meal intake is also up and down at times and he's lost about 30 lbs he says this year.   He had further fluctuating glucose levels after admission.  As low as 34 with rebounds as high as 242.  Cortisol level low/normal, 6.9 mcg/dL. Repeat A1c still pending at discharge but recent A1c in Care Everywhere, 9.8% on 02/10/2024 which was up from 8.3% in May 2025.  Etiology of nocturnal hypoglycemia at home considered multifactorial from  his varying Lantus  doses, weight loss, meal intake, and possibly too much Novolog  at night. There seemed to be some ambiguity on how some doses were calculated.  After bedside discussion with patient, wife, and daughter, the consensus is that his daughter will help manage insulin  at discharge to ensure proper doses given.  We discussed modification to home regimen and patient asked for endocrinology referral as well.  Lantus  changed to morning-time administration starting at 15 units with ability to increase by 2 units every 2 days until target levels achieved.  Novolog  modified to prandial coverage with 8 units TIDPC if consumes > 50% meal (4 units if anything less). And a SSI novolog  for bedtime coverage.   Patient and family were comfortable with the plan.  Glucose levels were stable at discharge and patient was alert, awake, and coherent and able to tolerate food adequately.  The patient's acute and chronic medical conditions were treated accordingly. On day of discharge, patient was felt deemed stable for discharge. Patient/family member advised to call PCP or come back to ER if needed.   Principal Diagnosis: Hypoglycemia  Discharge Diagnoses: Active Hospital Problems   Diagnosis Date Noted   Hypoglycemia 04/10/2024    Priority: 1.   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/02/2023    Priority: 2.   History of stroke 04/10/2024   COPD (chronic obstructive pulmonary disease) (HCC)    Paroxysmal atrial fibrillation (HCC) 11/17/2023   PAD (peripheral artery  disease) 10/01/2022    Resolved Hospital Problems  No resolved problems to display.     Discharge Instructions     Ambulatory referral to Endocrinology   Complete by: As directed    Diet Carb Modified   Complete by: As directed    Increase activity slowly   Complete by: As directed       Allergies as of 04/11/2024       Reactions   Meloxicam  Swelling   Both legs   Morphine  Other (See  Comments)   Causes severe confusion and aggression   Tape Other (See Comments)   Not an allergy, daughter wants it added because it tears his skin        Medication List     PAUSE taking these medications    bethanechol 50 MG tablet Wait to take this until your doctor or other care provider tells you to start again. Commonly known as: URECHOLINE Take 50 mg by mouth 2 (two) times daily.       TAKE these medications    Accu-Chek Guide test strip Generic drug: glucose blood USE ONE strip FOUR TIMES DAILY   Accu-Chek Guide w/Device Kit To use to check glucose qd   Accu-Chek Softclix Lancets lancets Use as instructed   alfuzosin 10 MG 24 hr tablet Commonly known as: UROXATRAL Take 10 mg by mouth daily with breakfast.   apixaban  5 MG Tabs tablet Commonly known as: ELIQUIS  Take 1 tablet (5 mg total) by mouth 2 (two) times daily.   atorvastatin  80 MG tablet Commonly known as: LIPITOR  TAKE ONE TABLET BY MOUTH ONCE DAILY AT 6pm   DULoxetine  60 MG capsule Commonly known as: CYMBALTA  Take 60 mg by mouth daily.   Lantus  SoloStar 100 UNIT/ML Solostar Pen Generic drug: insulin  glargine Inject 15 Units into the skin daily. Increase by 2 units every 2 days if morning glucose level still > 200 What changed: See the new instructions.   nitroGLYCERIN  0.4 MG SL tablet Commonly known as: NITROSTAT  Place 1 tablet (0.4 mg total) under the tongue every 5 (five) minutes x 3 doses as needed for chest pain.   NovoLOG  FlexPen 100 UNIT/ML FlexPen Generic drug: insulin  aspart Inject 8 Units into the skin 3 (three) times daily after meals. Only if eats at least 50% of meal. If 50% or less, then only give 4 units What changed: See the new instructions.   insulin  aspart 100 UNIT/ML FlexPen Commonly known as: NOVOLOG  Inject 0-6 Units into the skin at bedtime. Check Blood Glucose (BG) and inject per scale: BG <150= 0 unit; BG 150-200= 0 unit; BG 201-250= 2 unit; BG 251-300= 3 unit; BG  301-350= 4 unit; BG 351-400= 5 unit; BG >400= 6 unit and Call Primary Care. What changed: You were already taking a medication with the same name, and this prescription was added. Make sure you understand how and when to take each.   pantoprazole  40 MG tablet Commonly known as: PROTONIX  Take 40 mg by mouth 2 (two) times daily.   pregabalin  100 MG capsule Commonly known as: LYRICA  Take 100 mg by mouth 2 (two) times daily.   Sure Comfort Pen Needles 32G X 4 MM Misc Generic drug: Insulin  Pen Needle AS DIRECTED FOUR TIMES DAILY   Ventolin  HFA 108 (90 Base) MCG/ACT inhaler Generic drug: albuterol  Inhale 2 puffs into the lungs every 6 (six) hours as needed for wheezing or shortness of breath.   ZYRTEC-D ALLERGY & SINUS PO Take 1 tablet by mouth  daily.        Allergies  Allergen Reactions   Meloxicam  Swelling    Both legs   Morphine  Other (See Comments)    Causes severe confusion and aggression   Tape Other (See Comments)    Not an allergy, daughter wants it added because it tears his skin    Consultations:   Procedures:   Discharge Exam: BP (!) 164/73 (BP Location: Left Arm)   Pulse 67   Temp 99.1 F (37.3 C) (Oral)   Resp 14   Ht 5' 10 (1.778 m)   Wt 79.4 kg   SpO2 99%   BMI 25.11 kg/m  Physical Exam Constitutional:      General: He is not in acute distress.    Appearance: Normal appearance.  HENT:     Head: Normocephalic and atraumatic.     Mouth/Throat:     Mouth: Mucous membranes are moist.  Eyes:     Extraocular Movements: Extraocular movements intact.  Cardiovascular:     Rate and Rhythm: Normal rate and regular rhythm.  Pulmonary:     Effort: Pulmonary effort is normal. No respiratory distress.     Breath sounds: Normal breath sounds. No wheezing.  Abdominal:     General: Bowel sounds are normal. There is no distension.     Palpations: Abdomen is soft.     Tenderness: There is no abdominal tenderness.  Musculoskeletal:        General: Normal  range of motion.     Cervical back: Normal range of motion and neck supple.  Skin:    General: Skin is warm and dry.  Neurological:     General: No focal deficit present.     Mental Status: He is alert.  Psychiatric:        Mood and Affect: Mood normal.        Behavior: Behavior normal.      The results of significant diagnostics from this hospitalization (including imaging, microbiology, ancillary and laboratory) are listed below for reference.   Microbiology: No results found for this or any previous visit (from the past 240 hours).   Labs: BNP (last 3 results) No results for input(s): BNP in the last 8760 hours. Basic Metabolic Panel: Recent Labs  Lab 04/10/24 1712 04/10/24 1724 04/11/24 0327  NA 129* 132* 132*  K 3.6 3.6 3.7  CL 96* 93* 99  CO2 26  --  27  GLUCOSE 34* 141* 40*  BUN 10 8 7*  CREATININE 0.89 1.00 0.94  CALCIUM  8.6*  --  8.2*  MG  --   --  1.6*   Liver Function Tests: Recent Labs  Lab 04/10/24 1712  AST 23  ALT 16  ALKPHOS 58  BILITOT 0.6  PROT 6.0*  ALBUMIN 3.2*   No results for input(s): LIPASE, AMYLASE in the last 168 hours. No results for input(s): AMMONIA in the last 168 hours. CBC: Recent Labs  Lab 04/10/24 1712 04/10/24 1724 04/11/24 0327  WBC 13.8*  --  10.3  NEUTROABS 10.9*  --   --   HGB 11.4* 11.2* 10.9*  HCT 32.7* 33.0* 30.7*  MCV 96.5  --  95.9  PLT 272  --  253   Cardiac Enzymes: No results for input(s): CKTOTAL, CKMB, CKMBINDEX, TROPONINI in the last 168 hours. BNP: Invalid input(s): POCBNP CBG: Recent Labs  Lab 04/11/24 0357 04/11/24 0416 04/11/24 0450 04/11/24 0823 04/11/24 1135  GLUCAP 48* 64* 143* 156* 257*   D-Dimer No results for input(s):  DDIMER in the last 72 hours. Hgb A1c No results for input(s): HGBA1C in the last 72 hours. Lipid Profile No results for input(s): CHOL, HDL, LDLCALC, TRIG, CHOLHDL, LDLDIRECT in the last 72 hours. Thyroid  function studies No  results for input(s): TSH, T4TOTAL, T3FREE, THYROIDAB in the last 72 hours.  Invalid input(s): FREET3 Anemia work up No results for input(s): VITAMINB12, FOLATE, FERRITIN, TIBC, IRON, RETICCTPCT in the last 72 hours. Urinalysis    Component Value Date/Time   COLORURINE STRAW (A) 04/10/2024 1716   APPEARANCEUR CLEAR 04/10/2024 1716   LABSPEC 1.006 04/10/2024 1716   PHURINE 7.0 04/10/2024 1716   GLUCOSEU 150 (A) 04/10/2024 1716   HGBUR SMALL (A) 04/10/2024 1716   BILIRUBINUR NEGATIVE 04/10/2024 1716   BILIRUBINUR Negative 11/06/2019 0858   KETONESUR NEGATIVE 04/10/2024 1716   PROTEINUR 100 (A) 04/10/2024 1716   UROBILINOGEN negative (A) 11/06/2019 0858   NITRITE NEGATIVE 04/10/2024 1716   LEUKOCYTESUR NEGATIVE 04/10/2024 1716   Sepsis Labs Recent Labs  Lab 04/10/24 1712 04/11/24 0327  WBC 13.8* 10.3   Microbiology No results found for this or any previous visit (from the past 240 hours).  Procedures/Studies: DG Ankle Complete Left Result Date: 04/10/2024 EXAM: 3 OR MORE VIEW(S) XRAY OF THE LEFT ANKLE 04/10/2024 07:26:00 PM CLINICAL HISTORY: fall COMPARISON: None available. FINDINGS: BONES AND JOINTS: Status post partial resection of the fibula. No acute fracture or dislocation. SOFT TISSUES: The soft tissues are unremarkable. IMPRESSION: 1. No acute fracture or dislocation. Electronically signed by: Norman Gatlin MD 04/10/2024 08:02 PM EST RP Workstation: [id number]   DG Chest 1 View Result Date: 04/10/2024 CLINICAL DATA:  Status post fall.  Leg and back pain.  Hypoglycemia. EXAM: CHEST  1 VIEW COMPARISON:  Chest radiograph 06/28/2013 FINDINGS: The cardiomediastinal contours are normal. Mild atelectasis/scarring at the left lung base. Pulmonary vasculature is normal. No consolidation, pleural effusion, or pneumothorax. No acute osseous abnormalities are seen. IMPRESSION: No active disease. Electronically Signed   By: Andrea Gasman M.D.   On: 04/10/2024  19:02   DG Pelvis 1-2 Views Result Date: 04/10/2024 CLINICAL DATA:  Status post fall with leg and back pain. EXAM: PELVIS - 1-2 VIEW COMPARISON:  None Available. FINDINGS: Left hip arthroplasty is intact were visualized. No evidence of acute pelvic fracture. No pubic symphyseal or sacroiliac diastasis. The pubic rami are intact. No focal bone abnormality. Surgical clips project over the left greater trochanter. IMPRESSION: No acute fracture of the pelvis. Left hip arthroplasty without complication. Electronically Signed   By: Andrea Gasman M.D.   On: 04/10/2024 19:01   DG Femur Min 2 Views Left Result Date: 04/10/2024 CLINICAL DATA:  Status post fall with leg pain. EXAM: LEFT FEMUR 2 VIEWS COMPARISON:  None Available. FINDINGS: Left hip arthroplasty in place. Portions of the screw seen adjacent to the distal femoral stem. No convincing periprosthetic fracture or lucency. Lucencies extending through cortical thickening about the medial upper femur are felt to represent nutrient channels. Knee alignment is maintained. Multiple surgical clips project over the greater trochanter and laterally. IMPRESSION: No acute femur fracture. Left hip arthroplasty in expected alignment. Electronically Signed   By: Andrea Gasman M.D.   On: 04/10/2024 19:00   DG Lumbar Spine Complete Result Date: 04/10/2024 CLINICAL DATA:  Status post fall with back and leg pain. EXAM: LUMBAR SPINE - COMPLETE 4+ VIEW COMPARISON:  None Available. FINDINGS: Five non-rib-bearing lumbar vertebra. No acute fracture or compression deformity. Mild straightening of normal lordosis without listhesis. Mild multilevel degenerative disc  disease with disc space narrowing and spurring. Mild L5-S1 facet hypertrophy. Aortic atherosclerosis. IMPRESSION: 1. No acute fracture or subluxation of the lumbar spine. 2. Mild multilevel degenerative disc disease and facet hypertrophy. Electronically Signed   By: Andrea Gasman M.D.   On: 04/10/2024 18:50    CT Head Wo Contrast Result Date: 04/10/2024 CLINICAL DATA:  Provided history: Head trauma, minor (Age >= 65y) EXAM: CT HEAD WITHOUT CONTRAST TECHNIQUE: Contiguous axial images were obtained from the base of the skull through the vertex without intravenous contrast. RADIATION DOSE REDUCTION: This exam was performed according to the departmental dose-optimization program which includes automated exposure control, adjustment of the mA and/or kV according to patient size and/or use of iterative reconstruction technique. COMPARISON:  Head CT 10/02/2023 FINDINGS: Brain: No intracranial hemorrhage, mass effect, or midline shift. No hydrocephalus. The basilar cisterns are patent. Right lacunar infarct in the right thalamus, unchanged. Mild periventricular chronic small vessel ischemia no evidence of territorial infarct or acute ischemia. No extra-axial or intracranial fluid collection. Vascular: Atherosclerosis of skullbase vasculature without hyperdense vessel or abnormal calcification. Skull: No fracture or focal lesion. Sinuses/Orbits: Scattered mucosal thickening of the paranasal sinuses. Partially included left maxillary sinus mucous retention cyst. No mastoid effusion. Other: None. IMPRESSION: 1. No acute intracranial abnormality. No skull fracture. 2. Chronic small vessel ischemia. Remote right thalamic lacunar infarct. Electronically Signed   By: Andrea Gasman M.D.   On: 04/10/2024 18:46     Time coordinating discharge: Over 30 minutes    Alm Apo, MD  [facility] 04/11/2024, 4:18 PM

## 2024-04-11 NOTE — Discharge Instructions (Signed)

## 2024-04-11 NOTE — Care Management Obs Status (Signed)
 MEDICARE OBSERVATION STATUS NOTIFICATION   Patient Details  Name: Gabriel Gomez MRN: [id number] Date of Birth: 10-24-1960   Medicare Observation Status Notification Given:  Yes    Jennie Laneta Dragon 04/11/2024, 11:33 AM

## 2024-04-11 NOTE — Telephone Encounter (Signed)
 Patient Product/process Development Scientist completed.    The patient is insured through [facility] Patient has Medicare and is not eligible for a copay card, but may be able to apply for patient assistance or Medicare RX Payment Plan (Patient Must reach out to their plan, if eligible for payment plan), if available.    Ran test claim for Dexcom G7 Sensor and the current 30 day co-pay is $0.00.  Ran test claim for Freestyle Libre 3 Plus Sensor and the current 30 day co-pay is $0.00.   This test claim was processed through  [facility]- copay amounts may vary at other pharmacies due to pharmacy/plan contracts, or as the patient moves through the different stages of their insurance plan.     Reyes Sharps, CPHT Pharmacy Technician Patient Advocate Specialist [facility] Patient Advocate Team Direct Number: [phone]  Fax: [phone]

## 2024-04-11 NOTE — Hospital Course (Signed)
 Mr. Gabriel Gomez is a 63 yo male with PMH DMII, COPD, PAF on Eliquis , tobacco use, history of CVA, nonobstructive CAD, PAD who presented with severe hypoglycemia and being found unresponsive by his wife. He has been having worsening episodes of nocturnal hypoglycemia. He was recently also recommended to increase Lantus  dose at home due to elevated A1c however had not yet done so.  However, with further questioning on admission, he does seem to do a fluctuating dose of Lantus  prior to bed depending on his glucose level (somewhere between 15 - 20 units at bedtime), however othertimes skips dose if glucose level is less than 200. He also oftentimes deals with morning hyperglycemia. Meal intake is also up and down at times and he's lost about 30 lbs he says this year.   He had further fluctuating glucose levels after admission.  As low as 34 with rebounds as high as 242.  Cortisol level low/normal, 6.9 mcg/dL. Repeat A1c still pending at discharge but recent A1c in Care Everywhere, 9.8% on 02/10/2024 which was up from 8.3% in May 2025.  Etiology of nocturnal hypoglycemia at home considered multifactorial from his varying Lantus  doses, weight loss, meal intake, and possibly too much Novolog  at night. There seemed to be some ambiguity on how some doses were calculated.  After bedside discussion with patient, wife, and daughter, the consensus is that his daughter will help manage insulin  at discharge to ensure proper doses given.  We discussed modification to home regimen and patient asked for endocrinology referral as well.  Lantus  changed to morning-time administration starting at 15 units with ability to increase by 2 units every 2 days until target levels achieved.  Novolog  modified to prandial coverage with 8 units TIDPC if consumes > 50% meal (4 units if anything less). And a SSI novolog  for bedtime coverage.   Patient and family were comfortable with the plan.  Glucose levels were stable at discharge and  patient was alert, awake, and coherent and able to tolerate food adequately.

## 2024-04-11 NOTE — Significant Event (Signed)
 Notified of Hypoglycemic Event at 0358 by NT: CBG: 48  Treatment: 8 oz juice/soda  Symptoms: Shaky and Hungry  Follow-up CBG: Time:0419 CBG Result:64  Possible Reasons for Event: Unknown  Comments/MD notified:Hypoglycemic Protocol repeated   Hypoglycemic Event at 0419:  CBG: 64  Treatment: 8 oz juice/soda  Symptoms: None  Follow-up CBG: Time:0450 CBG Result:143  Possible Reasons for Event: Unknown  Comments/MD notified: No additional interventions required at this time; monitoring CBG Q4H is ongoing    Tinnie Puna

## 2024-04-20 ENCOUNTER — Ambulatory Visit: Admitting: Podiatry

## 2024-04-20 VITALS — BP 147/67 | HR 75 | Temp 97.3°F

## 2024-04-20 DIAGNOSIS — E1151 Type 2 diabetes mellitus with diabetic peripheral angiopathy without gangrene: Secondary | ICD-10-CM | POA: Diagnosis not present

## 2024-04-20 DIAGNOSIS — M79675 Pain in left toe(s): Secondary | ICD-10-CM | POA: Diagnosis not present

## 2024-04-20 DIAGNOSIS — M79674 Pain in right toe(s): Secondary | ICD-10-CM

## 2024-04-20 DIAGNOSIS — B351 Tinea unguium: Secondary | ICD-10-CM | POA: Diagnosis not present

## 2024-04-20 NOTE — Progress Notes (Signed)
 Subjective:  Patient ID: Gabriel Gomez, male    DOB: 1961/03/17,  MRN: [id number]  Gabriel Gomez presents to clinic today for:  Chief Complaint  Patient presents with   [facility]    [facility] no callous, will you check his pulse, his feet feel wet and cold. A1c was 8.5 in Dec.  Eliquis    Patient notes nails are thick, discolored, elongated and painful in shoegear when trying to ambulate.  He notes he has an appointment with a new cardiologist at the end of January.  States that his blood pressure has been very high and his blood sugar has been very low.  He has not been feeling well at all.  He stated that they had no other available appointment sooner than this.  He was amenable to allowing us  to take his vital signs today.  PCP is Silver Lamar LABOR, MD.  Past Medical History:  Diagnosis Date   Abnormal laboratory test 11/15/2019   Accelerated idioventricular rhythm 10/02/2023   Acute laryngopharyngitis 05/21/2020   Acute left-sided low back pain with left-sided sciatica 11/06/2019   Acute pain of right shoulder 09/25/2022   Alcohol use 11/17/2023   Arthritis    Asymmetric SNHL (sensorineural hearing loss) 02/25/2022   Asymptomatic microscopic hematuria 11/06/2019   Atherosclerosis of coronary artery 11/17/2023   Balance problem 07/06/2023   Body mass index (BMI) 27.0-27.9, adult 08/02/2020   Bulging lumbar disc 03/07/2020   Chest pain of uncertain etiology 11/17/2023   Chronic health problem 10/02/2023   Chronic pain 01/07/2023   COPD (chronic obstructive pulmonary disease) (HCC)    Coronary artery disease    Diabetes mellitus without complication (HCC)    Dysfunction of both eustachian tubes 04/23/2022   Dysuria 04/27/2022   Essential hypertension, benign 08/03/2019   Gait disturbance 07/06/2023   GERD (gastroesophageal reflux disease)    Hypertension associated with diabetes (HCC) 10/02/2023   Hypokalemia 10/02/2023   Hypomagnesemia 06/29/2023   Hyponatremia 05/29/2018    Left ankle sprain 10/02/2023   Left pontine CVA (HCC) 04/23/2022   Chronic finding     Leukocytosis 05/29/2018   Long term current use of insulin  (HCC) 11/17/2023   Low back pain with sciatica 06/10/2020   Lumbar radiculopathy 03/07/2020   Mixed dyslipidemia 04/21/2022   Mixed hyperlipidemia 08/03/2019   Nausea and vomiting 11/17/2023   Need for tetanus, diphtheria, and acellular pertussis (Tdap) vaccine 11/06/2019   Orthostatic syncope 11/25/2023   Osteonecrosis, unspecified (HCC) 11/17/2023   PAD (peripheral artery disease) 10/01/2022   Paroxysmal atrial fibrillation (HCC) 11/17/2023   Peripheral polyneuropathy 07/01/2020   Polyneuropathy due to type 2 diabetes mellitus (HCC) 04/21/2022   Prostate cancer screening 11/15/2019   Respiratory infection 03/11/2022   Rib pain on right side 05/11/2022   Right flank pain 04/21/2022   Right knee meniscal tear 10/21/2021   Right leg weakness 07/06/2023   Syncope and collapse 10/02/2023   Tobacco user 11/17/2023   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/02/2023   Type 2 diabetes mellitus without complication, with long-term current use of insulin  (HCC) 08/03/2019   Unstable angina (HCC) 05/28/2018   Urinary urgency 04/27/2022   Vitamin D  deficiency 06/29/2023   Past Surgical History:  Procedure Laterality Date   CHONDROPLASTY Right 12/15/2021   Procedure: MEDIAL COMPARTMENT CHONDROPLASTY;  Surgeon: Barbarann Oneil BROCKS, MD;  Location: [facility];  Service: Orthopedics;  Laterality: Right;   JOINT REPLACEMENT Left    KNEE ARTHROSCOPY Right 12/15/2021   Procedure: RIGHT  KNEE ARTHROSCOPY;  Surgeon: Barbarann Oneil BROCKS, MD;  Location: [facility];  Service: Orthopedics;  Laterality: Right;   LEFT HEART CATH AND CORONARY ANGIOGRAPHY N/A 05/30/2018   Procedure: LEFT HEART CATH AND CORONARY ANGIOGRAPHY;  Surgeon: Wonda Sharper, MD;  Location: [facility] INVASIVE CV LAB;  Service: Cardiovascular;  Laterality:  N/A;   liver laceration  1979   Allergies[1]  Review of Systems: Negative except as noted in the HPI.  Objective:  Vitals:   04/20/24 1345  BP: (!) 147/67  Pulse: 75  Temp: (!) 97.3 F (36.3 C)    Gabriel Gomez is a pleasant 63 y.o. male in NAD. AAO x 3.  Vascular Examination: Capillary refill time is 3-5 seconds to toes bilateral.  2/4 palpable pedal pulses right foot, 1/4 left foot. Digital hair present b/l.  Skin temperature gradient WNL b/l. No varicosities b/l. No cyanosis noted b/l.  No appreciable edema  Dermatological Examination: Pedal skin with normal turgor, texture and tone b/l. No open wounds. No interdigital macerations b/l. Toenails x10 are 3mm thick, discolored, dystrophic with subungual debris. There is pain with compression of the nail plates.  They are elongated x10     Latest Ref Rng & Units 04/11/2024    3:27 AM 10/03/2023    7:17 AM  Hemoglobin A1C  Hemoglobin-A1c 4.8 - 5.6 % 8.2  8.4    Assessment/Plan: 1. Pain due to onychomycosis of toenails of both feet   2. Type II diabetes mellitus with peripheral circulatory disorder (HCC)    The mycotic toenails were sharply debrided x10 with sterile nail nippers and a power debriding burr to decrease bulk/thickness and length.    After obtaining vitals we did make an attempt to call the phone number he gave us  for the [facility] cardiologist in [facility].  The number was incorrect that he provided.  Encourage patient to call their office directly once he gets the correct number to explain his current situation.  Return in about 3 months (around 07/19/2024) for [facility].   Awanda CHARM Imperial, DPM, FACFAS [facility]     [address] [place] [address]                Office [phone]  Fax [phone]    [1]  Allergies Allergen Reactions   Meloxicam  Swelling    Both legs   Morphine  Other (See Comments)    Causes severe confusion and  aggression   Tape Other (See Comments)    Not an allergy, daughter wants it added because it tears his skin

## 2024-06-08 ENCOUNTER — Ambulatory Visit: Admitting: Cardiology

## 2024-07-20 ENCOUNTER — Ambulatory Visit: Admitting: Podiatry

## 2024-07-26 ENCOUNTER — Ambulatory Visit: Admitting: Cardiology
